# Patient Record
Sex: Male | Born: 1945 | Race: White | Hispanic: No | Marital: Single | Smoking: Former smoker
Health system: Southern US, Community
[De-identification: ages and names within clinical notes are randomized; demographics above are authoritative.]

## PROBLEM LIST (undated history)

## (undated) DIAGNOSIS — E113293 Type 2 diabetes mellitus with mild nonproliferative diabetic retinopathy without macular edema, bilateral: Secondary | ICD-10-CM

## (undated) DIAGNOSIS — Z7901 Long term (current) use of anticoagulants: Secondary | ICD-10-CM

## (undated) DIAGNOSIS — I351 Nonrheumatic aortic (valve) insufficiency: Secondary | ICD-10-CM

## (undated) DIAGNOSIS — N401 Enlarged prostate with lower urinary tract symptoms: Secondary | ICD-10-CM

## (undated) DIAGNOSIS — H9313 Tinnitus, bilateral: Secondary | ICD-10-CM

## (undated) DIAGNOSIS — I340 Nonrheumatic mitral (valve) insufficiency: Secondary | ICD-10-CM

## (undated) DIAGNOSIS — Z973 Presence of spectacles and contact lenses: Secondary | ICD-10-CM

## (undated) DIAGNOSIS — I444 Left anterior fascicular block: Secondary | ICD-10-CM

## (undated) DIAGNOSIS — I4891 Unspecified atrial fibrillation: Secondary | ICD-10-CM

## (undated) DIAGNOSIS — E782 Mixed hyperlipidemia: Secondary | ICD-10-CM

## (undated) DIAGNOSIS — I480 Paroxysmal atrial fibrillation: Secondary | ICD-10-CM

## (undated) DIAGNOSIS — I452 Bifascicular block: Secondary | ICD-10-CM

## (undated) DIAGNOSIS — E119 Type 2 diabetes mellitus without complications: Secondary | ICD-10-CM

## (undated) DIAGNOSIS — I441 Atrioventricular block, second degree: Secondary | ICD-10-CM

## (undated) DIAGNOSIS — I472 Ventricular tachycardia, unspecified: Secondary | ICD-10-CM

## (undated) DIAGNOSIS — I4729 Other ventricular tachycardia: Secondary | ICD-10-CM

## (undated) DIAGNOSIS — R011 Cardiac murmur, unspecified: Secondary | ICD-10-CM

## (undated) DIAGNOSIS — I10 Essential (primary) hypertension: Secondary | ICD-10-CM

## (undated) DIAGNOSIS — I2510 Atherosclerotic heart disease of native coronary artery without angina pectoris: Secondary | ICD-10-CM

## (undated) DIAGNOSIS — I4510 Unspecified right bundle-branch block: Secondary | ICD-10-CM

## (undated) DIAGNOSIS — E785 Hyperlipidemia, unspecified: Secondary | ICD-10-CM

## (undated) HISTORY — DX: Unspecified right bundle-branch block: I45.10

## (undated) HISTORY — PX: LUMBAR DISC SURGERY: SHX700

## (undated) HISTORY — DX: Nonrheumatic aortic (valve) insufficiency: I35.1

## (undated) HISTORY — DX: Essential (primary) hypertension: I10

## (undated) HISTORY — DX: Ventricular tachycardia, unspecified: I47.20

## (undated) HISTORY — DX: Atrioventricular block, second degree: I44.1

## (undated) HISTORY — DX: Hyperlipidemia, unspecified: E78.5

## (undated) HISTORY — DX: Left anterior fascicular block: I44.4

## (undated) HISTORY — DX: Type 2 diabetes mellitus without complications: E11.9

## (undated) HISTORY — DX: Nonrheumatic mitral (valve) insufficiency: I34.0

## (undated) HISTORY — DX: Unspecified atrial fibrillation: I48.91

## (undated) HISTORY — DX: Ventricular tachycardia: I47.2

## (undated) HISTORY — PX: BACK SURGERY: SHX140

## (undated) HISTORY — DX: Other ventricular tachycardia: I47.29

---

## 2004-03-17 ENCOUNTER — Ambulatory Visit (HOSPITAL_COMMUNITY): Admission: RE | Admit: 2004-03-17 | Discharge: 2004-03-17 | Payer: Self-pay | Admitting: Orthopedic Surgery

## 2004-11-29 ENCOUNTER — Ambulatory Visit (HOSPITAL_COMMUNITY): Admission: RE | Admit: 2004-11-29 | Discharge: 2004-11-29 | Payer: Self-pay | Admitting: Orthopedic Surgery

## 2007-02-28 HISTORY — PX: NM MYOCAR PERF WALL MOTION: HXRAD629

## 2010-06-11 DIAGNOSIS — Z8679 Personal history of other diseases of the circulatory system: Secondary | ICD-10-CM

## 2010-06-11 DIAGNOSIS — I441 Atrioventricular block, second degree: Secondary | ICD-10-CM

## 2010-06-11 DIAGNOSIS — I252 Old myocardial infarction: Secondary | ICD-10-CM

## 2010-06-11 HISTORY — DX: Old myocardial infarction: I25.2

## 2010-06-11 HISTORY — DX: Atrioventricular block, second degree: I44.1

## 2010-06-11 HISTORY — DX: Personal history of other diseases of the circulatory system: Z86.79

## 2010-06-28 ENCOUNTER — Inpatient Hospital Stay (HOSPITAL_COMMUNITY): Admission: EM | Admit: 2010-06-28 | Discharge: 2010-07-02 | Payer: Self-pay | Admitting: Pediatrics

## 2010-06-30 HISTORY — PX: CARDIAC CATHETERIZATION: SHX172

## 2010-07-21 ENCOUNTER — Ambulatory Visit (HOSPITAL_COMMUNITY): Admission: RE | Admit: 2010-07-21 | Discharge: 2010-07-22 | Payer: Self-pay | Admitting: Cardiovascular Disease

## 2010-07-21 DIAGNOSIS — Z950 Presence of cardiac pacemaker: Secondary | ICD-10-CM

## 2010-07-21 HISTORY — DX: Presence of cardiac pacemaker: Z95.0

## 2010-07-21 HISTORY — PX: PERMANENT PACEMAKER INSERTION: SHX6023

## 2010-09-14 ENCOUNTER — Ambulatory Visit
Admission: RE | Admit: 2010-09-14 | Discharge: 2010-09-14 | Payer: Self-pay | Source: Home / Self Care | Attending: Cardiothoracic Surgery | Admitting: Cardiothoracic Surgery

## 2010-09-28 ENCOUNTER — Ambulatory Visit
Admission: RE | Admit: 2010-09-28 | Discharge: 2010-09-28 | Payer: Self-pay | Source: Home / Self Care | Attending: Cardiothoracic Surgery | Admitting: Cardiothoracic Surgery

## 2010-10-05 LAB — CBC
HCT: 47.9 % (ref 39.0–52.0)
Hemoglobin: 16 g/dL (ref 13.0–17.0)
MCH: 30 pg (ref 26.0–34.0)
MCHC: 33.4 g/dL (ref 30.0–36.0)
MCV: 89.9 fL (ref 78.0–100.0)
Platelets: 210 10*3/uL (ref 150–400)
RBC: 5.33 MIL/uL (ref 4.22–5.81)
RDW: 12.9 % (ref 11.5–15.5)
WBC: 7.4 10*3/uL (ref 4.0–10.5)

## 2010-10-05 LAB — URINALYSIS, ROUTINE W REFLEX MICROSCOPIC
Hgb urine dipstick: NEGATIVE
Ketones, ur: 15 mg/dL — AB
Nitrite: NEGATIVE
Protein, ur: NEGATIVE mg/dL
Specific Gravity, Urine: 1.027 (ref 1.005–1.030)
Urine Glucose, Fasting: NEGATIVE mg/dL
Urobilinogen, UA: 1 mg/dL (ref 0.0–1.0)
pH: 6 (ref 5.0–8.0)

## 2010-10-05 LAB — PROTIME-INR
INR: 1.12 (ref 0.00–1.49)
Prothrombin Time: 14.6 seconds (ref 11.6–15.2)

## 2010-10-05 LAB — BLOOD GAS, ARTERIAL
Acid-base deficit: 0.3 mmol/L (ref 0.0–2.0)
Bicarbonate: 23.6 mEq/L (ref 20.0–24.0)
Drawn by: 206361
FIO2: 0.21 %
O2 Saturation: 96.7 %
Patient temperature: 98.6
TCO2: 24.7 mmol/L (ref 0–100)
pCO2 arterial: 37.2 mmHg (ref 35.0–45.0)
pH, Arterial: 7.419 (ref 7.350–7.450)
pO2, Arterial: 82.6 mmHg (ref 80.0–100.0)

## 2010-10-05 LAB — SURGICAL PCR SCREEN
MRSA, PCR: NEGATIVE
Staphylococcus aureus: NEGATIVE

## 2010-10-05 LAB — APTT: aPTT: 31 seconds (ref 24–37)

## 2010-10-05 LAB — COMPREHENSIVE METABOLIC PANEL
ALT: 19 U/L (ref 0–53)
AST: 27 U/L (ref 0–37)
Albumin: 4.1 g/dL (ref 3.5–5.2)
Alkaline Phosphatase: 51 U/L (ref 39–117)
BUN: 13 mg/dL (ref 6–23)
CO2: 21 mEq/L (ref 19–32)
Calcium: 9.2 mg/dL (ref 8.4–10.5)
Chloride: 109 mEq/L (ref 96–112)
Creatinine, Ser: 0.82 mg/dL (ref 0.4–1.5)
GFR calc Af Amer: >60 mL/min (ref >60)
GFR calc non Af Amer: >60 mL/min (ref >60)
Glucose, Bld: 83 mg/dL (ref 70–99)
Potassium: 4.2 mEq/L (ref 3.5–5.1)
Sodium: 140 mEq/L (ref 135–145)
Total Bilirubin: 0.8 mg/dL (ref 0.3–1.2)
Total Protein: 6.5 g/dL (ref 6.0–8.3)

## 2010-10-05 LAB — ABO/RH: ABO/RH(D): A POS

## 2010-10-05 LAB — HEMOGLOBIN A1C
Hgb A1c MFr Bld: 5.5 % (ref <5.7)
Mean Plasma Glucose: 111 mg/dL (ref <117)

## 2010-10-06 ENCOUNTER — Inpatient Hospital Stay (HOSPITAL_COMMUNITY)
Admission: RE | Admit: 2010-10-06 | Discharge: 2010-10-11 | Payer: Self-pay | Source: Home / Self Care | Attending: Cardiothoracic Surgery | Admitting: Cardiothoracic Surgery

## 2010-10-06 ENCOUNTER — Ambulatory Visit
Admission: RE | Admit: 2010-10-06 | Discharge: 2010-10-06 | Payer: Self-pay | Source: Home / Self Care | Attending: Cardiothoracic Surgery | Admitting: Cardiothoracic Surgery

## 2010-10-06 HISTORY — PX: AORTIC VALVE REPLACEMENT: SHX41

## 2010-10-06 LAB — POCT I-STAT 3, ART BLOOD GAS (G3+)
Acid-base deficit: 2 mmol/L (ref 0.0–2.0)
Acid-base deficit: 1 mmol/L (ref 0.0–2.0)
Acid-base deficit: 4 mmol/L — ABNORMAL HIGH (ref 0.0–2.0)
Acid-base deficit: 3 mmol/L — ABNORMAL HIGH (ref 0.0–2.0)
Acid-base deficit: 1 mmol/L (ref 0.0–2.0)
Bicarbonate: 23.9 mEq/L (ref 20.0–24.0)
Bicarbonate: 24.9 mEq/L — ABNORMAL HIGH (ref 20.0–24.0)
Bicarbonate: 22.1 mEq/L (ref 20.0–24.0)
Bicarbonate: 22.4 mEq/L (ref 20.0–24.0)
Bicarbonate: 24.6 mEq/L — ABNORMAL HIGH (ref 20.0–24.0)
O2 Saturation: 100 %
O2 Saturation: 100 %
O2 Saturation: 92 %
O2 Saturation: 98 %
O2 Saturation: 98 %
Patient temperature: 35.1
Patient temperature: 38.5
Patient temperature: 38.4
TCO2: 25 mmol/L (ref 0–100)
TCO2: 26 mmol/L (ref 0–100)
TCO2: 23 mmol/L (ref 0–100)
TCO2: 24 mmol/L (ref 0–100)
TCO2: 26 mmol/L (ref 0–100)
pCO2 arterial: 42.9 mmHg (ref 35.0–45.0)
pCO2 arterial: 43.8 mmHg (ref 35.0–45.0)
pCO2 arterial: 38.1 mmHg (ref 35.0–45.0)
pCO2 arterial: 43.4 mmHg (ref 35.0–45.0)
pCO2 arterial: 45.1 mmHg — ABNORMAL HIGH (ref 35.0–45.0)
pH, Arterial: 7.354 (ref 7.350–7.450)
pH, Arterial: 7.363 (ref 7.350–7.450)
pH, Arterial: 7.363 (ref 7.350–7.450)
pH, Arterial: 7.329 — ABNORMAL LOW (ref 7.350–7.450)
pH, Arterial: 7.351 (ref 7.350–7.450)
pO2, Arterial: 331 mmHg — ABNORMAL HIGH (ref 80.0–100.0)
pO2, Arterial: 238 mmHg — ABNORMAL HIGH (ref 80.0–100.0)
pO2, Arterial: 61 mmHg — ABNORMAL LOW (ref 80.0–100.0)
pO2, Arterial: 115 mmHg — ABNORMAL HIGH (ref 80.0–100.0)
pO2, Arterial: 111 mmHg — ABNORMAL HIGH (ref 80.0–100.0)

## 2010-10-06 LAB — CBC
HCT: 38.2 % — ABNORMAL LOW (ref 39.0–52.0)
HCT: 30.9 % — ABNORMAL LOW (ref 39.0–52.0)
Hemoglobin: 12.8 g/dL — ABNORMAL LOW (ref 13.0–17.0)
Hemoglobin: 10.5 g/dL — ABNORMAL LOW (ref 13.0–17.0)
MCH: 30 pg (ref 26.0–34.0)
MCH: 30.9 pg (ref 26.0–34.0)
MCHC: 33.5 g/dL (ref 30.0–36.0)
MCHC: 34 g/dL (ref 30.0–36.0)
MCV: 89.7 fL (ref 78.0–100.0)
MCV: 90.9 fL (ref 78.0–100.0)
Platelets: 126 10*3/uL — ABNORMAL LOW (ref 150–400)
Platelets: 134 10*3/uL — ABNORMAL LOW (ref 150–400)
RBC: 4.26 MIL/uL (ref 4.22–5.81)
RBC: 3.4 MIL/uL — ABNORMAL LOW (ref 4.22–5.81)
RDW: 12.8 % (ref 11.5–15.5)
RDW: 12.9 % (ref 11.5–15.5)
WBC: 11.8 10*3/uL — ABNORMAL HIGH (ref 4.0–10.5)
WBC: 12 10*3/uL — ABNORMAL HIGH (ref 4.0–10.5)

## 2010-10-06 LAB — POCT I-STAT 4, (NA,K, GLUC, HGB,HCT)
Glucose, Bld: 133 mg/dL — ABNORMAL HIGH (ref 70–99)
Glucose, Bld: 110 mg/dL — ABNORMAL HIGH (ref 70–99)
Glucose, Bld: 95 mg/dL (ref 70–99)
Glucose, Bld: 110 mg/dL — ABNORMAL HIGH (ref 70–99)
Glucose, Bld: 126 mg/dL — ABNORMAL HIGH (ref 70–99)
Glucose, Bld: 117 mg/dL — ABNORMAL HIGH (ref 70–99)
HCT: 43 % (ref 39.0–52.0)
HCT: 33 % — ABNORMAL LOW (ref 39.0–52.0)
HCT: 39 % (ref 39.0–52.0)
HCT: 33 % — ABNORMAL LOW (ref 39.0–52.0)
HCT: 32 % — ABNORMAL LOW (ref 39.0–52.0)
HCT: 37 % — ABNORMAL LOW (ref 39.0–52.0)
Hemoglobin: 14.6 g/dL (ref 13.0–17.0)
Hemoglobin: 11.2 g/dL — ABNORMAL LOW (ref 13.0–17.0)
Hemoglobin: 13.3 g/dL (ref 13.0–17.0)
Hemoglobin: 11.2 g/dL — ABNORMAL LOW (ref 13.0–17.0)
Hemoglobin: 10.9 g/dL — ABNORMAL LOW (ref 13.0–17.0)
Hemoglobin: 12.6 g/dL — ABNORMAL LOW (ref 13.0–17.0)
Potassium: 3.7 mEq/L (ref 3.5–5.1)
Potassium: 3.3 mEq/L — ABNORMAL LOW (ref 3.5–5.1)
Potassium: 3.6 mEq/L (ref 3.5–5.1)
Potassium: 4.3 mEq/L (ref 3.5–5.1)
Potassium: 4.4 mEq/L (ref 3.5–5.1)
Potassium: 4 mEq/L (ref 3.5–5.1)
Sodium: 140 mEq/L (ref 135–145)
Sodium: 136 mEq/L (ref 135–145)
Sodium: 140 mEq/L (ref 135–145)
Sodium: 138 mEq/L (ref 135–145)
Sodium: 139 mEq/L (ref 135–145)
Sodium: 141 mEq/L (ref 135–145)

## 2010-10-06 LAB — CREATININE, SERUM
Creatinine, Ser: 0.79 mg/dL (ref 0.4–1.5)
GFR calc Af Amer: >60 mL/min (ref >60)
GFR calc non Af Amer: >60 mL/min (ref >60)

## 2010-10-06 LAB — POCT I-STAT, CHEM 8
BUN: 8 mg/dL (ref 6–23)
Calcium, Ion: 1.1 mmol/L — ABNORMAL LOW (ref 1.12–1.32)
Chloride: 108 mEq/L (ref 96–112)
Creatinine, Ser: 0.7 mg/dL (ref 0.4–1.5)
Glucose, Bld: 149 mg/dL — ABNORMAL HIGH (ref 70–99)
HCT: 30 % — ABNORMAL LOW (ref 39.0–52.0)
Hemoglobin: 10.2 g/dL — ABNORMAL LOW (ref 13.0–17.0)
Potassium: 4.4 mEq/L (ref 3.5–5.1)
Sodium: 141 mEq/L (ref 135–145)
TCO2: 22 mmol/L (ref 0–100)

## 2010-10-06 LAB — PROTIME-INR
INR: 1.63 — ABNORMAL HIGH (ref 0.00–1.49)
Prothrombin Time: 19.5 seconds — ABNORMAL HIGH (ref 11.6–15.2)

## 2010-10-06 LAB — MAGNESIUM: Magnesium: 3 mg/dL — ABNORMAL HIGH (ref 1.5–2.5)

## 2010-10-06 LAB — PLATELET COUNT: Platelets: 148 10*3/uL — ABNORMAL LOW (ref 150–400)

## 2010-10-06 LAB — HEMOGLOBIN AND HEMATOCRIT, BLOOD
HCT: 33.8 % — ABNORMAL LOW (ref 39.0–52.0)
Hemoglobin: 11.5 g/dL — ABNORMAL LOW (ref 13.0–17.0)

## 2010-10-06 LAB — APTT: aPTT: 39 seconds — ABNORMAL HIGH (ref 24–37)

## 2010-10-06 LAB — GLUCOSE, CAPILLARY: Glucose-Capillary: 116 mg/dL — ABNORMAL HIGH (ref 70–99)

## 2010-10-07 LAB — MAGNESIUM
Magnesium: 2.4 mg/dL (ref 1.5–2.5)
Magnesium: 2.3 mg/dL (ref 1.5–2.5)

## 2010-10-07 LAB — BASIC METABOLIC PANEL
BUN: 10 mg/dL (ref 6–23)
CO2: 22 mEq/L (ref 19–32)
Calcium: 8.2 mg/dL — ABNORMAL LOW (ref 8.4–10.5)
Chloride: 109 mEq/L (ref 96–112)
Creatinine, Ser: 0.91 mg/dL (ref 0.4–1.5)
GFR calc Af Amer: >60 mL/min (ref >60)
GFR calc non Af Amer: >60 mL/min (ref >60)
Glucose, Bld: 129 mg/dL — ABNORMAL HIGH (ref 70–99)
Potassium: 3.7 mEq/L (ref 3.5–5.1)
Sodium: 142 mEq/L (ref 135–145)

## 2010-10-07 LAB — PREPARE FRESH FROZEN PLASMA
Unit division: 0
Unit division: 0

## 2010-10-07 LAB — GLUCOSE, CAPILLARY
Glucose-Capillary: 115 mg/dL — ABNORMAL HIGH (ref 70–99)
Glucose-Capillary: 106 mg/dL — ABNORMAL HIGH (ref 70–99)
Glucose-Capillary: 119 mg/dL — ABNORMAL HIGH (ref 70–99)
Glucose-Capillary: 131 mg/dL — ABNORMAL HIGH (ref 70–99)
Glucose-Capillary: 135 mg/dL — ABNORMAL HIGH (ref 70–99)
Glucose-Capillary: 138 mg/dL — ABNORMAL HIGH (ref 70–99)
Glucose-Capillary: 118 mg/dL — ABNORMAL HIGH (ref 70–99)
Glucose-Capillary: 178 mg/dL — ABNORMAL HIGH (ref 70–99)
Glucose-Capillary: 131 mg/dL — ABNORMAL HIGH (ref 70–99)
Glucose-Capillary: 140 mg/dL — ABNORMAL HIGH (ref 70–99)
Glucose-Capillary: 106 mg/dL — ABNORMAL HIGH (ref 70–99)
Glucose-Capillary: 99 mg/dL (ref 70–99)

## 2010-10-07 LAB — POCT I-STAT, CHEM 8
BUN: 10 mg/dL (ref 6–23)
Calcium, Ion: 1.13 mmol/L (ref 1.12–1.32)
Chloride: 102 mEq/L (ref 96–112)
Creatinine, Ser: 0.8 mg/dL (ref 0.4–1.5)
Glucose, Bld: 140 mg/dL — ABNORMAL HIGH (ref 70–99)
HCT: 31 % — ABNORMAL LOW (ref 39.0–52.0)
Hemoglobin: 10.5 g/dL — ABNORMAL LOW (ref 13.0–17.0)
Potassium: 3.6 mEq/L (ref 3.5–5.1)
Sodium: 141 mEq/L (ref 135–145)
TCO2: 27 mmol/L (ref 0–100)

## 2010-10-07 LAB — CBC
HCT: 31.2 % — ABNORMAL LOW (ref 39.0–52.0)
HCT: 30.9 % — ABNORMAL LOW (ref 39.0–52.0)
Hemoglobin: 10.4 g/dL — ABNORMAL LOW (ref 13.0–17.0)
Hemoglobin: 10.4 g/dL — ABNORMAL LOW (ref 13.0–17.0)
MCH: 30.3 pg (ref 26.0–34.0)
MCH: 30.5 pg (ref 26.0–34.0)
MCHC: 33.3 g/dL (ref 30.0–36.0)
MCHC: 33.7 g/dL (ref 30.0–36.0)
MCV: 91 fL (ref 78.0–100.0)
MCV: 90.6 fL (ref 78.0–100.0)
Platelets: 145 10*3/uL — ABNORMAL LOW (ref 150–400)
Platelets: 134 10*3/uL — ABNORMAL LOW (ref 150–400)
RBC: 3.43 MIL/uL — ABNORMAL LOW (ref 4.22–5.81)
RBC: 3.41 MIL/uL — ABNORMAL LOW (ref 4.22–5.81)
RDW: 13.1 % (ref 11.5–15.5)
RDW: 13 % (ref 11.5–15.5)
WBC: 16.6 10*3/uL — ABNORMAL HIGH (ref 4.0–10.5)
WBC: 18.5 10*3/uL — ABNORMAL HIGH (ref 4.0–10.5)

## 2010-10-07 LAB — PREPARE PLATELETS: Unit division: 0

## 2010-10-07 LAB — TYPE AND SCREEN
ABO/RH(D): A POS
Antibody Screen: NEGATIVE

## 2010-10-07 LAB — CREATININE, SERUM
Creatinine, Ser: 0.77 mg/dL (ref 0.4–1.5)
GFR calc Af Amer: >60 mL/min (ref >60)
GFR calc non Af Amer: >60 mL/min (ref >60)

## 2010-10-08 LAB — BASIC METABOLIC PANEL
BUN: 10 mg/dL (ref 6–23)
CO2: 28 mEq/L (ref 19–32)
Calcium: 8.3 mg/dL — ABNORMAL LOW (ref 8.4–10.5)
Chloride: 105 mEq/L (ref 96–112)
Creatinine, Ser: 0.75 mg/dL (ref 0.4–1.5)
GFR calc Af Amer: >60 mL/min (ref >60)
GFR calc non Af Amer: >60 mL/min (ref >60)
Glucose, Bld: 105 mg/dL — ABNORMAL HIGH (ref 70–99)
Potassium: 3.7 mEq/L (ref 3.5–5.1)
Sodium: 139 mEq/L (ref 135–145)

## 2010-10-08 LAB — GLUCOSE, CAPILLARY
Glucose-Capillary: 96 mg/dL (ref 70–99)
Glucose-Capillary: 126 mg/dL — ABNORMAL HIGH (ref 70–99)
Glucose-Capillary: 52 mg/dL — ABNORMAL LOW (ref 70–99)
Glucose-Capillary: 62 mg/dL — ABNORMAL LOW (ref 70–99)
Glucose-Capillary: 103 mg/dL — ABNORMAL HIGH (ref 70–99)
Glucose-Capillary: 75 mg/dL (ref 70–99)

## 2010-10-08 LAB — CBC
HCT: 30.9 % — ABNORMAL LOW (ref 39.0–52.0)
Hemoglobin: 10.3 g/dL — ABNORMAL LOW (ref 13.0–17.0)
MCH: 30.2 pg (ref 26.0–34.0)
MCHC: 33.3 g/dL (ref 30.0–36.0)
MCV: 90.6 fL (ref 78.0–100.0)
Platelets: 130 10*3/uL — ABNORMAL LOW (ref 150–400)
RBC: 3.41 MIL/uL — ABNORMAL LOW (ref 4.22–5.81)
RDW: 13 % (ref 11.5–15.5)
WBC: 16.8 10*3/uL — ABNORMAL HIGH (ref 4.0–10.5)

## 2010-10-09 LAB — CBC
HCT: 33.3 % — ABNORMAL LOW (ref 39.0–52.0)
Hemoglobin: 11.2 g/dL — ABNORMAL LOW (ref 13.0–17.0)
MCH: 30.3 pg (ref 26.0–34.0)
MCHC: 33.6 g/dL (ref 30.0–36.0)
MCV: 90 fL (ref 78.0–100.0)
Platelets: 180 10*3/uL (ref 150–400)
RBC: 3.7 MIL/uL — ABNORMAL LOW (ref 4.22–5.81)
RDW: 12.9 % (ref 11.5–15.5)
WBC: 13.5 10*3/uL — ABNORMAL HIGH (ref 4.0–10.5)

## 2010-10-09 LAB — BASIC METABOLIC PANEL
BUN: 9 mg/dL (ref 6–23)
CO2: 25 mEq/L (ref 19–32)
Calcium: 8 mg/dL — ABNORMAL LOW (ref 8.4–10.5)
Chloride: 98 mEq/L (ref 96–112)
Creatinine, Ser: 0.79 mg/dL (ref 0.4–1.5)
GFR calc Af Amer: >60 mL/min (ref >60)
GFR calc non Af Amer: >60 mL/min (ref >60)
Glucose, Bld: 161 mg/dL — ABNORMAL HIGH (ref 70–99)
Potassium: 4.1 mEq/L (ref 3.5–5.1)
Sodium: 131 mEq/L — ABNORMAL LOW (ref 135–145)

## 2010-10-09 LAB — GLUCOSE, CAPILLARY
Glucose-Capillary: 58 mg/dL — ABNORMAL LOW (ref 70–99)
Glucose-Capillary: 170 mg/dL — ABNORMAL HIGH (ref 70–99)
Glucose-Capillary: 109 mg/dL — ABNORMAL HIGH (ref 70–99)
Glucose-Capillary: 77 mg/dL (ref 70–99)
Glucose-Capillary: 109 mg/dL — ABNORMAL HIGH (ref 70–99)

## 2010-10-09 LAB — PROTIME-INR
INR: 1.28 (ref 0.00–1.49)
Prothrombin Time: 16.2 seconds — ABNORMAL HIGH (ref 11.6–15.2)

## 2010-10-10 LAB — GLUCOSE, CAPILLARY
Glucose-Capillary: 124 mg/dL — ABNORMAL HIGH (ref 70–99)
Glucose-Capillary: 86 mg/dL (ref 70–99)

## 2010-10-10 LAB — PROTIME-INR
INR: 1.4 (ref 0.00–1.49)
Prothrombin Time: 17.4 seconds — ABNORMAL HIGH (ref 11.6–15.2)

## 2010-10-11 LAB — GLUCOSE, CAPILLARY
Glucose-Capillary: 90 mg/dL (ref 70–99)
Glucose-Capillary: 73 mg/dL (ref 70–99)
Glucose-Capillary: 113 mg/dL — ABNORMAL HIGH (ref 70–99)

## 2010-10-11 LAB — PROTIME-INR
INR: 1.44 (ref 0.00–1.49)
Prothrombin Time: 17.7 seconds — ABNORMAL HIGH (ref 11.6–15.2)

## 2010-10-11 NOTE — Op Note (Signed)
[name] [name], [name] [name] NO.:  [id number]  MEDICAL RECORD NO.:  [id number]          PATIENT TYPE:  INP  LOCATION:  2301                         FACILITY:  [facility]  PHYSICIAN:  Kerin Perna, M.D.  DATE OF BIRTH:  1946-03-04  DATE OF PROCEDURE:  10/06/2010 DATE OF DISCHARGE:                              OPERATIVE REPORT   OPERATION:  Aortic valve replacement with a 21-mm pericardial bioprosthesis [facility], serial number [id number], model 3300TFX).  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  POSTOPERATIVE DIAGNOSIS:  Severe aortic stenosis with a class III congestive heart failure, bicuspid aortic valve.  ANESTHESIA:  General.  INDICATIONS:  The patient is a 65 year old diabetic with a long murmur of aortic stenosis followed with serial 2-D echoes.  He recently hadincreased symptoms of dyspnea on exertion and an episode of syncope.  A echo performed in late 2011 showed his gradient to be 70 mmHg with a valve area of 0.7.  After he underwent a transvenous permanent pacemaker, he was felt to be a candidate for aortic valve replacement. I discussed the results of his cath that showed no significant coronary disease and his 2-D echo which showed severe aortic stenosis with the patient and his family.  I discussed the indications, benefits, and risks of aortic valve replacement for treatment of his severe aortic stenosis.  I reviewed the alternatives to surgical therapy as well.  He understood the plan to place a bioprosthetic valve to avoid long-term commitment to Coumadin anticoagulation at age 88.  He understood the associated risks of the operation including risks of stroke, MI, bleeding, blood transfusion requirement, infection, and death.  After reviewing these issues, he demonstrated his understanding and agreed  to proceed with the surgery under what I felt was an informed consent.  OPERATIVE FINDINGS: 1. Severe bicuspid aortic stenosis. 2. Severe LVH secondary to aortic stenosis. 3. No blood products were required for this operation. 4. Normal functioning bioprosthetic aortic valve by TEE at termination     of procedure with baseline mild central mitral regurgitation.  PROCEDURE IN DETAIL:  The patient was brought to operating room, placed supine on the operating table, and general anesthesia was induced.  The transesophageal 2-D echo probe was placed by the anesthesiologist.  The chest, abdomen, and legs were prepped with Betadine and draped as a sterile field.  A sternal incision was made and a sternal saw was used to divide the sternum.  The sternum was retracted and the pericardium was opened and suspended.  Heparin was administered and pursestrings were placed in the ascending aorta and right atrium.  After the ACT was documented as being therapeutic, the patient was cannulated and placed on cardiopulmonary bypass.  A left ventricular vent was placed via the right superior pulmonary vein.  Cardioplegia catheters were placed for both antegrade aortic and retrograde coronary sinus cardioplegia.  The patient was cooled to 32 degrees and the aortic crossclamp was applied. 800 mL of cold blood cardioplegia was delivered in  split doses between the antegrade aortic and retrograde coronary sinus catheters.  There was a good cardioplegic arrest and septal temperature dropped less than 15 degrees.  Cardioplegia was delivered every 20 minutes or less while the crossclamp was in place.  A transverse aortotomy was performed and the aortic valve was inspected. It was bicuspid, calcified, stenotic, and mobile.  It was resected using the rongeurs as well to remove the annular calcium.  There was a significant amount of calcium on the anterior leaflet of the mitral valve and some of this was removed in  order to better define the annulus.  After extended period of careful dissection, the annulus was felt to be adequately debrided and it was irrigated with copious amounts of cold saline.  The annulus was sized to 21-mm pericardial valve for the Magna-Ease prosthesis.  After cardioplegia was redosed, subannular 2- 0 pledgeted Ethibond sutures were placed around the annulus numbering 17 in total.  The valve was prepared according to the protocol.  The valve sutures were placed through the sewing ring and the valve was seated and the sutures were tied.  There was good confirmation of the valve annulus to the patient's annulus and there was no evidence of perivalvular spacing for potential leak.  The coronary ostia were widely patent.  The aortotomy was then closed with a running 2-layer technique of 4-0 Prolene with pledgeted sutures at the end of the incision.  Air was vented from the coronaries with a dose of retrograde warm blood cardioplegia as usual de-airing maneuvers were made and the crossclamp was removed.  The heart was cardioverted back to a regular rhythm.  The cardioplegia catheters were removed.  The patient was rewarmed and reperfused. Temporary pacing wires were applied.  The patient was AV sequentially paced at 90.  The lungs were re-expanded.  The ventilator was resumed. The patient was weaned from bypass without needing inotropes.  Blood pressure and cardiac output were stable.  Protamine was administered without adverse reaction.  The 2-D echo showed good function of the aortic valve without AI.  The patient remained hemodynamically stable. The cardiac output between 5 and 8 L per minute.  The heparin was neutralized with the protamine.  The mediastinum was irrigated warm saline.  The superior pericardial fat was closed over the aorta and right ventricle.  Two mediastinal chest tubes were placed and brought out through separate incisions.  Sternum was closed with  interrupted steel wire.  The pectoralis fascia was closed in a running #1 Vicryl. Subcutaneous and skin layers were closed using running Vicryl and sterile dressings were applied.  Total cardiopulmonary bypass time was 101 minutes.     Kerin Perna, M.D.     PV/MEDQ  D:  10/06/2010  T:  10/07/2010  Job:  [id number]  cc:   Thurmon Fair, MD [facility] office  Electronically Signed by Kerin Perna M.D. on 10/11/2010 01:30:45 PM

## 2010-10-17 NOTE — Discharge Summary (Signed)
  NAMETYREIK, DELAHOUSSAYE NO.:  [id number]  MEDICAL RECORD NO.:  [id number]          PATIENT TYPE:  INP  LOCATION:  2023                         FACILITY:  [facility]  PHYSICIAN:  Kerin Perna, M.D.  DATE OF BIRTH:  01/09/1946  DATE OF ADMISSION:  10/06/2010 DATE OF DISCHARGE:  10/11/2010                              DISCHARGE SUMMARY   ADDENDUM  This is an addendum to previously dictated discharge summary.  There have been no changes from the previously dictated discharge summary in terms of the patient's condition, however, his discharge medication list is incorrect.  The corrected list of discharge medications: 1. Lopressor 25 mg b.i.d. 2. Oxycodone IR 5-10 mg q.4-6 h. p.r.n. for pain. 3. Coumadin 2.5 mg daily or as directed. 4. Aspirin 81 mg daily. 5. Diovan 80 mg daily. 6. Fish oil 1000 mg daily. 7. Glipizide/metformin 5/500 b.i.d. 8. Simvastatin 80 mg daily.  Discharge instructions and followups are unchanged from previously dictated discharge summary.     Coral Ceo, P.A.   ______________________________ Kerin Perna, M.D.    GC/MEDQ  D:  10/11/2010  T:  10/11/2010  Job:  [id number]  cc:   [facility] Office Thurmon Fair, MD  Electronically Signed by Weldon Inches. on 10/13/2010 01:12:10 PM Electronically Signed by Kerin Perna M.D. on 10/17/2010 11:39:22 AM

## 2010-10-17 NOTE — Discharge Summary (Signed)
[name], [name] NO.:  [id number]  MEDICAL RECORD NO.:  [id number]          PATIENT TYPE:  INP  LOCATION:  2023                         FACILITY:  [facility]  PHYSICIAN:  Kerin Perna, M.D.  DATE OF BIRTH:  [birth year]  DATE OF ADMISSION:  10/06/2010 DATE OF DISCHARGE:  10/11/2010                              DISCHARGE SUMMARY   HISTORY:  The patient is a 65 year old white male diabetic nonsmoker who was recently diagnosed with severe aortic stenosis and referred to Kerin Perna, MD for surgical consideration.  The patient has had a murmur since adolescence, felt to be related to a bicuspid valve, but has been asymptomatic until recently.  This past fall, he had a syncopal episode and was found by monitor to have a second-degree heart block and a transvenous dual-chamber pacemaker was placed.  Serial 2-D echocardiograms over the past few years have shown any increase in aortic stenosis with a recent aortic valve area measured at 0.9 with a peak gradient of 70 mmHg and a mean gradient of 39 mmHg.  The patient also has mild aortic insufficiency.  His left ventricular ejection fraction by echocardiogram was normal.  The patient subsequently underwent a left and right heart catheterization by Dr. [name], which showed no significant coronary disease with PA pressures measured at 40/20 and a cardiac output of 4.90 L per minute.  Wedge pressure was 21 and his ejection fraction was normal.  He was recommended to undergo aortic valve replacement.  He was cleared preoperatively by Dr. Rollen Sox from a dental viewpoint.  He was admitted this hospitalization for the procedure.  PAST MEDICAL HISTORY: 1. Diabetes mellitus type 2. 2. Hypertension. 3. Previous tobacco abuse. 4. Dyslipidemia.  ALLERGIES:  No known drug allergies.  MEDICATIONS PRIOR TO ADMISSION: 1. Actos 30 mg q.a.m. 2. Diovan 80 mg daily. 3. Glipizide/metformin 5/500 mg 1 tablet b.i.d. 4.  Metoprolol 12.5 mg b.i.d. 5. Simvastatin 80 mg daily. 6. Aspirin 81 mg daily.  SOCIAL HISTORY:  The patient quit smoking over 10 years ago.  He is retired from the [employer].  He denies alcohol intake.  REVIEW OF SYMPTOMS AND PHYSICAL EXAMINATION:  Please see the history and physical done at the time of admission.  HOSPITAL COURSE:  The patient was admitted electively and on October 06, 2010, he underwent the following procedure:  Aortic valve replacement with a 21-mm pericardial bioprosthesis [facility] Ease serial number [id number], model number 3300, TFX.  This procedure was performed by Dr. Kathlee Nations Trigt, tolerated well.  Operative findings included the following: 1. Severe bicuspid aortic stenosis. 2. Severe left ventricular hypertrophy secondary to aortic stenosis. 3. No blood products were required for this operation. 4. Normal functioning bioprosthetic aortic valve by transesophageal     echocardiogram at the termination of the procedure with baseline     mild central mitral regurgitation.  POSTOPERATIVE HOSPITAL COURSE:  The patient has done quite well postoperatively.  He has maintained stable hemodynamics.  He was weaned from the ventilator without difficulty.  He did have some increasing early chest tube output and was treated with fresh  frozen plasma, platelets, and increased PEEP on the ventilator.  This improved over time and all routine lines, monitors, and drainage devices have been discontinued in the standard fashion.  He does have a moderate postoperative volume overload, but is responding well to diuretics, and is currently at his preoperative weight.  His incisions are healing well.  His diabetes has been done under good control using standard postoperative protocols and resumption of his home medications.  His oxygen has been weaned and he maintained good saturations on room air. He has been started on Coumadin postoperatively.  His most recent  PT/INR dated October 10, 2010, is 17.4 and 1.40 respectively.  His blood pressure has been under good control.  He is tolerating gradual increase in activity using standard postoperative protocols.  His postoperative anemia has stabilized.  His most recent hemoglobin dated October 09, 2010, is 11.3.  Electrolytes, BUN, and creatinine are within normal limits.  Overall, the patient's status is felt to be tentatively stable for discharge in the morning of October 11, 2010, pending morning round reevaluation.  MEDICATIONS ON DISCHARGE:  At the time of this dictation include the following: 1. Lopressor 25 mg p.o. twice daily. 2. Benicar 10 mg daily. 3. Oxycodone 5 mg IR tablet one to two every 4-6 hours as needed for     pain. 4. Crestor 20 mg daily. 5. Coumadin 2.5 mg daily and as directed through the Cou[facility] Clinic     at [facility]. 6. Aspirin 81 mg daily. 7. Fish oil 1000 mg daily. 8. Glipizide/metformin 5/500 mg tablets twice daily.  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  OTHER DIAGNOSES: 1. Postoperative acute blood loss anemia, mild. 2. Postoperative volume overload, mild. 3. No postoperative cardiac dysrhythmias. 4. Diabetes mellitus type 2. 5. Hypertension. 6. Reformed smoker. 7. Dyslipidemia.  INSTRUCTIONS:  The patient will receive written instructions regarding medications, activity, diet, wound care, and followup.  Followup include Dr. Donata Clay on October 31, 2010, at 12:30 with a chest x-ray. Additionally, he is instructed to follow up with Dr. [name] in 2 weeks as well as follow up monitoring of his Coumadin with first INR to be checked on October 14, 2010.     Rowe Clack, P.A.-C.   ______________________________ Kerin Perna, M.D.    Sherryll Burger  D:  10/10/2010  T:  10/11/2010  Job:  [id number]  cc:    Thurmon Fair, MD  Electronically Signed by Gershon Crane P.A.-C. on 10/13/2010 01:09:10 PM Electronically Signed by Kerin Perna M.D. on 10/17/2010 11:39:18 AM

## 2010-10-27 ENCOUNTER — Other Ambulatory Visit: Payer: Self-pay | Admitting: Cardiothoracic Surgery

## 2010-10-27 DIAGNOSIS — I359 Nonrheumatic aortic valve disorder, unspecified: Secondary | ICD-10-CM

## 2010-10-31 ENCOUNTER — Encounter (INDEPENDENT_AMBULATORY_CARE_PROVIDER_SITE_OTHER): Payer: Self-pay

## 2010-10-31 ENCOUNTER — Ambulatory Visit
Admission: RE | Admit: 2010-10-31 | Discharge: 2010-10-31 | Disposition: A | Discharge status: Home / Self Care | Payer: Medicare Other | Source: Ambulatory Visit | Attending: Cardiothoracic Surgery | Admitting: Cardiothoracic Surgery

## 2010-10-31 DIAGNOSIS — I359 Nonrheumatic aortic valve disorder, unspecified: Secondary | ICD-10-CM

## 2010-10-31 NOTE — Assessment & Plan Note (Unsigned)
OFFICE VISIT  [name] [name], [name] [name] DOB:  Oct 21, 1945                                        October 31, 2010 CHART #:  [id number]  REASON FOR OFFICE VISIT:  Routine followup status post AVR.  HISTORY OF PRESENT ILLNESS:  This is a very pleasant 65 year old Caucasian male who had a history of aortic stenosis, who had developed increased symptoms of dyspnea on exertion with an episode of syncope. This past fall, he was found to have second-degree heart block and required a transvenous dual-chamber pacemaker.  Serial 2-D echocardiogram showed worsening aortic stenosis.  As a result, the patient underwent aortic valve replacement [facility] Ease pericardial tissue valve, 21 mm) by Dr. Donata Clay on October 06, 2010. He was placed on Coumadin which will be continued for a couple of months postoperatively.  He was surgically stable for discharge on October 11, 2010.  Currently, the patient states that his only complaint is occasional fatigue.  He denies any shortness of breath, chest pain, fever, or chills.  PHYSICAL EXAMINATION:  General:  This is a pleasant 65 year old Caucasian male, who is in no acute distress, who is alert, oriented, and cooperative.  Vital Signs:  Latest vital signs are as follows; heart rate 84, BP 152/88, respiratory rate 18, and O2 sat 97% on room air. Cardiovascular:  Regular rate and rhythm.  S1 and S2 with a slight flow murmur.  Lungs:  Clear to auscultation bilaterally.  No rales, wheezes, or rhonchi.  Abdomen:  Soft and nontender.  Bowel sounds present. Extremities:  No lower extremity edema.  Chest:  Sternum is solid. Wound is clean, dry, and well healed.  There are 2 eschars at the previous chest tube sites.  These were removed without difficulty.  The former chest tube site on the patient's left did have a small area of skin edge separation.  No erythema or discharge.  DIAGNOSTIC TESTS:  Chest x-ray done today and the actual  report I do not have available as our computer systems are down.  However, there is no pneumothorax or pleural effusions.  Lungs are clear.  IMPRESSION AND PLAN: 1. Overall, the patient is surgically stable status post aortic valve     replacement.  He has already seen Dr. [name] in routine followup.     He is going to have a 2-D echocardiogram obtained on December 05, 2010, and we will continue to be followed long-term by him.  It     should be noted that the only changes to the patient's discharge     medications are related to the adjustments made in Coumadin based     on the patient's PT and INR.  His next PT and INR is to be drawn on     Wednesday, again Dr. Erin Hearing office is following this. 2. The patient was instructed he may begin driving short distances,     i.e., less than 30 minutes during the day for the next week     provided he is not taking any pain medicines and is able to do so     without pain.  He may gradually increase his frequency and duration     as tolerates. 3. The patient was encouraged to participate in cardiac rehab.  The     patient is going to decide whether or  not he will participate as he     has already been walking a fair amount without any difficulty on     his own. 4. The patient was encouraged to continue with sternal precautions,     i.e., no lifting more than 10 pounds for the next 3-4 weeks. 5. The patient was instructed to just use soap and water on that chest     tube site and use a Band-Aid and change this daily.  He is to     contact the office if any erythema, drainage, fever, or chills     should develop.  The patient is going to release from our care at     this time.  He is to contact our office if any questions, problems,     or concerns arise.  Doree Fudge, PA  DZ/MEDQ  D:  10/31/2010  T:  10/31/2010  Job:  [id number]  cc:   Thurmon Fair, MD

## 2010-11-07 ENCOUNTER — Other Ambulatory Visit: Payer: Self-pay

## 2010-11-07 ENCOUNTER — Encounter (HOSPITAL_COMMUNITY): Payer: Medicare Other | Attending: Cardiovascular Disease

## 2010-11-07 DIAGNOSIS — I10 Essential (primary) hypertension: Secondary | ICD-10-CM | POA: Insufficient documentation

## 2010-11-07 DIAGNOSIS — Z7982 Long term (current) use of aspirin: Secondary | ICD-10-CM | POA: Insufficient documentation

## 2010-11-07 DIAGNOSIS — E785 Hyperlipidemia, unspecified: Secondary | ICD-10-CM | POA: Insufficient documentation

## 2010-11-07 DIAGNOSIS — Z5189 Encounter for other specified aftercare: Secondary | ICD-10-CM | POA: Insufficient documentation

## 2010-11-07 DIAGNOSIS — Z954 Presence of other heart-valve replacement: Secondary | ICD-10-CM | POA: Insufficient documentation

## 2010-11-07 DIAGNOSIS — E119 Type 2 diabetes mellitus without complications: Secondary | ICD-10-CM | POA: Insufficient documentation

## 2010-11-07 DIAGNOSIS — Z950 Presence of cardiac pacemaker: Secondary | ICD-10-CM | POA: Insufficient documentation

## 2010-11-07 DIAGNOSIS — I359 Nonrheumatic aortic valve disorder, unspecified: Secondary | ICD-10-CM | POA: Insufficient documentation

## 2010-11-07 DIAGNOSIS — F17200 Nicotine dependence, unspecified, uncomplicated: Secondary | ICD-10-CM | POA: Insufficient documentation

## 2010-11-08 LAB — GLUCOSE, CAPILLARY: Glucose-Capillary: 109 mg/dL — ABNORMAL HIGH (ref 70–99)

## 2010-11-09 ENCOUNTER — Other Ambulatory Visit: Payer: Self-pay

## 2010-11-09 ENCOUNTER — Encounter (HOSPITAL_COMMUNITY): Payer: Medicare Other

## 2010-11-11 ENCOUNTER — Encounter (HOSPITAL_COMMUNITY): Payer: Medicare Other | Attending: Cardiovascular Disease

## 2010-11-11 ENCOUNTER — Other Ambulatory Visit: Payer: Self-pay

## 2010-11-11 DIAGNOSIS — Z954 Presence of other heart-valve replacement: Secondary | ICD-10-CM | POA: Insufficient documentation

## 2010-11-11 DIAGNOSIS — E785 Hyperlipidemia, unspecified: Secondary | ICD-10-CM | POA: Insufficient documentation

## 2010-11-11 DIAGNOSIS — Z5189 Encounter for other specified aftercare: Secondary | ICD-10-CM | POA: Insufficient documentation

## 2010-11-11 DIAGNOSIS — Z950 Presence of cardiac pacemaker: Secondary | ICD-10-CM | POA: Insufficient documentation

## 2010-11-11 DIAGNOSIS — E119 Type 2 diabetes mellitus without complications: Secondary | ICD-10-CM | POA: Insufficient documentation

## 2010-11-11 DIAGNOSIS — I359 Nonrheumatic aortic valve disorder, unspecified: Secondary | ICD-10-CM | POA: Insufficient documentation

## 2010-11-11 DIAGNOSIS — I10 Essential (primary) hypertension: Secondary | ICD-10-CM | POA: Insufficient documentation

## 2010-11-11 DIAGNOSIS — F17200 Nicotine dependence, unspecified, uncomplicated: Secondary | ICD-10-CM | POA: Insufficient documentation

## 2010-11-11 DIAGNOSIS — Z7982 Long term (current) use of aspirin: Secondary | ICD-10-CM | POA: Insufficient documentation

## 2010-11-11 LAB — GLUCOSE, CAPILLARY: Glucose-Capillary: 84 mg/dL (ref 70–99)

## 2010-11-14 ENCOUNTER — Encounter (HOSPITAL_COMMUNITY): Payer: Medicare Other

## 2010-11-16 ENCOUNTER — Encounter (HOSPITAL_COMMUNITY): Payer: Medicare Other

## 2010-11-16 ENCOUNTER — Other Ambulatory Visit: Payer: Self-pay

## 2010-11-16 LAB — GLUCOSE, CAPILLARY: Glucose-Capillary: 137 mg/dL — ABNORMAL HIGH (ref 70–99)

## 2010-11-18 ENCOUNTER — Encounter (HOSPITAL_COMMUNITY): Payer: Medicare Other

## 2010-11-21 ENCOUNTER — Encounter (HOSPITAL_COMMUNITY): Payer: Medicare Other

## 2010-11-22 LAB — GLUCOSE, CAPILLARY
Glucose-Capillary: 100 mg/dL — ABNORMAL HIGH (ref 70–99)
Glucose-Capillary: 119 mg/dL — ABNORMAL HIGH (ref 70–99)
Glucose-Capillary: 61 mg/dL — ABNORMAL LOW (ref 70–99)
Glucose-Capillary: 99 mg/dL (ref 70–99)

## 2010-11-22 LAB — SURGICAL PCR SCREEN
MRSA, PCR: NEGATIVE
Staphylococcus aureus: NEGATIVE

## 2010-11-23 ENCOUNTER — Encounter (HOSPITAL_COMMUNITY): Payer: Medicare Other

## 2010-11-23 LAB — GLUCOSE, CAPILLARY
Glucose-Capillary: 105 mg/dL — ABNORMAL HIGH (ref 70–99)
Glucose-Capillary: 105 mg/dL — ABNORMAL HIGH (ref 70–99)
Glucose-Capillary: 108 mg/dL — ABNORMAL HIGH (ref 70–99)
Glucose-Capillary: 110 mg/dL — ABNORMAL HIGH (ref 70–99)
Glucose-Capillary: 116 mg/dL — ABNORMAL HIGH (ref 70–99)
Glucose-Capillary: 128 mg/dL — ABNORMAL HIGH (ref 70–99)
Glucose-Capillary: 129 mg/dL — ABNORMAL HIGH (ref 70–99)
Glucose-Capillary: 194 mg/dL — ABNORMAL HIGH (ref 70–99)
Glucose-Capillary: 94 mg/dL (ref 70–99)
Glucose-Capillary: 95 mg/dL (ref 70–99)
Glucose-Capillary: 97 mg/dL (ref 70–99)
Glucose-Capillary: 98 mg/dL (ref 70–99)

## 2010-11-23 LAB — DIFFERENTIAL
Eosinophils Absolute: 0.2 10*3/uL (ref 0.0–0.7)
Eosinophils Relative: 2 % (ref 0–5)
Lymphocytes Relative: 17 % (ref 12–46)
Lymphs Abs: 1.6 10*3/uL (ref 0.7–4.0)
Monocytes Absolute: 1 10*3/uL (ref 0.1–1.0)
Monocytes Relative: 11 % (ref 3–12)

## 2010-11-23 LAB — CARDIAC PANEL(CRET KIN+CKTOT+MB+TROPI)
CK, MB: 8.2 ng/mL (ref 0.3–4.0)
Total CK: 441 U/L — ABNORMAL HIGH (ref 7–232)
Troponin I: 0.39 ng/mL — ABNORMAL HIGH (ref 0.00–0.06)

## 2010-11-23 LAB — POCT I-STAT 3, VENOUS BLOOD GAS (G3P V)
Acid-base deficit: 2 mmol/L (ref 0.0–2.0)
O2 Saturation: 64 %
TCO2: 24 mmol/L (ref 0–100)
pO2, Ven: 33 mmHg (ref 30.0–45.0)

## 2010-11-23 LAB — CK TOTAL AND CKMB (NOT AT ARMC)
CK, MB: 9.7 ng/mL (ref 0.3–4.0)
Total CK: 425 U/L — ABNORMAL HIGH (ref 7–232)

## 2010-11-23 LAB — CULTURE, BLOOD (ROUTINE X 2)
Culture  Setup Time: 201110180509
Culture: NO GROWTH

## 2010-11-23 LAB — CBC
HCT: 44.9 % (ref 39.0–52.0)
Hemoglobin: 14.3 g/dL (ref 13.0–17.0)
MCH: 31 pg (ref 26.0–34.0)
MCH: 31.6 pg (ref 26.0–34.0)
MCHC: 34.5 g/dL (ref 30.0–36.0)
MCV: 92.8 fL (ref 78.0–100.0)
Platelets: 180 10*3/uL (ref 150–400)
Platelets: 188 10*3/uL (ref 150–400)
Platelets: 226 10*3/uL (ref 150–400)
RBC: 4.84 MIL/uL (ref 4.22–5.81)
RBC: 5.33 MIL/uL (ref 4.22–5.81)
RDW: 13.1 % (ref 11.5–15.5)
RDW: 13.8 % (ref 11.5–15.5)
WBC: 10.7 10*3/uL — ABNORMAL HIGH (ref 4.0–10.5)
WBC: 17.8 10*3/uL — ABNORMAL HIGH (ref 4.0–10.5)
WBC: 9 10*3/uL (ref 4.0–10.5)

## 2010-11-23 LAB — POCT CARDIAC MARKERS
Myoglobin, poc: 434 ng/mL (ref 12–200)
Troponin i, poc: <0.05 ng/mL (ref 0.00–0.09)

## 2010-11-23 LAB — BASIC METABOLIC PANEL
BUN: 15 mg/dL (ref 6–23)
CO2: 23 mEq/L (ref 19–32)
Calcium: 8 mg/dL — ABNORMAL LOW (ref 8.4–10.5)
Chloride: 108 mEq/L (ref 96–112)
Creatinine, Ser: 0.89 mg/dL (ref 0.4–1.5)
Creatinine, Ser: 0.95 mg/dL (ref 0.4–1.5)
GFR calc Af Amer: >60 mL/min (ref >60)
GFR calc non Af Amer: >60 mL/min (ref >60)
Potassium: 3.9 mEq/L (ref 3.5–5.1)
Potassium: 4.1 mEq/L (ref 3.5–5.1)

## 2010-11-23 LAB — PROTIME-INR
INR: 1.2 (ref 0.00–1.49)
Prothrombin Time: 15.4 seconds — ABNORMAL HIGH (ref 11.6–15.2)

## 2010-11-23 LAB — POCT I-STAT 3, ART BLOOD GAS (G3+)
Acid-base deficit: 3 mmol/L — ABNORMAL HIGH (ref 0.0–2.0)
Bicarbonate: 21.2 mEq/L (ref 20.0–24.0)
O2 Saturation: 93 %
pCO2 arterial: 33.1 mmHg — ABNORMAL LOW (ref 35.0–45.0)
pO2, Arterial: 64 mmHg — ABNORMAL LOW (ref 80.0–100.0)

## 2010-11-23 LAB — POCT I-STAT, CHEM 8
BUN: 27 mg/dL — ABNORMAL HIGH (ref 6–23)
Chloride: 109 mEq/L (ref 96–112)
Sodium: 137 mEq/L (ref 135–145)

## 2010-11-25 ENCOUNTER — Encounter (HOSPITAL_COMMUNITY): Payer: Medicare Other

## 2010-11-28 ENCOUNTER — Encounter (HOSPITAL_COMMUNITY): Payer: Medicare Other

## 2010-11-30 ENCOUNTER — Encounter (HOSPITAL_COMMUNITY): Payer: Medicare Other

## 2010-12-02 ENCOUNTER — Encounter (HOSPITAL_COMMUNITY): Payer: Medicare Other

## 2010-12-05 ENCOUNTER — Encounter (HOSPITAL_COMMUNITY): Payer: Medicare Other

## 2010-12-07 ENCOUNTER — Encounter (HOSPITAL_COMMUNITY): Payer: Medicare Other

## 2010-12-09 ENCOUNTER — Encounter (HOSPITAL_COMMUNITY): Payer: Medicare Other

## 2010-12-12 ENCOUNTER — Encounter (HOSPITAL_COMMUNITY): Payer: Medicare Other | Attending: Cardiovascular Disease

## 2010-12-12 DIAGNOSIS — Z954 Presence of other heart-valve replacement: Secondary | ICD-10-CM | POA: Insufficient documentation

## 2010-12-12 DIAGNOSIS — Z950 Presence of cardiac pacemaker: Secondary | ICD-10-CM | POA: Insufficient documentation

## 2010-12-12 DIAGNOSIS — I10 Essential (primary) hypertension: Secondary | ICD-10-CM | POA: Insufficient documentation

## 2010-12-12 DIAGNOSIS — I359 Nonrheumatic aortic valve disorder, unspecified: Secondary | ICD-10-CM | POA: Insufficient documentation

## 2010-12-12 DIAGNOSIS — E119 Type 2 diabetes mellitus without complications: Secondary | ICD-10-CM | POA: Insufficient documentation

## 2010-12-12 DIAGNOSIS — Z5189 Encounter for other specified aftercare: Secondary | ICD-10-CM | POA: Insufficient documentation

## 2010-12-12 DIAGNOSIS — F17200 Nicotine dependence, unspecified, uncomplicated: Secondary | ICD-10-CM | POA: Insufficient documentation

## 2010-12-12 DIAGNOSIS — E785 Hyperlipidemia, unspecified: Secondary | ICD-10-CM | POA: Insufficient documentation

## 2010-12-12 DIAGNOSIS — Z7982 Long term (current) use of aspirin: Secondary | ICD-10-CM | POA: Insufficient documentation

## 2010-12-14 ENCOUNTER — Encounter (HOSPITAL_COMMUNITY): Payer: Medicare Other

## 2010-12-16 ENCOUNTER — Encounter (HOSPITAL_COMMUNITY): Payer: Medicare Other

## 2010-12-19 ENCOUNTER — Encounter (HOSPITAL_COMMUNITY): Payer: Medicare Other

## 2010-12-21 ENCOUNTER — Encounter (HOSPITAL_COMMUNITY): Payer: Medicare Other

## 2010-12-23 ENCOUNTER — Encounter (HOSPITAL_COMMUNITY): Payer: Medicare Other

## 2010-12-26 ENCOUNTER — Encounter (HOSPITAL_COMMUNITY): Payer: Medicare Other

## 2010-12-28 ENCOUNTER — Encounter (HOSPITAL_COMMUNITY): Payer: Medicare Other

## 2010-12-30 ENCOUNTER — Other Ambulatory Visit: Payer: Self-pay | Admitting: Cardiovascular Disease

## 2010-12-30 ENCOUNTER — Encounter (HOSPITAL_COMMUNITY): Payer: Medicare Other

## 2011-01-02 ENCOUNTER — Encounter (HOSPITAL_COMMUNITY): Payer: Medicare Other

## 2011-01-04 ENCOUNTER — Encounter (HOSPITAL_COMMUNITY): Payer: Medicare Other

## 2011-01-06 ENCOUNTER — Encounter (HOSPITAL_COMMUNITY): Payer: Medicare Other

## 2011-01-09 ENCOUNTER — Encounter (HOSPITAL_COMMUNITY): Payer: Medicare Other

## 2011-01-11 ENCOUNTER — Encounter (HOSPITAL_COMMUNITY): Payer: Medicare Other | Attending: Cardiovascular Disease

## 2011-01-11 DIAGNOSIS — Z950 Presence of cardiac pacemaker: Secondary | ICD-10-CM | POA: Insufficient documentation

## 2011-01-11 DIAGNOSIS — E119 Type 2 diabetes mellitus without complications: Secondary | ICD-10-CM | POA: Insufficient documentation

## 2011-01-11 DIAGNOSIS — I359 Nonrheumatic aortic valve disorder, unspecified: Secondary | ICD-10-CM | POA: Insufficient documentation

## 2011-01-11 DIAGNOSIS — Z7982 Long term (current) use of aspirin: Secondary | ICD-10-CM | POA: Insufficient documentation

## 2011-01-11 DIAGNOSIS — F17200 Nicotine dependence, unspecified, uncomplicated: Secondary | ICD-10-CM | POA: Insufficient documentation

## 2011-01-11 DIAGNOSIS — Z954 Presence of other heart-valve replacement: Secondary | ICD-10-CM | POA: Insufficient documentation

## 2011-01-11 DIAGNOSIS — Z5189 Encounter for other specified aftercare: Secondary | ICD-10-CM | POA: Insufficient documentation

## 2011-01-11 DIAGNOSIS — E785 Hyperlipidemia, unspecified: Secondary | ICD-10-CM | POA: Insufficient documentation

## 2011-01-11 DIAGNOSIS — I10 Essential (primary) hypertension: Secondary | ICD-10-CM | POA: Insufficient documentation

## 2011-01-13 ENCOUNTER — Encounter (HOSPITAL_COMMUNITY): Payer: Medicare Other

## 2011-01-16 ENCOUNTER — Encounter (HOSPITAL_COMMUNITY): Payer: Medicare Other

## 2011-01-18 ENCOUNTER — Encounter (HOSPITAL_COMMUNITY): Payer: Medicare Other

## 2011-01-20 ENCOUNTER — Encounter (HOSPITAL_COMMUNITY): Payer: Medicare Other

## 2011-01-20 ENCOUNTER — Other Ambulatory Visit: Payer: Self-pay | Admitting: Cardiovascular Disease

## 2011-01-23 ENCOUNTER — Encounter (HOSPITAL_COMMUNITY): Payer: Medicare Other

## 2011-01-24 NOTE — Consult Note (Signed)
NEW PATIENT CONSULTATION   [name] [name]  DOB:  [birth year]                                        September 14, 2010  CHART #:  [id number]   REASON FOR CONSULTATION:  Moderate-to-severe aortic stenosis, probable  bicuspid aortic valve with normal coronaries.   HISTORY OF PRESENT ILLNESS:  I was asked to evaluate this 65 year old  Caucasian male diabetic nonsmoker for possible aortic valve replacement  for recently diagnosed severe aortic stenosis.  The patient has had a  murmur since adolescence probably to a bicuspid valve.  However, he has  been asymptomatic until recently.  In October 2011, he had a syncopal  episode felt to be related to a second-degree heart block and had a  transvenous dual-chamber pacemaker placed.  He has had serial 2-D echoes  over the years and his most recent echo showed an increase in  transvalvular gradient and a decrease in the aortic valve area to 0.9  (peak gradient of 70 mmHg mean gradient 39 mmHg).  There is mild AI and  mild MR, normal LVEF.  There is no evidence of root enlargement.  He  underwent diagnostic cath in October as well by Dr. [name], which  showed no significant coronary artery disease.  His PA pressures were  40/20 with a cardiac output of 4.9, mixed venous saturation of 64% and  pulmonary care for wedge pressure 21 mmHg.  The patient has recovered  from his pacemaker and his syncopal episode now presents for  consideration of aortic valve replacement.  No one in his His family has  had an aortic valve replacement.  He last saw his dentist a year ago.   PAST MEDICAL HISTORY:  1. Hypertension.  2. Dyslipidemia.  3. 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  4. Type 2 diabetes mellitus.   ALLERGIES:  No known drug allergies.   HOME MEDICATIONS:  Actos 30 mg p.o. q.a.m., Diovan 80 mg one p.o. daily,  glipizide/metformin 5/500 one tablet b.i.d., metoprolol tartrate 25 mg  one-half tablet b.i.d., simvastatin 80 mg daily, aspirin 81 mg daily.   SOCIAL HISTORY:  The patient quit smoking over 10 years ago.  He was in  the [employer], and is now retired.  He denies alcohol intake.   REVIEW OF SYSTEMS:  CONSTITUTIONAL:  Negative fever or weight loss or  night sweats.  ENT:  Review is negative for active dental complaints, although he has  not seen his dentist in a year.  He denies difficulty swallowing.  THORACIC:  Negative for history of chest trauma, pneumothorax or rib  fracture.  CARDIAC:  Positive as long history of cardiac murmur, fairly  asymptomatic until recently.  He has no history of her rheumatic fever  as a child.  His carotid duplex scan last year showed no significant  carotid artery disease.  GI:  Negative for hepatitis, jaundice, and  blood per rectum.  NEUROLOGIC:  Negative for kidney stones or BPH.  VASCULAR:  Negative DVT, claudication or TIA.  NEUROLOGIC:  Positive for his syncopal episode and that resulted in a  pacemaker placement, last fall.  HEMATOLOGIC:  Negative for bleeding  disorder.  Blood transfusion.   PHYSICAL EXAMINATION:  Vital Signs:  The patient is 5 feet 11, weighs  209 pounds, blood pressure 112/110,  pulse 88 and regular, respirations  18, saturation 97% on room air.  He is afebrile.  General Appearance:  A  pleasant middle-aged Caucasian male in no acute distress accompanied by  his brother.  HEENT:  Normocephalic.  Dentition appears to be adequate.  Neck:  Without JVD, mass or bruit.  Lymphatics: No palpable  supraclavicular or cervical adenopathy.  Breath sounds are clear and  there is no thoracic deformity.  Cardiac:  A loud systolic murmur in the  right upper sternal border.  There is no diastolic murmur.  Abdomen:  Soft, nontender without pulsatile mass.  Extremities:  No clubbing,  cyanosis, edema.  Peripheral pulses are 2+ in all extremities.  He has  no diabetic ulceration of his lower extremities or  evidence of varicose  veins.  Neurologic:  Alert and oriented without focal motor deficit.   LABORATORY DATA:  Reviewed his previous cath and echo report.  He has  severe aortic stenosis with a valve area of 0.9 with a peak gradient of  70 mmHg by echo and mild AI.   IMPRESSION AND PLAN:  The patient would benefit from aortic valve  replacement.  At age 18, I would recommend a bioprosthetic valve.  However, before scheduling the surgery, we will need to be evaluated and  received dental cleaning by his dentist.  We will see him back in the  next 7-10 days after his dental evaluation to schedule his date of  surgery.   Thank you very much for the consultation.   Kerin Perna, M.D.  Electronically Signed   PV/MEDQ  D:  09/14/2010  T:  09/15/2010  Job:  [id number]   cc:   Thurmon Fair, MD  [facility].

## 2011-01-24 NOTE — H&P (Signed)
HISTORY AND PHYSICAL EXAMINATION   September 28, 2010   Re:  [name] [name], [name] [name]            DOB:  [birth year]   ADMISSION DIAGNOSES:  1. Severe aortic stenosis, bicuspid aortic valve.  2. History of second-degree block, treated with dual-chamber      pacemaker, October 2011.  3. Hypertension.  4. Diabetes mellitus.   HISTORY OF PRESENT ILLNESS:  The patient is a 65 year old Caucasian  male, diabetic, nonsmoker who was recently diagnosed with severe aortic  stenosis.  The patient has had a murmur since adolescence probably  related to a bicuspid valve, but he has been asymptomatic until  recently.  This past fall, he had a syncopal episode and was found by  monitored to have second-degree heart block and a transvenous dual-  chamber pacemaker was placed.  Serial 2-D echoes over the past few years  have shown an increase in aortic stenosis with a recent aortic valve  area measured at 0.9 with a peak gradient of 70 mmHg and mean gradient  of 39 mmHg.  The patient also had associated mild AI.  His LVEF by echo  was normal.  The patient subsequently underwent left and right heart  cath by Dr. [name] which showed no significant coronary disease with  PA pressures of 40/20 and a cardiac output of 4.9 L per minute.  Wedge  pressure was 21 and his EF was normal.  He was recommended for aortic  valve replacement.  The patient recently saw his dentist and was cleared  for elective cardiac valve replacement (Dr. Rollen Sox).   PAST MEDICAL HISTORY:  1. No known drug allergies.  2. Diabetes mellitus type 2.  3. Hypertension.  4. Reformed smoker.  5. Dyslipidemia.   HOME MEDICATIONS:  1. Actos 30 mg q.a.m.  2. Diovan 80 mg daily.  3. Glipizide/metformin 5/500 mg 1 b.i.d.  4. Metoprolol tartrate 12.5 mg b.i.d.  5. Simvastatin 80 mg daily.  6. Aspirin 81 mg daily.   SOCIAL HISTORY:  The patient quit smoking over 10 years ago.  He is  retired from the [employer] and  he denies alcohol intake.   REVIEW OF SYSTEMS:  CONSTITUTIONAL:  Negative for fever or weight loss.  ENT:  Negative for active dental complaints or difficulty swallowing.  He is recently cleared by his dentist for elective valve replacement  surgery with dental cleaning.  THORACIC:  Negative for abnormal chest x-ray or history of chest trauma.  CARDIAC:  Positive for a long history of cardiac murmur, asymptomatic  until recently when he has developed some dyspnea on exertion and  syncope.  His cardiac duplex scan recently showed no significant carotid  disease.  GI:  Negative for hepatitis, jaundice, or blood per rectum.  UROLOGIC:  Negative for kidney stones or BPH.  VASCULAR:  Negative for DVT, claudication, or TIA.  NEUROLOGIC:  Positive for syncopal episode that resulted in a pacemaker  placement.  HEMATOLOGIC:  Negative for bleeding or blood transfusion.   PHYSICAL EXAMINATION:  Vital Signs:  The patient is 5 feet 10 inches,  weighs 209 pounds.  Blood pressure 160/100, pulse 80, respirations 18,  and saturation 98%.  General:  He is a middle-aged male, age 45, in no  acute distress.  HEENT:  Normocephalic.  Dentition is good.  Neck:  Without JVD, mass, or bruit.  Lymphatics:  No palpable supraclavicular  or cervical adenopathy.  Lungs:  Breath sounds are clear and there is no  thoracic deformity.  Cardiac:  A loud systolic murmur is heard in the  right upper sternal border.  There is no diastolic murmur.  Abdomen:  Soft and tender without pulsatile mass.  Extremities:  No clubbing,  cyanosis, or edema.  Vascular:  Peripheral pulses are 2+ and strong in  all extremities.  No diabetic ulceration of the lower extremities or  evidence of varicose veins.  Neurologic:  Alert and oriented without  focal motor deficit.  Normal gait.   LABORATORY DATA:  Reviewed his echo and cardiac cath and he has severe  aortic stenosis with valve area of 0.9 and peak gradient of 70 mmHg.    IMPRESSION AND PLAN:  The patient would benefit from aortic valve  replacement and I would recommend a bioprosthetic valve due to his age  57.  We discussed the details of surgery,  risks, benefits, and alternatives and we will schedule his elective  aortic valve replacement on October 06, 2010.  He will stop his  metformin approximately 36 hours preoperatively.   Kerin Perna, M.D.  Electronically Signed   PV/MEDQ  D:  09/28/2010  T:  09/29/2010  Job:  [id number]   cc:   Thurmon Fair, MD  Michel Bickers, NP

## 2011-01-25 ENCOUNTER — Encounter (HOSPITAL_COMMUNITY): Payer: Medicare Other

## 2011-01-27 ENCOUNTER — Encounter (HOSPITAL_COMMUNITY): Payer: Medicare Other

## 2011-01-30 ENCOUNTER — Encounter (HOSPITAL_COMMUNITY): Payer: Medicare Other

## 2011-02-01 ENCOUNTER — Encounter (HOSPITAL_COMMUNITY): Payer: Medicare Other

## 2011-02-03 ENCOUNTER — Encounter (HOSPITAL_COMMUNITY): Payer: Medicare Other

## 2011-02-06 ENCOUNTER — Encounter (HOSPITAL_COMMUNITY): Payer: Medicare Other

## 2011-02-08 ENCOUNTER — Encounter (HOSPITAL_COMMUNITY): Payer: Medicare Other

## 2011-02-10 ENCOUNTER — Encounter (HOSPITAL_COMMUNITY): Payer: Medicare Other

## 2012-09-12 IMAGING — CR DG CHEST 2V
2 series · 2 of 2 positions shown · non-contrast
Comparison: 07/22/2010.

CLINICAL DATA: Aortic valve stenosis.  Preoperative evaluation.

CHEST - 2 VIEW

[view not recorded (1 of 2)]
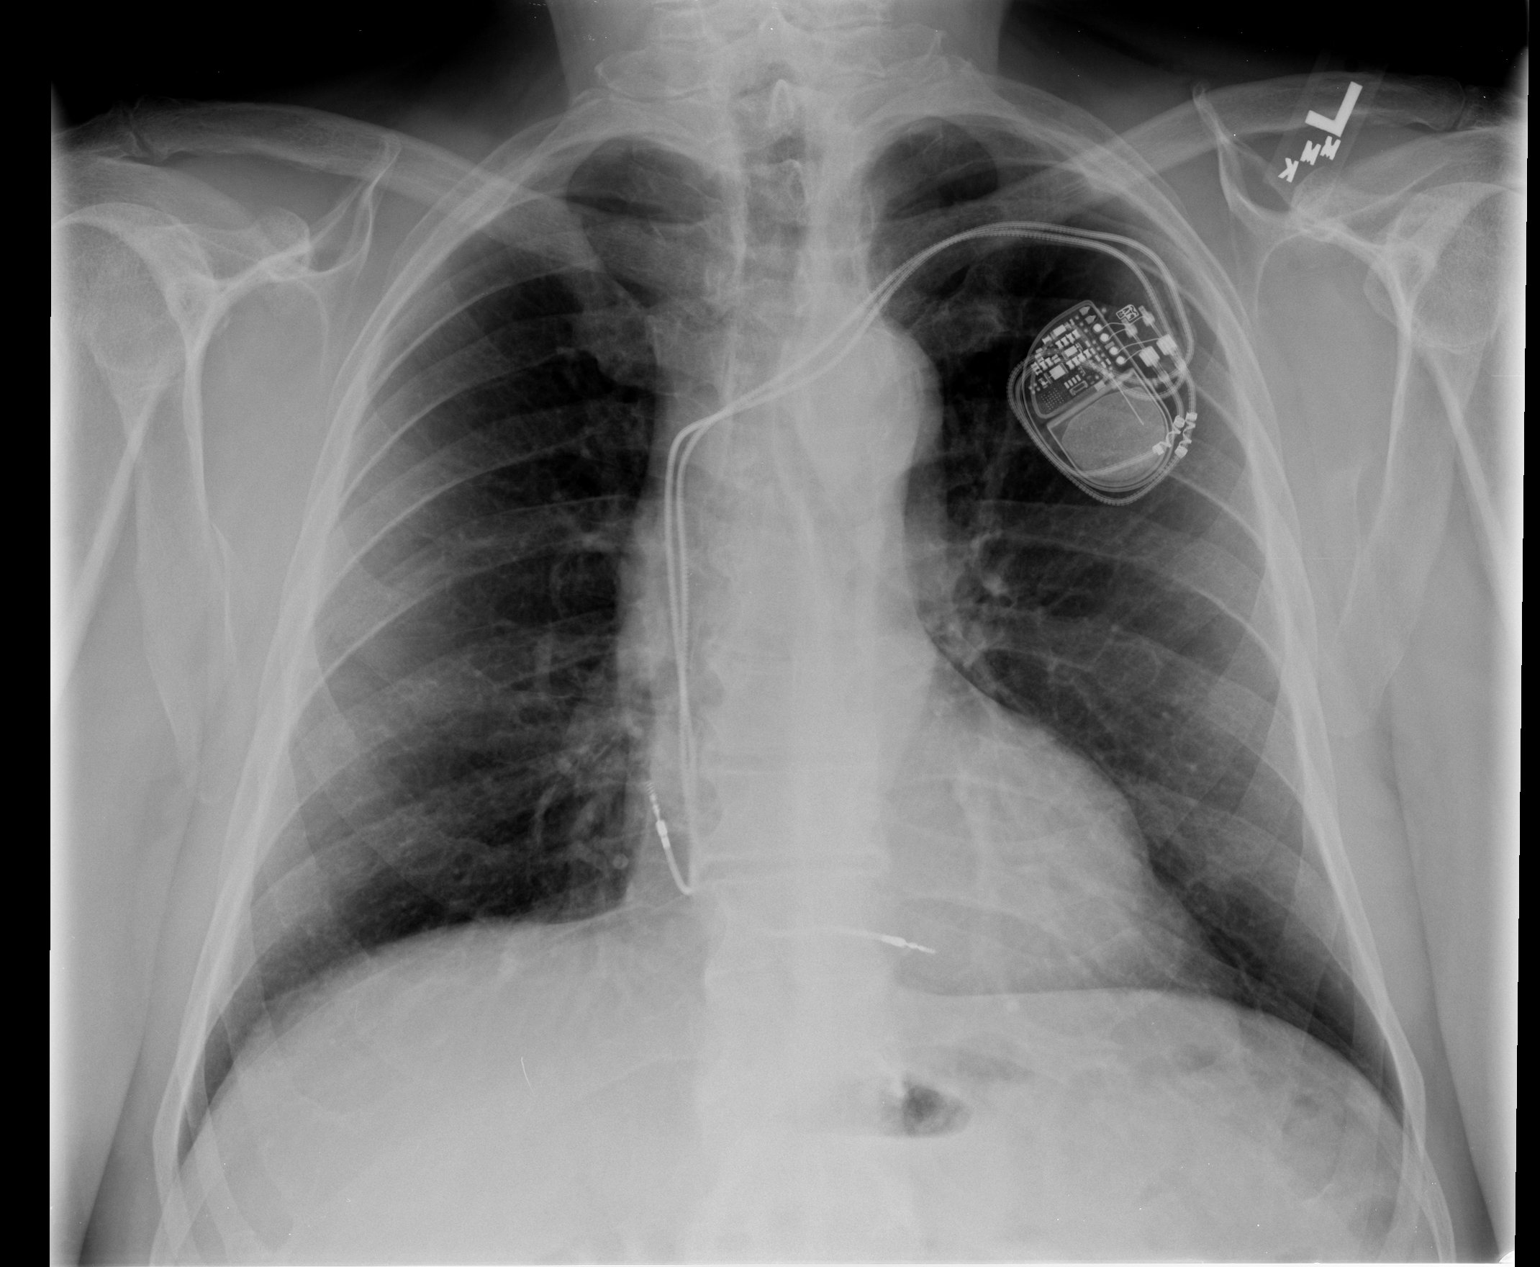

[view not recorded (2 of 2)]
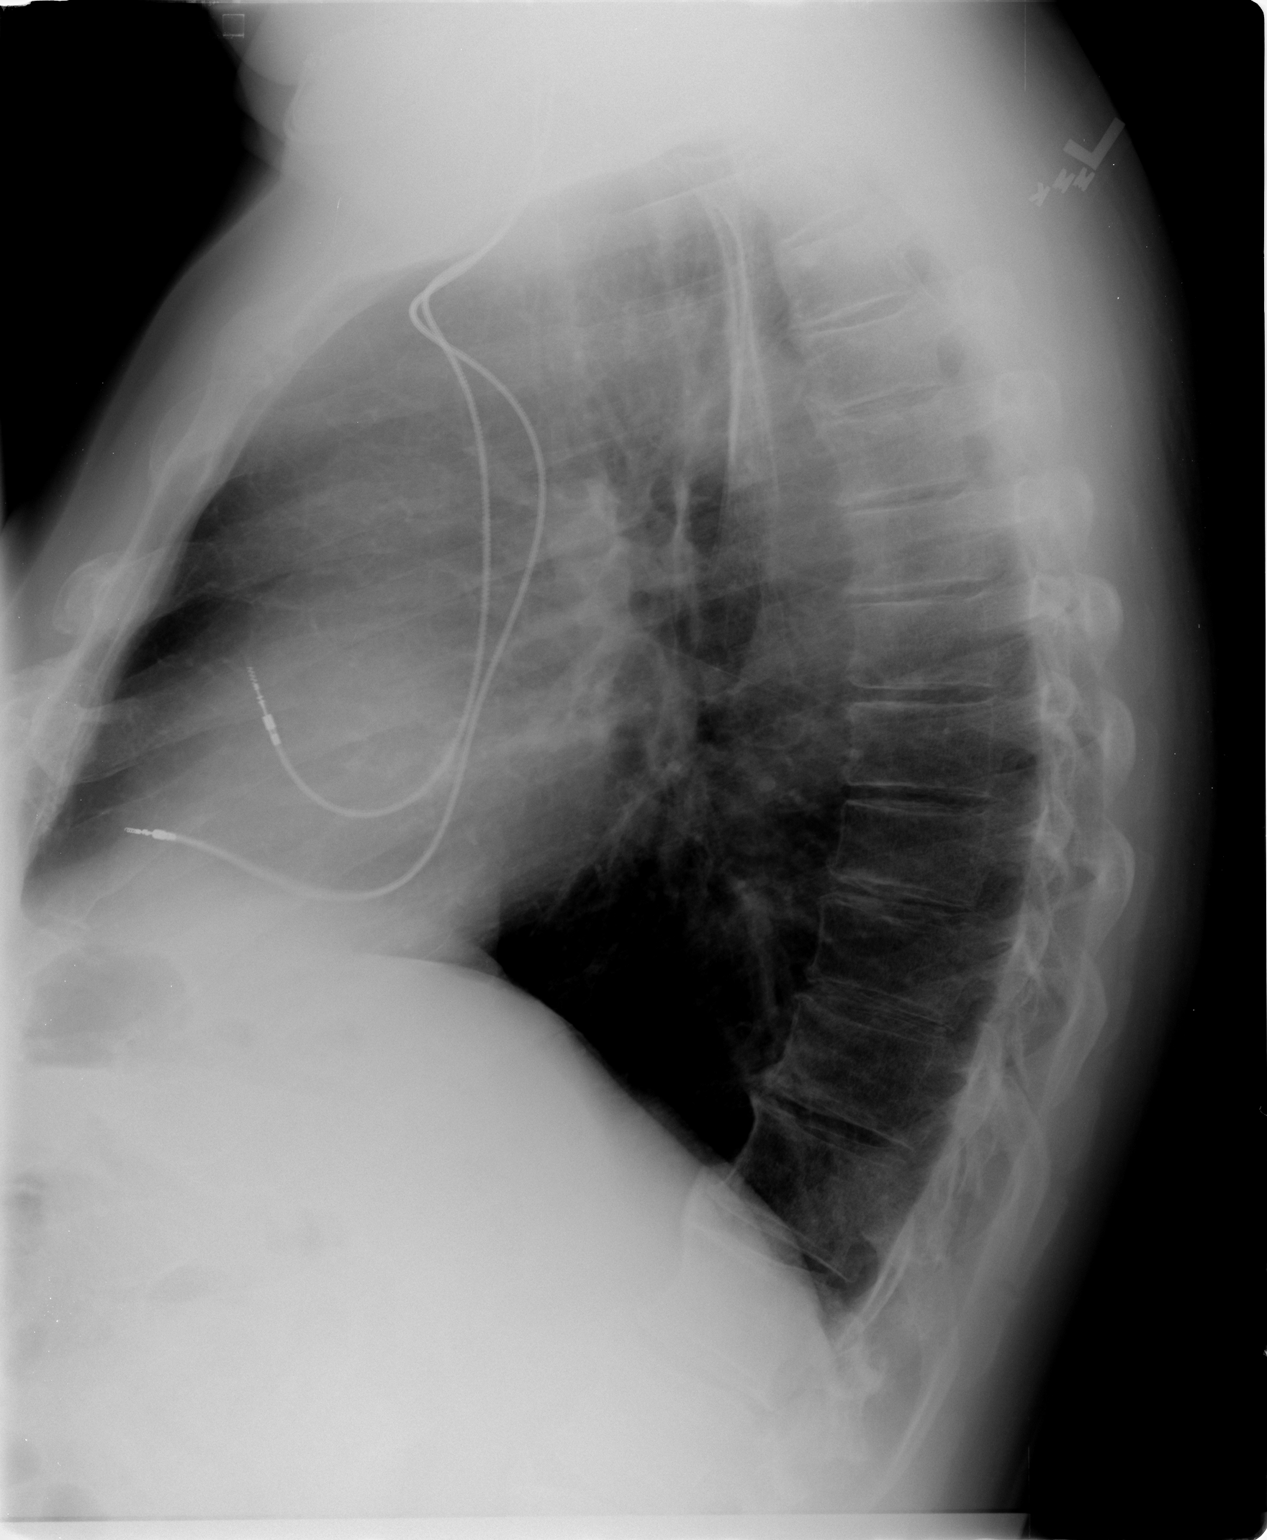

[2 of 2 positions shown; findings below may reference images not displayed]

FINDINGS: Stable normal sized heart and left subclavian pacemaker
leads.  Clear lungs.  Mild thoracic spine degenerative changes.
IMPRESSION: No acute abnormality.

## 2012-09-17 IMAGING — CR DG CHEST 2V
2 series · 2 of 2 positions shown · non-contrast
Comparison: 10/08/2010

CLINICAL DATA: Aortic stenosis.

CHEST - 2 VIEW

[w chest pa]
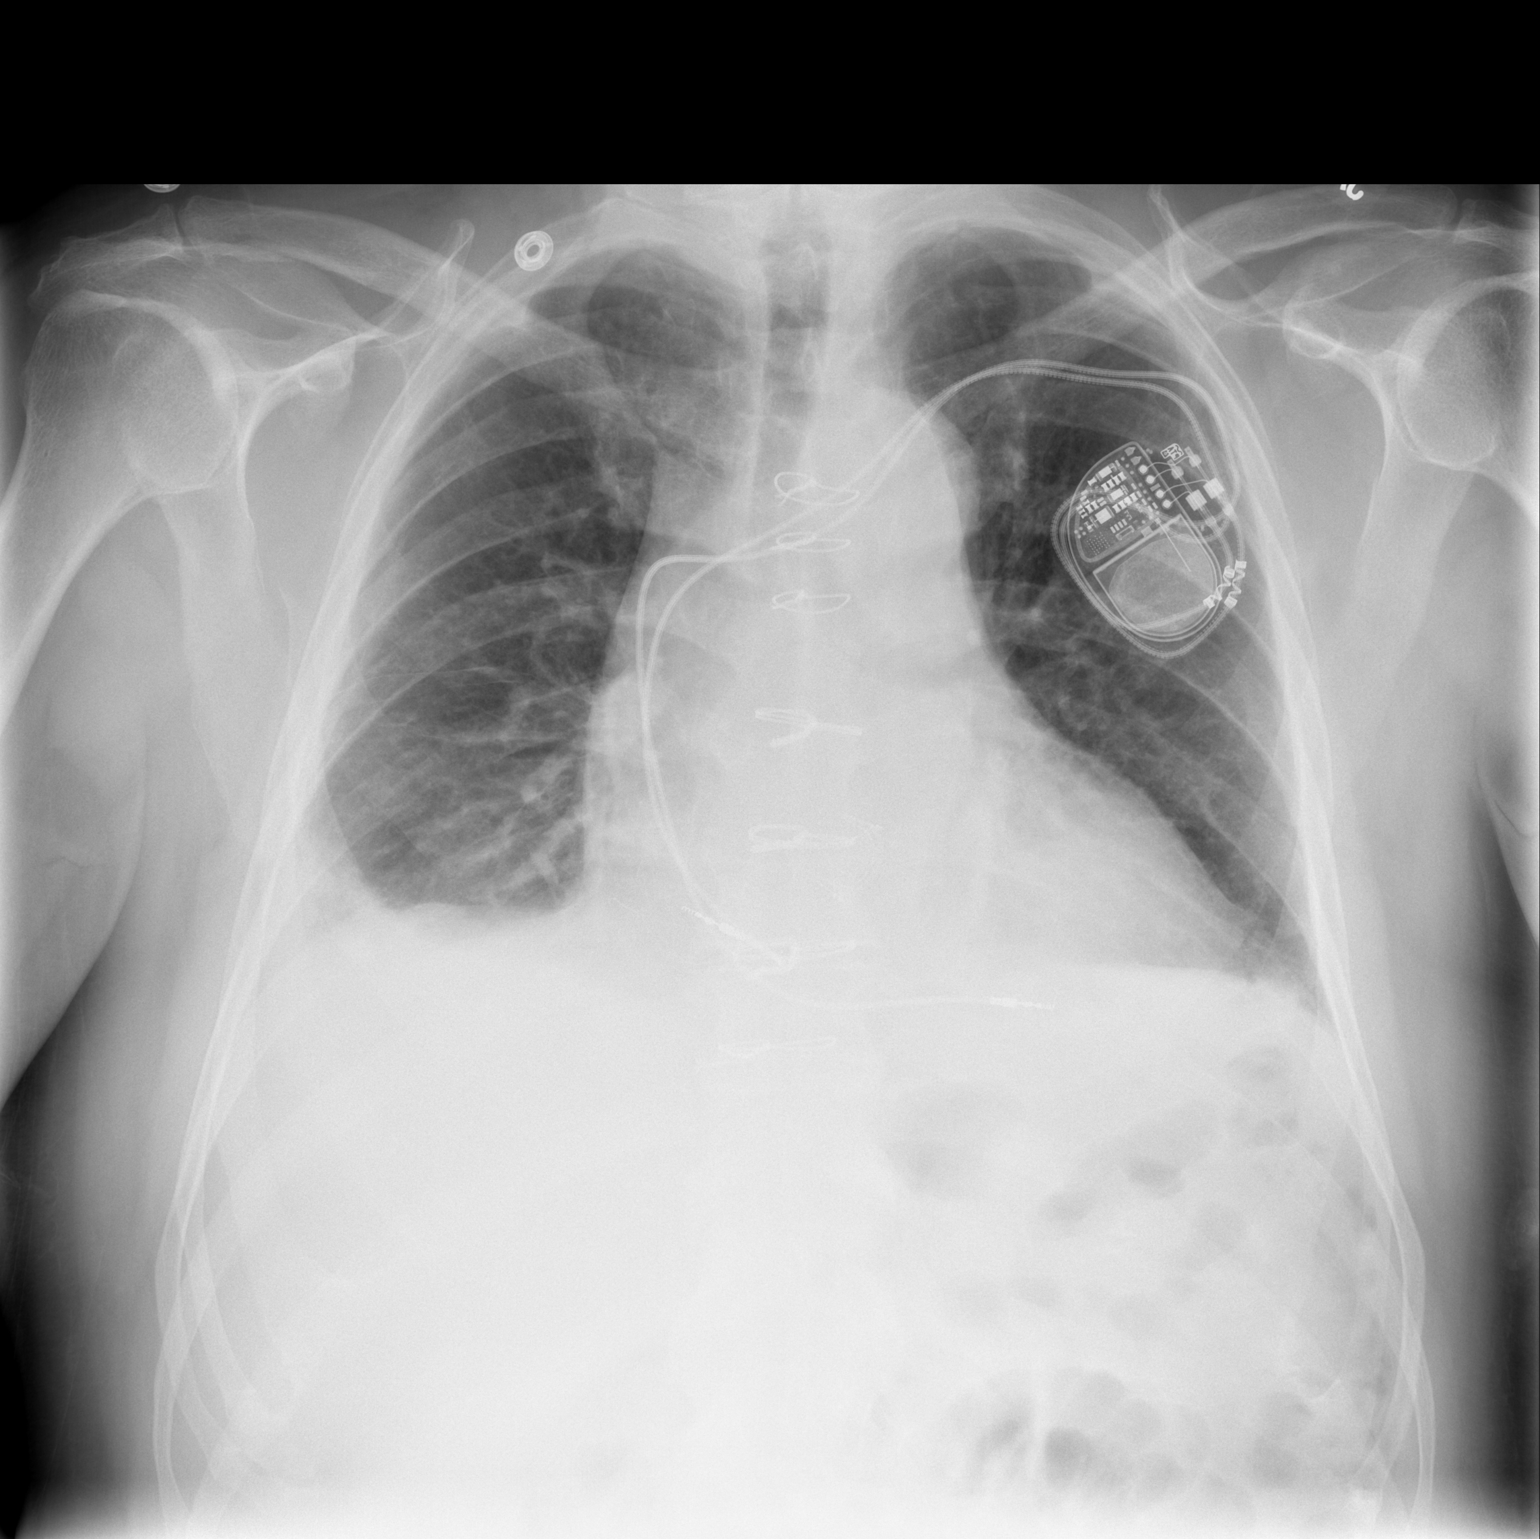

[w chest lat]
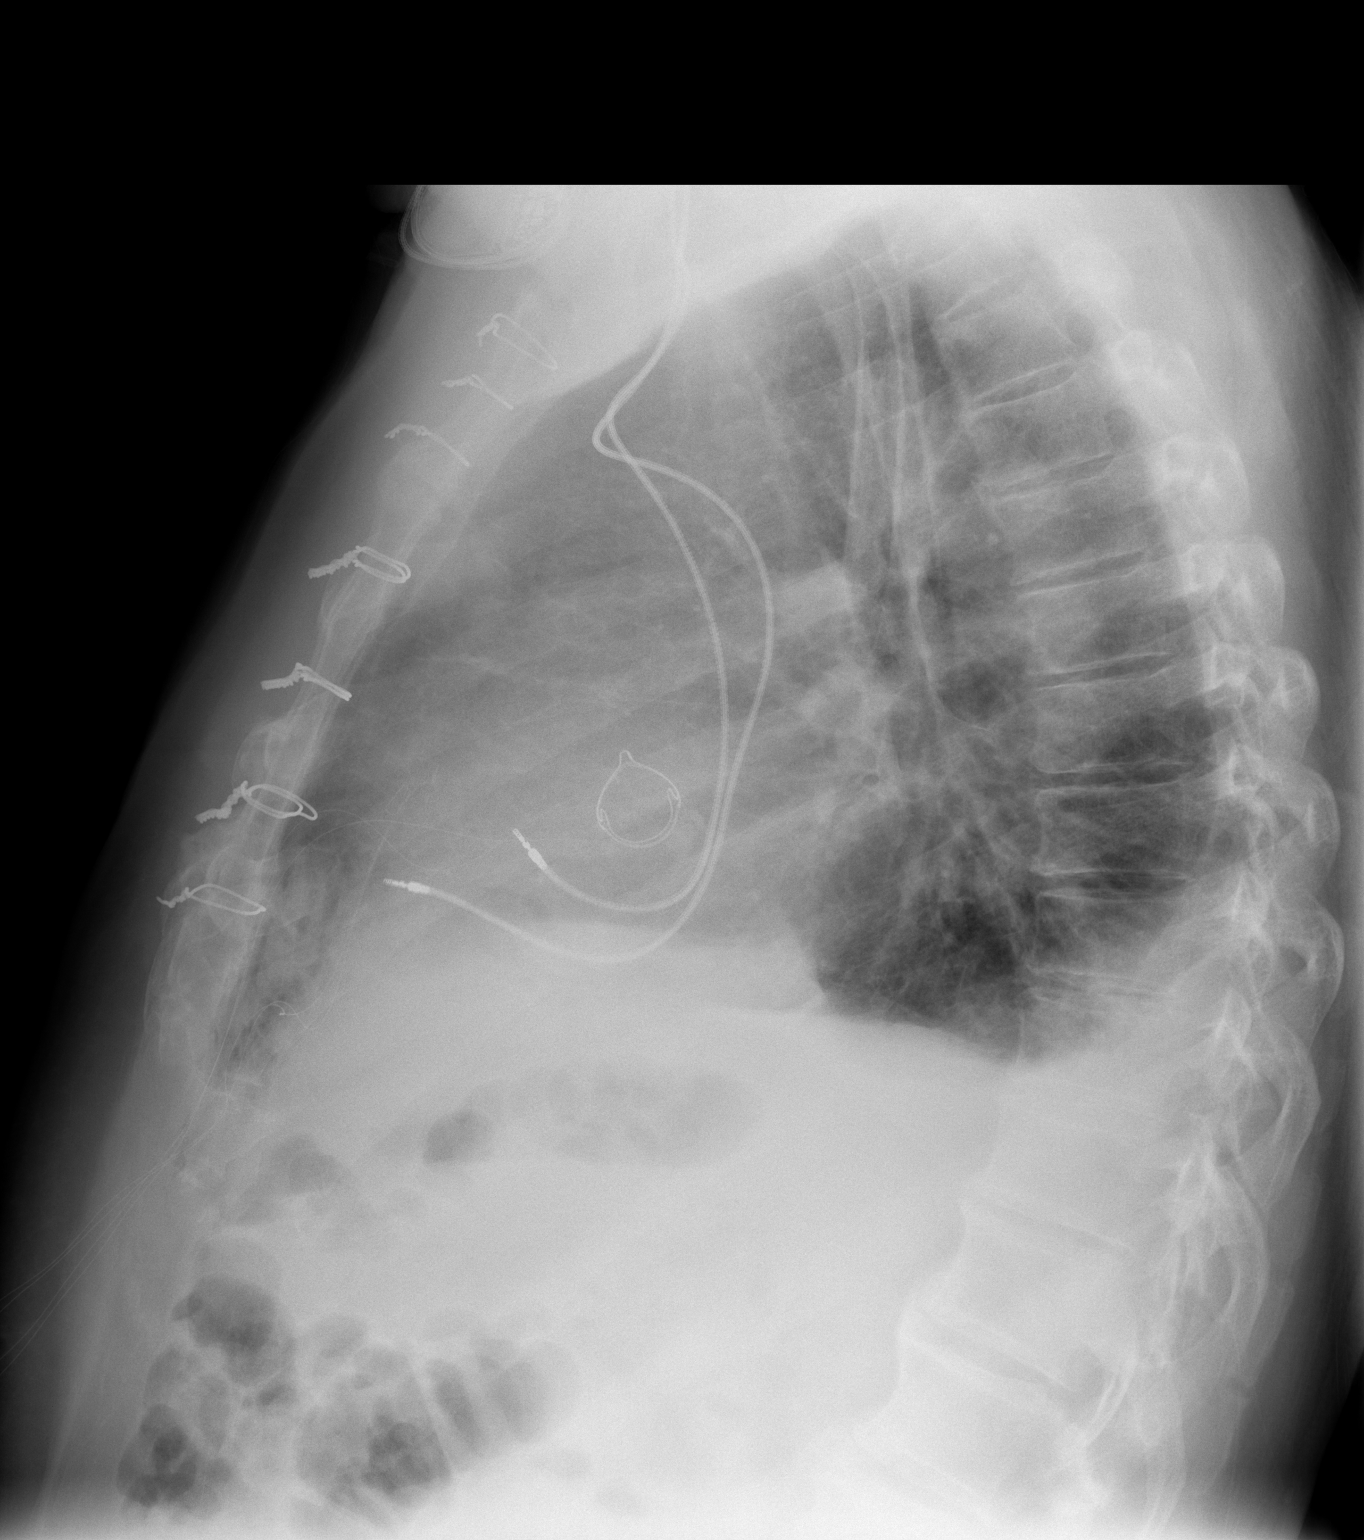

[2 of 2 positions shown; findings below may reference images not displayed]

FINDINGS: The right IJ vascular sheath has been removed.  Bilateral
right greater left pleural effusions are seen.  The heart is at the
upper limits of normal in size.  Aortic valve replacement is noted.
A left chest wall pacemaker is in place.  The heart is at the upper
limits of normal in size but is unchanged.  The upper abdomen and
osseous structures are also unchanged.
IMPRESSION: Bilateral pleural effusions without other acute findings.

## 2012-12-31 ENCOUNTER — Other Ambulatory Visit (HOSPITAL_COMMUNITY): Payer: Self-pay | Admitting: Cardiovascular Disease

## 2012-12-31 DIAGNOSIS — I351 Nonrheumatic aortic (valve) insufficiency: Secondary | ICD-10-CM

## 2013-01-08 ENCOUNTER — Ambulatory Visit (HOSPITAL_COMMUNITY)
Admission: RE | Admit: 2013-01-08 | Discharge: 2013-01-08 | Disposition: A | Discharge status: Home / Self Care | Payer: Medicare Other | Source: Ambulatory Visit | Attending: Cardiovascular Disease | Admitting: Cardiovascular Disease

## 2013-01-08 DIAGNOSIS — I351 Nonrheumatic aortic (valve) insufficiency: Secondary | ICD-10-CM

## 2013-01-08 DIAGNOSIS — I359 Nonrheumatic aortic valve disorder, unspecified: Secondary | ICD-10-CM | POA: Insufficient documentation

## 2013-01-08 NOTE — Progress Notes (Signed)
2D Echo Performed 01/08/2013    Roselin Wiemann, RCS  

## 2013-02-17 ENCOUNTER — Telehealth: Payer: Self-pay | Admitting: Cardiovascular Disease

## 2013-02-17 NOTE — Telephone Encounter (Signed)
Needs a call back about his medication and when he needs to do his remote pacer check. Pt stated that his bp medication price went up a significant amount and wants to know what he can do. He took his last pill this morning.

## 2013-02-19 ENCOUNTER — Other Ambulatory Visit: Payer: Self-pay | Admitting: Pharmacist Clinician (PhC)/ Clinical Pharmacy Specialist

## 2013-02-19 MED ORDER — IRBESARTAN 300 MG PO TABS: 300.0000 mg | ORAL_TABLET | Freq: Every day | ORAL | Status: DC

## 2013-02-19 NOTE — Telephone Encounter (Signed)
Switched Diovan 320 to Irbesartan 300.  Pt to schedule follow up BP check in 1 month if not scheduled for MD/pacer check soon.  Pt sent to scheduling.

## 2013-02-19 NOTE — Telephone Encounter (Signed)
Switched pt from [name] [name], copay went to $170/month, to irbesartan 300mg 

## 2013-03-21 ENCOUNTER — Other Ambulatory Visit: Payer: Self-pay | Admitting: Cardiovascular Disease

## 2013-03-21 DIAGNOSIS — I441 Atrioventricular block, second degree: Secondary | ICD-10-CM

## 2013-03-21 LAB — PACEMAKER DEVICE OBSERVATION

## 2013-04-01 ENCOUNTER — Other Ambulatory Visit: Payer: Self-pay | Admitting: *Deleted

## 2013-04-01 MED ORDER — SIMVASTATIN 80 MG PO TABS: 80.0000 mg | ORAL_TABLET | Freq: Every day | ORAL | Status: DC

## 2013-04-01 NOTE — Telephone Encounter (Signed)
Refill Error received.  No medication listed.  Paper chart requested.  Call to pharmacy and informed pt needs refill on Simvastatin 80 mg daily.  Refill(s) sent to pharmacy.  #90 no refills.  Appt due 06/2013.

## 2013-04-02 ENCOUNTER — Ambulatory Visit (INDEPENDENT_AMBULATORY_CARE_PROVIDER_SITE_OTHER): Payer: Medicare Other | Admitting: Pharmacist Clinician (PhC)/ Clinical Pharmacy Specialist

## 2013-04-02 VITALS — BP 138/78 | HR 60

## 2013-04-02 DIAGNOSIS — I10 Essential (primary) hypertension: Secondary | ICD-10-CM

## 2013-04-02 MED ORDER — METOPROLOL TARTRATE 50 MG PO TABS: 50.0000 mg | ORAL_TABLET | Freq: Two times a day (BID) | ORAL | Status: DC

## 2013-04-02 NOTE — Patient Instructions (Addendum)
Your blood pressure today is okay at 138/78 .  Take your BP meds as follows:  AM: metoprolol 50mg          irbesartan 300mg    PM: metoprolol 50mg          Amlodipine 5mg   Bring all of your meds, your BP cuff and your record of home blood pressures to your next appointment.  Exercise as you're able, try to walk approximately 30 minutes per day.  Keep salt intake to a minimum, especially watch canned and prepared boxed foods.  Eat more fresh fruits and vegetables and fewer canned items.  Avoid eating in fast food restaurants.

## 2013-04-03 ENCOUNTER — Encounter: Payer: Self-pay | Admitting: Pharmacist Clinician (PhC)/ Clinical Pharmacy Specialist

## 2013-04-03 NOTE — Progress Notes (Signed)
  HPI:  [name] [name] is a 67 y.o. male with a PMH below who presents today for a follow up blood pressure check.  He has no complaints today.  Patient admits to having missed several doses of metoprolol over the past week as he had trouble getting med refilled.  Patient has tried to increase exercise during the summer months with gardening and walking more.  Otherwise, no exercise, he quit smoking many years ago and does not drink alcohol.     Current Outpatient Prescriptions  Medication Sig Dispense Refill  . amLODipine (NORVASC) 5 MG tablet Take 5 mg by mouth daily.      [name] aspirin EC 81 MG tablet Take 81 mg by mouth daily.      [name] glipiZIDE-metformin (METAGLIP) 5-500 MG per tablet Take 1 tablet by mouth 2 (two) times daily before a meal.      . irbesartan (AVAPRO) 300 MG tablet Take 1 tablet (300 mg total) by mouth daily.  30 tablet  6  . metoprolol (LOPRESSOR) 50 MG tablet Take 1 tablet (50 mg total) by mouth 2 (two) times daily.  180 tablet  1  . simvastatin (ZOCOR) 80 MG tablet Take 1 tablet (80 mg total) by mouth daily.  90 tablet  0   No current facility-administered medications for this visit.    Not on File  Past Medical History  Diagnosis Date  . Aortic insufficiency   . Hypertension   . Ventricular tachycardia   . Diabetes   . Dyslipidemia     BP today sitting: 138/78 BP today standing:   146/84  ASSESSMENT AND PLAN: Patients blood pressure is normal today.  I suspect that it may go even a little lowe once he gets the metoprolol back into his system.  Being diabetic, our goal is to keep his BP <140/89.  He will continue with current medications and follow up with Dr. [name] in 3 months.  He has been advised to call the office if he notices an increase in his pressure or has other problems.

## 2013-04-04 ENCOUNTER — Encounter: Payer: Self-pay | Admitting: Pharmacist Clinician (PhC)/ Clinical Pharmacy Specialist

## 2013-04-23 ENCOUNTER — Encounter: Payer: Self-pay | Admitting: *Deleted

## 2013-04-23 LAB — REMOTE PACEMAKER DEVICE
AL IMPEDENCE PM: 568 Ohm
RV LEAD IMPEDENCE PM: 456 Ohm

## 2013-04-25 ENCOUNTER — Encounter: Payer: Self-pay | Admitting: Cardiovascular Disease

## 2013-05-23 ENCOUNTER — Other Ambulatory Visit: Payer: Self-pay | Admitting: *Deleted

## 2013-05-23 MED ORDER — AMLODIPINE BESYLATE 5 MG PO TABS: 5.0000 mg | ORAL_TABLET | Freq: Every day | ORAL | Status: DC

## 2013-05-23 MED ORDER — ATORVASTATIN CALCIUM 40 MG PO TABS: 40.0000 mg | ORAL_TABLET | Freq: Every day | ORAL | Status: DC

## 2013-05-23 NOTE — Telephone Encounter (Signed)
Interaction between simvastatin 80mg  and amlodipine 5mg .  Has been on the simvastatin since 2008 and the amlodipine for 8-10 months.  States no problems with muscle aches/pains.  Advised pt that we will go ahead and switch simvastatin to atorvastatin 40mg  qd.  Cholesterol labs from 2013 show good LDL and TG, low HDL.   Pt voiced agreement, rx sent to [facility] [address]

## 2013-05-23 NOTE — Telephone Encounter (Signed)
**  Walk-In**  Message received to refill simvastatin 80mg  and amlodipine 5mg .  Call to pt and informed simvastatin refilled 7.22.14 w/ enough to last until Oct appt.  Pt stated he is out of both.  Will send 30-day for both.  Pt verbalized understanding and agreed w/ plan.  ?Interaction w/ simvastatin and amlodipine.  Will defer to Belenda Cruise, PharmD.  Message forwarded to Belenda Cruise and paper chart# [id number] on her desk.

## 2013-06-17 ENCOUNTER — Encounter: Payer: Self-pay | Admitting: Cardiovascular Disease

## 2013-06-17 ENCOUNTER — Ambulatory Visit (INDEPENDENT_AMBULATORY_CARE_PROVIDER_SITE_OTHER): Payer: Medicare Other | Admitting: Cardiovascular Disease

## 2013-06-17 VITALS — BP 134/74 | HR 72 | Resp 16 | Ht 71.0 in | Wt 212.7 lb

## 2013-06-17 DIAGNOSIS — I441 Atrioventricular block, second degree: Secondary | ICD-10-CM

## 2013-06-17 DIAGNOSIS — I519 Heart disease, unspecified: Secondary | ICD-10-CM

## 2013-06-17 DIAGNOSIS — I4891 Unspecified atrial fibrillation: Secondary | ICD-10-CM

## 2013-06-17 DIAGNOSIS — E119 Type 2 diabetes mellitus without complications: Secondary | ICD-10-CM

## 2013-06-17 DIAGNOSIS — R5381 Other malaise: Secondary | ICD-10-CM

## 2013-06-17 DIAGNOSIS — I10 Essential (primary) hypertension: Secondary | ICD-10-CM

## 2013-06-17 DIAGNOSIS — Z950 Presence of cardiac pacemaker: Secondary | ICD-10-CM

## 2013-06-17 DIAGNOSIS — E782 Mixed hyperlipidemia: Secondary | ICD-10-CM

## 2013-06-17 DIAGNOSIS — E785 Hyperlipidemia, unspecified: Secondary | ICD-10-CM

## 2013-06-17 DIAGNOSIS — Z79899 Other long term (current) drug therapy: Secondary | ICD-10-CM

## 2013-06-17 DIAGNOSIS — Z952 Presence of prosthetic heart valve: Secondary | ICD-10-CM

## 2013-06-17 DIAGNOSIS — Z954 Presence of other heart-valve replacement: Secondary | ICD-10-CM

## 2013-06-17 LAB — PACEMAKER DEVICE OBSERVATION

## 2013-06-17 NOTE — Patient Instructions (Addendum)
Your physician recommends that you return for lab work as soon as possible.  Your physician recommends that you schedule a follow-up appointment in: 3 months with Dr.Croitoru

## 2013-06-18 ENCOUNTER — Encounter: Payer: Self-pay | Admitting: Cardiovascular Disease

## 2013-06-18 DIAGNOSIS — Z950 Presence of cardiac pacemaker: Secondary | ICD-10-CM | POA: Insufficient documentation

## 2013-06-18 DIAGNOSIS — I10 Essential (primary) hypertension: Secondary | ICD-10-CM | POA: Insufficient documentation

## 2013-06-18 DIAGNOSIS — I441 Atrioventricular block, second degree: Secondary | ICD-10-CM | POA: Insufficient documentation

## 2013-06-18 DIAGNOSIS — Z952 Presence of prosthetic heart valve: Secondary | ICD-10-CM | POA: Insufficient documentation

## 2013-06-18 DIAGNOSIS — E782 Mixed hyperlipidemia: Secondary | ICD-10-CM | POA: Insufficient documentation

## 2013-06-18 DIAGNOSIS — I480 Paroxysmal atrial fibrillation: Secondary | ICD-10-CM | POA: Insufficient documentation

## 2013-06-18 DIAGNOSIS — I4891 Unspecified atrial fibrillation: Secondary | ICD-10-CM | POA: Insufficient documentation

## 2013-06-18 DIAGNOSIS — E119 Type 2 diabetes mellitus without complications: Secondary | ICD-10-CM | POA: Insufficient documentation

## 2013-06-18 LAB — PACEMAKER DEVICE OBSERVATION
AL AMPLITUDE: 2.3 mv
AL IMPEDENCE PM: 552 Ohm
BAMS-0001: 171 {beats}/min
RV LEAD AMPLITUDE: 2 mv
RV LEAD IMPEDENCE PM: 464 Ohm

## 2013-06-18 NOTE — Assessment & Plan Note (Signed)
He reports good control. We'll try to obtain the records from Dr. Allena Katz.

## 2013-06-18 NOTE — Assessment & Plan Note (Signed)
There is rare atrial pacing (approximately 6%) and rare ventricular pacing (between 5 and 15%). Over the last 2 years he had problems with diminished R-wave amplitude. For the first 9 months following pacemaker implantation R-wave amplitude was excellent at around 19-20 mV. Subsequently there was an abrupt reduction in R-wave amplitude down to the 2-3 mV range. There is no initiation of new medications and no history of unusual device trauma or manipulation at the time. Unfortunately the need to reduce R-wave sensing thresholds has led to recurrent problems with T wave over sensing. The post-ventricular blanking period was therefore increased to 320 ms. In order to accomplish this we had to eliminate the VT monitor. If R-wave sensing deteriorates further we may have to place a new right ventricular lead. Otherwise device function is normal. He has an MRI conditional device and MRI conditional leads.

## 2013-06-18 NOTE — Assessment & Plan Note (Signed)
Her pacemaker interrogation has been no recurrence of atrial fibrillation outside of the immediate postop period.

## 2013-06-18 NOTE — Progress Notes (Signed)
Patient ID: [name] [name], male   DOB: [birth year], 67 y.o.   MRN: [id number]      Reason for office visit S/p AVR (biological prosthesis), status post pacemaker (second-degree A-V block)  [name] [name] has not had any serious health problems since we last saw each other in the office. He is apologetic for being "lazy" and not exercising enough. He has not managed to lose any weight. He is borderline obese with a BMI of just under 30. He states that his blood sugar has been well-controlled as has his lipid profile. He denies any problems with chest pain, syncope or shortness of breath with activity. He has not had edema. He had a dual-chamber permanent pacemaker implanted for presyncope in the setting of recurrent second-degree atrioventricular block in November 2011. In January 2012, he underwent aortic valve replacement with a biological prosthesis. Despite the fact that he has a relatively small valve (21 mm) by echocardiography has excellent transvalvular gradients. Unfortunately he also has mild to moderate aortic insufficiency (unclear if this is periprostatic or intra-valvular). It has never been symptomatic and does not appear to be progressive by serial echocardiography. He had brief postoperative atrial fibrillation. His pacing has not shown any evidence of recurrence beyond the first 30 postoperative . His pacemaker had an excellent atrial and ventricular parameters until August of 2012. The subsequent abrupt reduction in R-wave sensing. Because of this we recently began having problems with T wave over sensing. Mostly this has led to artifactual diagnoses of nonsustained ventricular tachycardia, but has not truly impeded normal device function to date.   No Known Allergies  Current Outpatient Prescriptions  Medication Sig Dispense Refill  . amLODipine (NORVASC) 5 MG tablet Take 1 tablet (5 mg total) by mouth daily. Keep appointment for refills  90 tablet  0  . aspirin EC 81 MG tablet Take 81  mg by mouth daily.      [name] atorvastatin (LIPITOR) 40 MG tablet Take 1 tablet (40 mg total) by mouth daily.  90 tablet  0  . glipiZIDE-metformin (METAGLIP) 5-500 MG per tablet Take 1 tablet by mouth 2 (two) times daily before a meal.      . irbesartan (AVAPRO) 300 MG tablet Take 1 tablet (300 mg total) by mouth daily.  30 tablet  6  . metoprolol (LOPRESSOR) 50 MG tablet Take 1 tablet (50 mg total) by mouth 2 (two) times daily.  180 tablet  1   No current facility-administered medications for this visit.    Past Medical History  Diagnosis Date  . Aortic insufficiency   . Hypertension   . Ventricular tachycardia   . Diabetes   . Dyslipidemia     No past surgical history on file.  No family history on file.  History   Social History  . Marital Status: Single    Spouse Name: N/A    Number of Children: N/A  . Years of Education: N/A   Occupational History  . Not on file.   Social History Main Topics  . Smoking status: Never Smoker   . Smokeless tobacco: Not on file  . Alcohol Use: Not on file  . Drug Use: Not on file  . Sexual Activity: Not on file   Other Topics Concern  . Not on file   Social History Narrative  . No narrative on file    Review of systems: The patient specifically denies any chest pain at rest or with exertion, dyspnea at rest or with exertion, orthopnea, paroxysmal  nocturnal dyspnea, syncope, palpitations, focal neurological deficits, intermittent claudication, lower extremity edema, unexplained weight gain, cough, hemoptysis or wheezing.  The patient also denies abdominal pain, nausea, vomiting, dysphagia, diarrhea, constipation, polyuria, polydipsia, dysuria, hematuria, frequency, urgency, abnormal bleeding or bruising, fever, chills, unexpected weight changes, mood swings, change in skin or hair texture, change in voice quality, auditory or visual problems, allergic reactions or rashes, new musculoskeletal complaints other than usual "aches and  pains".   PHYSICAL EXAM BP 134/74  Pulse 72  Resp 16  Ht 5\' 11"  (1.803 m)  Wt 212 lb 11.2 oz (96.48 kg)  BMI 29.68 kg/m2  General: Alert, oriented x3, no distress Head: no evidence of trauma, PERRL, EOMI, no exophtalmos or lid lag, no myxedema, no xanthelasma; normal ears, nose and oropharynx Neck: normal jugular venous pulsations and no hepatojugular reflux; brisk carotid pulses without delay and no carotid bruits Chest: clear to auscultation, no signs of consolidation by percussion or palpation, normal fremitus, symmetrical and full respiratory excursions; well-healed sternotomy scar and left subclavian pacemaker site Cardiovascular: normal position and quality of the apical impulse, regular rhythm, normal first and second heart sounds, 2/6 early peaking aortic ejection murmur, 2/6 diastolic murmur along the sternal right and left borders, rubs or gallops Abdomen: no tenderness or distention, no masses by palpation, no abnormal pulsatility or arterial bruits, normal bowel sounds, no hepatosplenomegaly Extremities: no clubbing, cyanosis or edema; 2+ radial, ulnar and brachial pulses bilaterally; 2+ right femoral, posterior tibial and dorsalis pedis pulses; 2+ left femoral, posterior tibial and dorsalis pedis pulses; no subclavian or femoral bruits Neurological: grossly nonfocal   EKG: Sinus rhythm, left axis deviation, right bundle branch block, left anterior fascicular block, QRS 144 ms, QTC 488 ms  Lipid Panel  No results found for this basename: chol, trig, hdl, cholhdl, vldl, ldlcalc    BMET    Component Value Date/Time   NA 131 DELTA CHECK NOTED* 10/09/2010 0919   K 4.1 10/09/2010 0919   CL 98 10/09/2010 0919   CO2 25 10/09/2010 0919   GLUCOSE 161* 10/09/2010 0919   BUN 9 10/09/2010 0919   CREATININE 0.79 10/09/2010 0919   CALCIUM 8.0* 10/09/2010 0919   GFRNONAA >60 10/09/2010 0919   GFRAA  Value: >60        The eGFR has been calculated using the MDRD equation. This calculation  has not been validated in all clinical situations. eGFR's persistently <60 mL/min signify possible Chronic Kidney Disease. 10/09/2010 0919     ASSESSMENT AND PLAN S/P bioprosthetic AVR - 21 mm [facility], Dr. Zenaida Niece Trigt 09/2010 Ever since early postoperative period, his aortic valve biological prosthesis has had a mild to moderate degree of aortic insufficiency. Jet is very eccentric and it is unclear whether it is peri-annular or intra-valvular. It appears to be stable on serial echo. Despite the small size of the prosthesis the gradients are quite good with a peak of 11 and a mean of 7 mm Hg. He has not had any symptoms to suggest patient valve mismatch. He has preserved left ventricular systolic function. By echo April 2014 there was still moderate left ventricular hypertrophy and evidence of mild diastolic dysfunction. Will need serial followup to see if there is eventual regression of LVH and if there is progression of aortic insufficiency.  Pacemaker- [employer] Revo, Nov 2011 There is rare atrial pacing (approximately 6%) and rare ventricular pacing (between 5 and 15%). Over the last 2 years he had problems with diminished R-wave amplitude. For the first  9 months following pacemaker implantation R-wave amplitude was excellent at around 19-20 mV. Subsequently there was an abrupt reduction in R-wave amplitude down to the 2-3 mV range. There is no initiation of new medications and no history of unusual device trauma or manipulation at the time. Unfortunately the need to reduce R-wave sensing thresholds has led to recurrent problems with T wave over sensing. The post-ventricular blanking period was therefore increased to 320 ms. In order to accomplish this we had to eliminate the VT monitor. If R-wave sensing deteriorates further we may have to place a new right ventricular lead. Otherwise device function is normal. He has an MRI conditional device and MRI conditional leads.  Postoperative atrial  fibrillation Her pacemaker interrogation has been no recurrence of atrial fibrillation outside of the immediate postop period.  HTN (hypertension) Consistently good control  DM2 (diabetes mellitus, type 2) He reports good control. We'll try to obtain the records from Dr. Allena Katz.  Hyperlipidemia Do not have his most recent labs. In April of last year total cholesterol was 127, triglycerides 109, HDL 31 and LDL 74. Been no changes to his medications since. He is encouraged to become physically active trying to lose a little more weight and limit his intake of carbohydrates as this may help with his HDL cholesterol . Note that there was no evidence of any significant coronary stenoses at the time of coronary angiography, although he did have mild scattered plaque.  will check labs today Orders Placed This Encounter  Procedures  . Comp Met (CMET)  . Lipid Profile  . HgB A1c  . CBC  . Pacemaker Device Observation  . EKG 12-Lead   No orders of the defined types were placed in this encounter.    Junious Silk, MD, [facility] [phone] office [phone] pager

## 2013-06-18 NOTE — Assessment & Plan Note (Signed)
Do not have his most recent labs. In April of last year total cholesterol was 127, triglycerides 109, HDL 31 and LDL 74. Been no changes to his medications since. He is encouraged to become physically active trying to lose a little more weight and limit his intake of carbohydrates as this may help with his HDL cholesterol . Note that there was no evidence of any significant coronary stenoses at the time of coronary angiography, although he did have mild scattered plaque.

## 2013-06-18 NOTE — Assessment & Plan Note (Signed)
Ever since early postoperative period, his aortic valve biological prosthesis has had a mild to moderate degree of aortic insufficiency. Jet is very eccentric and it is unclear whether it is peri-annular or intra-valvular. It appears to be stable on serial echo. Despite the small size of the prosthesis the gradients are quite good with a peak of 11 and a mean of 7 mm Hg. He has not had any symptoms to suggest patient valve mismatch. He has preserved left ventricular systolic function. By echo April 2014 there was still moderate left ventricular hypertrophy and evidence of mild diastolic dysfunction. Will need serial followup to see if there is eventual regression of LVH and if there is progression of aortic insufficiency.

## 2013-06-18 NOTE — Assessment & Plan Note (Signed)
Consistently good control

## 2013-06-18 NOTE — Assessment & Plan Note (Signed)
Also has a right bundle branch block,  left anterior fascicular block. He is not pacemaker dependent.

## 2013-06-23 ENCOUNTER — Encounter: Payer: Self-pay | Admitting: Cardiovascular Disease

## 2013-06-24 LAB — COMPREHENSIVE METABOLIC PANEL
ALT: 22 U/L (ref 0–53)
AST: 22 U/L (ref 0–37)
Alkaline Phosphatase: 56 U/L (ref 39–117)
BUN: 12 mg/dL (ref 6–23)
Creat: 0.83 mg/dL (ref 0.50–1.35)
Potassium: 4.7 mEq/L (ref 3.5–5.3)

## 2013-06-24 LAB — HEMOGLOBIN A1C: Hgb A1c MFr Bld: 5.9 % — ABNORMAL HIGH (ref <5.7)

## 2013-06-24 LAB — LIPID PANEL
HDL: 29 mg/dL — ABNORMAL LOW (ref >39)
LDL Cholesterol: 65 mg/dL (ref 0–99)
Total CHOL/HDL Ratio: 4 Ratio

## 2013-06-24 LAB — CBC
MCH: 30.5 pg (ref 26.0–34.0)
MCHC: 35.2 g/dL (ref 30.0–36.0)
Platelets: 261 10*3/uL (ref 150–400)
RDW: 13.3 % (ref 11.5–15.5)

## 2013-08-19 ENCOUNTER — Other Ambulatory Visit: Payer: Self-pay | Admitting: Cardiovascular Disease

## 2013-08-19 NOTE — Telephone Encounter (Signed)
Rx was sent to pharmacy electronically. 

## 2013-10-01 ENCOUNTER — Encounter: Payer: Medicare Other | Admitting: Cardiovascular Disease

## 2013-10-02 ENCOUNTER — Encounter: Payer: Self-pay | Admitting: *Deleted

## 2013-10-08 ENCOUNTER — Other Ambulatory Visit: Payer: Self-pay | Admitting: *Deleted

## 2013-10-08 MED ORDER — IRBESARTAN 300 MG PO TABS: 300.0000 mg | ORAL_TABLET | Freq: Every day | ORAL | Status: DC

## 2013-10-09 ENCOUNTER — Other Ambulatory Visit: Payer: Self-pay | Admitting: Pharmacist Clinician (PhC)/ Clinical Pharmacy Specialist

## 2013-10-09 ENCOUNTER — Ambulatory Visit (INDEPENDENT_AMBULATORY_CARE_PROVIDER_SITE_OTHER): Payer: Medicare Other | Admitting: Cardiovascular Disease

## 2013-10-09 ENCOUNTER — Encounter: Payer: Self-pay | Admitting: Cardiovascular Disease

## 2013-10-09 VITALS — BP 122/60 | HR 88 | Ht 71.0 in | Wt 215.8 lb

## 2013-10-09 DIAGNOSIS — I359 Nonrheumatic aortic valve disorder, unspecified: Secondary | ICD-10-CM

## 2013-10-09 DIAGNOSIS — Z950 Presence of cardiac pacemaker: Secondary | ICD-10-CM

## 2013-10-09 DIAGNOSIS — I351 Nonrheumatic aortic (valve) insufficiency: Secondary | ICD-10-CM

## 2013-10-09 DIAGNOSIS — I441 Atrioventricular block, second degree: Secondary | ICD-10-CM

## 2013-10-09 DIAGNOSIS — I10 Essential (primary) hypertension: Secondary | ICD-10-CM

## 2013-10-09 DIAGNOSIS — E119 Type 2 diabetes mellitus without complications: Secondary | ICD-10-CM

## 2013-10-09 DIAGNOSIS — Z954 Presence of other heart-valve replacement: Secondary | ICD-10-CM

## 2013-10-09 DIAGNOSIS — I25810 Atherosclerosis of coronary artery bypass graft(s) without angina pectoris: Secondary | ICD-10-CM

## 2013-10-09 DIAGNOSIS — E785 Hyperlipidemia, unspecified: Secondary | ICD-10-CM

## 2013-10-09 DIAGNOSIS — Z952 Presence of prosthetic heart valve: Secondary | ICD-10-CM

## 2013-10-09 LAB — PACEMAKER DEVICE OBSERVATION

## 2013-10-09 MED ORDER — IRBESARTAN 300 MG PO TABS: 300.0000 mg | ORAL_TABLET | Freq: Every day | ORAL | Status: DC

## 2013-10-09 MED ORDER — AMLODIPINE BESYLATE 5 MG PO TABS: 5.0000 mg | ORAL_TABLET | Freq: Every day | ORAL | Status: DC

## 2013-10-09 NOTE — Assessment & Plan Note (Signed)
Excellent R waves of approximately 19-20 mV were recorded for the first 9 months following device implantation after which there was an abrupt reduction in R wave amplitude. The mechanism for this is poorly understood. Chronic problems with T wave over sensing are not an apparent problem on the current device check. He is a very low frequency of ventricular pacing. No changes are made to device settings today.

## 2013-10-09 NOTE — Assessment & Plan Note (Signed)
Excellent control.  No changes made. 

## 2013-10-09 NOTE — Assessment & Plan Note (Signed)
Reevaluate the degree of aortic insufficiency and left ventricular parameters at yearly intervals. At this point he does not have symptoms to suggest that this is hemodynamically important. Echo will also be useful to evaluate for signs of regression of moderate LVH after valve replacement

## 2013-10-09 NOTE — Assessment & Plan Note (Signed)
Excellent glycemic control. May even need to reduce antidiabetic medication

## 2013-10-09 NOTE — Patient Instructions (Addendum)
Your physician has requested that you have an echocardiogram AFTER April 23rd. Echocardiography is a painless test that uses sound waves to create images of your heart. It provides your doctor with information about the size and shape of your heart and how well your heart's chambers and valves are working. This procedure takes approximately one hour. There are no restrictions for this procedure.  Remote monitoring is used to monitor your pacemaker from home. This monitoring reduces the number of office visits required to check your device to one time per year. It allows [place] to keep an eye on the functioning of your device to ensure it is working properly. You are scheduled for a device check from home on 01-12-2014. You may send your transmission at any time that day. If you have a wireless device, the transmission will be sent automatically. After your physician reviews your transmission, you will receive a postcard with your next transmission date.  Your physician recommends that you schedule a follow-up appointment in: 12 months with Dr.Croitoru  You have been referred to Cardiac rehab-Phase 4

## 2013-10-09 NOTE — Progress Notes (Signed)
Patient ID: [name] [name], male   DOB: 08/24/1946, 68 y.o.   MRN: [id number]      Reason for office visit Bioprosthetic aortic valve insufficiency, second degree AV block, dual-chamber pacemaker, hypertension, hyperlipidemia   Mr. Tomlin done well since his last appointment in October. He has not had any new health challenges. Unfortunately he has gained some weight. Despite his diabetes control remains excellent as does his lipid profile. He has not had issues with shortness of breath, chest pain, palpitations, syncope or lower showed edema.  He underwent aortic valve replacement with a 21 mm biological prosthesis in January of 2012 for symptomatic aortic stenosis. He has developed mild to moderate aortic insufficiency early on in this prosthesis. He has an audible murmur. Serial echocardiograms do not appear to show that this is a progressive disorder but it is early to tell.  He had atrial fibrillation in the immediate postoperative period. His pacemaker has not shown any evidence of recurrent atrial fibrillation since that time and he is no longer taking anticoagulants.  He had syncope presumably related to bradycardia from second-degree atrioventricular block and received a dual-chamber permanent pacemaker in November of 2011. He has a MRI conditional Medtronic Revo device.  No Known Allergies  Current Outpatient Prescriptions  Medication Sig Dispense Refill  . amLODipine (NORVASC) 5 MG tablet Take 1 tablet (5 mg total) by mouth daily. Keep appointment for refills  90 tablet  3  . aspirin EC 81 MG tablet Take 81 mg by mouth daily.      . atorvastatin (LIPITOR) 40 MG tablet TAKE 1 TABLET (40 MG TOTAL) BY MOUTH DAILY.  90 tablet  3  . glipiZIDE-metformin (METAGLIP) 5-500 MG per tablet Take 1 tablet by mouth 2 (two) times daily before a meal.      . irbesartan (AVAPRO) 300 MG tablet Take 1 tablet (300 mg total) by mouth daily.  90 tablet  3  . metoprolol (LOPRESSOR) 50 MG tablet Take 1  tablet (50 mg total) by mouth 2 (two) times daily.  180 tablet  1   No current facility-administered medications for this visit.    Past Medical History  Diagnosis Date  . Aortic insufficiency   . Hypertension   . Ventricular tachycardia   . Diabetes   . Dyslipidemia   . AV block, 2nd degree   . Atrial fibrillation   . Ventricular tachycardia, non-sustained   . Mitral insufficiency   . RBBB   . LAFB (left anterior fascicular block)     Past Surgical History  Procedure Laterality Date  . Aortic valve replacement  10/06/2010    [employer] Magn[employer] Ease prosthesis  . Permanent pacemaker insertion  07/21/2010    Medtronic  . Back surgery    . Cardiac catheterization  06/30/2010    nonobstructive CAD, heavily ca+ AOV  . Nm myocar perf wall motion  02/28/2007    normal    No family history on file.  History   Social History  . Marital Status: Single    Spouse Name: N/A    Number of Children: N/A  . Years of Education: N/A   Occupational History  . Not on file.   Social History Main Topics  . Smoking status: Never Smoker   . Smokeless tobacco: Not on file  . Alcohol Use: No  . Drug Use: No  . Sexual Activity: Not on file   Other Topics Concern  . Not on file   Social History Narrative  . No narrative   on file    Review of systems: The patient specifically denies any chest pain at rest or with exertion, dyspnea at rest or with exertion, orthopnea, paroxysmal nocturnal dyspnea, syncope, palpitations, focal neurological deficits, intermittent claudication, lower extremity edema, unexplained weight gain, cough, hemoptysis or wheezing.  The patient also denies abdominal pain, nausea, vomiting, dysphagia, diarrhea, constipation, polyuria, polydipsia, dysuria, hematuria, frequency, urgency, abnormal bleeding or bruising, fever, chills, unexpected weight changes, mood swings, change in skin or hair texture, change in voice quality, auditory or visual problems, allergic  reactions or rashes, new musculoskeletal complaints other than usual "aches and pains".   PHYSICAL EXAM BP 122/60  Pulse 88  Ht 5' 11" (1.803 m)  Wt 97.886 kg (215 lb 12.8 oz)  BMI 30.11 kg/m2 General: Alert, oriented x3, no distress  Head: no evidence of trauma, PERRL, EOMI, no exophtalmos or lid lag, no myxedema, no xanthelasma; normal ears, nose and oropharynx  Neck: normal jugular venous pulsations and no hepatojugular reflux; brisk carotid pulses without delay and no carotid bruits  Chest: clear to auscultation, no signs of consolidation by percussion or palpation, normal fremitus, symmetrical and full respiratory excursions; well-healed sternotomy scar and left subclavian pacemaker site  Cardiovascular: normal position and quality of the apical impulse, regular rhythm, normal first and second heart sounds, 2/6 early peaking aortic ejection murmur, 2/6 diastolic murmur along the sternal right and left borders, rubs or gallops  Abdomen: no tenderness or distention, no masses by palpation, no abnormal pulsatility or arterial bruits, normal bowel sounds, no hepatosplenomegaly  Extremities: no clubbing, cyanosis or edema; 2+ radial, ulnar and brachial pulses bilaterally; 2+ right femoral, posterior tibial and dorsalis pedis pulses; 2+ left femoral, posterior tibial and dorsalis pedis pulses; no subclavian or femoral bruits  Neurological: grossly nonfocal   EKG: Sinus rhythm, right bundle branch block and left anterior fascicular block  Lipid Panel     Component Value Date/Time   CHOL 116 06/24/2013 1001   TRIG 110 06/24/2013 1001   HDL 29* 06/24/2013 1001   CHOLHDL 4.0 06/24/2013 1001   VLDL 22 06/24/2013 1001   LDLCALC 65 06/24/2013 1001    BMET    Component Value Date/Time   NA 142 06/24/2013 1001   K 4.7 06/24/2013 1001   CL 106 06/24/2013 1001   CO2 31 06/24/2013 1001   GLUCOSE 78 06/24/2013 1001   BUN 12 06/24/2013 1001   CREATININE 0.83 06/24/2013 1001   CREATININE  0.79 10/09/2010 0919   CALCIUM 9.6 06/24/2013 1001   GFRNONAA >60 10/09/2010 0919   GFRAA  Value: >60        The eGFR has been calculated using the MDRD equation. This calculation has not been validated in all clinical situations. eGFR's persistently <60 mL/min signify possible Chronic Kidney Disease. 10/09/2010 0919     ASSESSMENT AND PLAN S/P bioprosthetic AVR - 21 mm [employer], Dr. Van Trigt 09/2010 Reevaluate the degree of aortic insufficiency and left ventricular parameters at yearly intervals. At this point he does not have symptoms to suggest that this is hemodynamically important. Echo will also be useful to evaluate for signs of regression of moderate LVH after valve replacement  Pacemaker- Medtronic Revo, Nov 2011 Excellent R waves of approximately 19-20 mV were recorded for the first 9 months following device implantation after which there was an abrupt reduction in R wave amplitude. The mechanism for this is poorly understood. Chronic problems with T wave over sensing are not an apparent problem on the current device   check. He is a very low frequency of ventricular pacing. No changes are made to device settings today.  DM2 (diabetes mellitus, type 2) Excellent glycemic control. May even need to reduce antidiabetic medication  Hyperlipidemia With the exception of low HDL cholesterol, all his lipid parameters are within the desirable range. Encourage weight loss and physical exercise.   Orders Placed This Encounter  Procedures  . AMB referral to cardiac rehabilitation  . 2D Echocardiogram without contrast   Meds ordered this encounter  Medications  . irbesartan (AVAPRO) 300 MG tablet    Sig: Take 1 tablet (300 mg total) by mouth daily.    Dispense:  90 tablet    Refill:  3  . amLODipine (NORVASC) 5 MG tablet    Sig: Take 1 tablet (5 mg total) by mouth daily. Keep appointment for refills    Dispense:  90 tablet    Refill:  3    CROITORU,MIHAI  Mihai Croitoru,  MD, FACC [facility] [phone] office [phone] pager   

## 2013-10-09 NOTE — Assessment & Plan Note (Signed)
With the exception of low HDL cholesterol, all his lipid parameters are within the desirable range. Encourage weight loss and physical exercise.

## 2013-10-16 ENCOUNTER — Other Ambulatory Visit: Payer: Self-pay | Admitting: Cardiovascular Disease

## 2013-10-16 NOTE — Telephone Encounter (Signed)
Rx was sent to pharmacy electronically. 

## 2013-10-17 LAB — MDC_IDC_ENUM_SESS_TYPE_INCLINIC
Battery Voltage: 3 V
Brady Statistic AP VP Percent: 0.1 % — CL
Brady Statistic AS VP Percent: 0.1 %
Lead Channel Pacing Threshold Amplitude: 1 V
Lead Channel Pacing Threshold Pulse Width: 0.4 ms
Lead Channel Pacing Threshold Pulse Width: 0.8 ms
Lead Channel Sensing Intrinsic Amplitude: 2.7 mV
Lead Channel Setting Sensing Sensitivity: 0.6 mV
MDC IDC MSMT LEADCHNL RA IMPEDANCE VALUE: 512 Ohm
MDC IDC MSMT LEADCHNL RA PACING THRESHOLD AMPLITUDE: 1 V
MDC IDC MSMT LEADCHNL RA SENSING INTR AMPL: 2.2 mV
MDC IDC MSMT LEADCHNL RV IMPEDANCE VALUE: 472 Ohm
MDC IDC SET LEADCHNL RA PACING AMPLITUDE: 2 V
MDC IDC SET LEADCHNL RV PACING AMPLITUDE: 2.5 V
MDC IDC SET LEADCHNL RV PACING PULSEWIDTH: 0.4 ms
MDC IDC STAT BRADY AP VS PERCENT: 5.9 %
MDC IDC STAT BRADY AS VS PERCENT: 94 %
Zone Setting Detection Interval: 400 ms
Zone Setting Detection Interval: 400 ms

## 2013-10-20 ENCOUNTER — Encounter (HOSPITAL_COMMUNITY)
Admission: RE | Admit: 2013-10-20 | Discharge: 2013-10-20 | Disposition: A | Discharge status: Home / Self Care | Payer: Medicare Other | Source: Ambulatory Visit | Attending: Cardiovascular Disease | Admitting: Cardiovascular Disease

## 2013-10-20 DIAGNOSIS — I452 Bifascicular block: Secondary | ICD-10-CM | POA: Insufficient documentation

## 2013-10-20 DIAGNOSIS — E119 Type 2 diabetes mellitus without complications: Secondary | ICD-10-CM | POA: Insufficient documentation

## 2013-10-20 DIAGNOSIS — Z952 Presence of prosthetic heart valve: Secondary | ICD-10-CM | POA: Insufficient documentation

## 2013-10-20 DIAGNOSIS — I4729 Other ventricular tachycardia: Secondary | ICD-10-CM | POA: Insufficient documentation

## 2013-10-20 DIAGNOSIS — Z5189 Encounter for other specified aftercare: Secondary | ICD-10-CM | POA: Insufficient documentation

## 2013-10-20 DIAGNOSIS — I472 Ventricular tachycardia, unspecified: Secondary | ICD-10-CM | POA: Insufficient documentation

## 2013-10-20 DIAGNOSIS — Z7982 Long term (current) use of aspirin: Secondary | ICD-10-CM | POA: Insufficient documentation

## 2013-10-20 DIAGNOSIS — Z950 Presence of cardiac pacemaker: Secondary | ICD-10-CM | POA: Insufficient documentation

## 2013-10-20 DIAGNOSIS — I080 Rheumatic disorders of both mitral and aortic valves: Secondary | ICD-10-CM | POA: Insufficient documentation

## 2013-10-22 ENCOUNTER — Encounter (HOSPITAL_COMMUNITY)
Admission: RE | Admit: 2013-10-22 | Discharge: 2013-10-22 | Disposition: A | Discharge status: Home / Self Care | Payer: Medicare Other | Source: Ambulatory Visit | Attending: Cardiovascular Disease | Admitting: Cardiovascular Disease

## 2013-10-24 ENCOUNTER — Encounter (HOSPITAL_COMMUNITY)
Admission: RE | Admit: 2013-10-24 | Discharge: 2013-10-24 | Disposition: A | Discharge status: Home / Self Care | Payer: Medicare Other | Source: Ambulatory Visit | Attending: Cardiovascular Disease | Admitting: Cardiovascular Disease

## 2013-10-27 ENCOUNTER — Encounter (HOSPITAL_COMMUNITY)
Admission: RE | Admit: 2013-10-27 | Discharge: 2013-10-27 | Disposition: A | Discharge status: Home / Self Care | Payer: Medicare Other | Source: Ambulatory Visit | Attending: Cardiovascular Disease | Admitting: Cardiovascular Disease

## 2013-10-27 LAB — GLUCOSE, CAPILLARY
Glucose-Capillary: 55 mg/dL — ABNORMAL LOW (ref 70–99)
Glucose-Capillary: 92 mg/dL (ref 70–99)

## 2013-10-27 NOTE — Progress Notes (Signed)
Patient came to exercise today with a pre-exercise blood sugar of 55. Patient was asymptomatic, ginger ale and banana given. Recheck blood sugar after snack was 92. No exercise today, per patient, he missed lunch and had a bowl of cereal at 0730 this morning. Patient plans to leave here and eat. Patient has appt. with endocrinologist this Friday and will discuss blood sugar at that time.

## 2013-10-29 ENCOUNTER — Encounter (HOSPITAL_COMMUNITY)
Admission: RE | Admit: 2013-10-29 | Discharge: 2013-10-29 | Disposition: A | Discharge status: Home / Self Care | Payer: Medicare Other | Source: Ambulatory Visit | Attending: Cardiovascular Disease | Admitting: Cardiovascular Disease

## 2013-10-31 ENCOUNTER — Encounter (HOSPITAL_COMMUNITY): Payer: Medicare Other

## 2013-11-03 ENCOUNTER — Encounter (HOSPITAL_COMMUNITY)
Admission: RE | Admit: 2013-11-03 | Discharge: 2013-11-03 | Disposition: A | Discharge status: Home / Self Care | Payer: Medicare Other | Source: Ambulatory Visit | Attending: Cardiovascular Disease | Admitting: Cardiovascular Disease

## 2013-11-05 ENCOUNTER — Encounter (HOSPITAL_COMMUNITY)
Admission: RE | Admit: 2013-11-05 | Discharge: 2013-11-05 | Disposition: A | Discharge status: Home / Self Care | Payer: Medicare Other | Source: Ambulatory Visit | Attending: Cardiovascular Disease | Admitting: Cardiovascular Disease

## 2013-11-07 ENCOUNTER — Encounter (HOSPITAL_COMMUNITY)
Admission: RE | Admit: 2013-11-07 | Discharge: 2013-11-07 | Disposition: A | Discharge status: Home / Self Care | Payer: Medicare Other | Source: Ambulatory Visit | Attending: Cardiovascular Disease | Admitting: Cardiovascular Disease

## 2013-11-10 ENCOUNTER — Encounter (HOSPITAL_COMMUNITY)
Admission: RE | Admit: 2013-11-10 | Discharge: 2013-11-10 | Disposition: A | Discharge status: Home / Self Care | Payer: Self-pay | Source: Ambulatory Visit | Attending: Cardiovascular Disease | Admitting: Cardiovascular Disease

## 2013-11-10 DIAGNOSIS — Z952 Presence of prosthetic heart valve: Secondary | ICD-10-CM | POA: Insufficient documentation

## 2013-11-10 DIAGNOSIS — Z7982 Long term (current) use of aspirin: Secondary | ICD-10-CM | POA: Insufficient documentation

## 2013-11-10 DIAGNOSIS — I4729 Other ventricular tachycardia: Secondary | ICD-10-CM | POA: Insufficient documentation

## 2013-11-10 DIAGNOSIS — I080 Rheumatic disorders of both mitral and aortic valves: Secondary | ICD-10-CM | POA: Insufficient documentation

## 2013-11-10 DIAGNOSIS — Z950 Presence of cardiac pacemaker: Secondary | ICD-10-CM | POA: Insufficient documentation

## 2013-11-10 DIAGNOSIS — Z5189 Encounter for other specified aftercare: Secondary | ICD-10-CM | POA: Insufficient documentation

## 2013-11-10 DIAGNOSIS — I472 Ventricular tachycardia, unspecified: Secondary | ICD-10-CM | POA: Insufficient documentation

## 2013-11-10 DIAGNOSIS — I452 Bifascicular block: Secondary | ICD-10-CM | POA: Insufficient documentation

## 2013-11-10 DIAGNOSIS — E119 Type 2 diabetes mellitus without complications: Secondary | ICD-10-CM | POA: Insufficient documentation

## 2013-11-12 ENCOUNTER — Encounter (HOSPITAL_COMMUNITY)
Admission: RE | Admit: 2013-11-12 | Discharge: 2013-11-12 | Disposition: A | Discharge status: Home / Self Care | Payer: Self-pay | Source: Ambulatory Visit | Attending: Cardiovascular Disease | Admitting: Cardiovascular Disease

## 2013-11-14 ENCOUNTER — Encounter (HOSPITAL_COMMUNITY)
Admission: RE | Admit: 2013-11-14 | Discharge: 2013-11-14 | Disposition: A | Discharge status: Home / Self Care | Payer: Self-pay | Source: Ambulatory Visit | Attending: Cardiovascular Disease | Admitting: Cardiovascular Disease

## 2013-11-17 ENCOUNTER — Encounter (HOSPITAL_COMMUNITY)
Admission: RE | Admit: 2013-11-17 | Discharge: 2013-11-17 | Disposition: A | Discharge status: Home / Self Care | Payer: Self-pay | Source: Ambulatory Visit | Attending: Cardiovascular Disease | Admitting: Cardiovascular Disease

## 2013-11-19 ENCOUNTER — Encounter (HOSPITAL_COMMUNITY)
Admission: RE | Admit: 2013-11-19 | Discharge: 2013-11-19 | Disposition: A | Discharge status: Home / Self Care | Payer: Self-pay | Source: Ambulatory Visit | Attending: Cardiovascular Disease | Admitting: Cardiovascular Disease

## 2013-11-21 ENCOUNTER — Encounter (HOSPITAL_COMMUNITY)
Admission: RE | Admit: 2013-11-21 | Discharge: 2013-11-21 | Disposition: A | Discharge status: Home / Self Care | Payer: Self-pay | Source: Ambulatory Visit | Attending: Cardiovascular Disease | Admitting: Cardiovascular Disease

## 2013-11-24 ENCOUNTER — Encounter (HOSPITAL_COMMUNITY)
Admission: RE | Admit: 2013-11-24 | Discharge: 2013-11-24 | Disposition: A | Discharge status: Home / Self Care | Payer: Self-pay | Source: Ambulatory Visit | Attending: Cardiovascular Disease | Admitting: Cardiovascular Disease

## 2013-11-26 ENCOUNTER — Encounter (HOSPITAL_COMMUNITY)
Admission: RE | Admit: 2013-11-26 | Discharge: 2013-11-26 | Disposition: A | Discharge status: Home / Self Care | Payer: Self-pay | Source: Ambulatory Visit | Attending: Cardiovascular Disease | Admitting: Cardiovascular Disease

## 2013-11-28 ENCOUNTER — Encounter (HOSPITAL_COMMUNITY)
Admission: RE | Admit: 2013-11-28 | Discharge: 2013-11-28 | Disposition: A | Discharge status: Home / Self Care | Payer: Self-pay | Source: Ambulatory Visit | Attending: Cardiovascular Disease | Admitting: Cardiovascular Disease

## 2013-12-01 ENCOUNTER — Encounter (HOSPITAL_COMMUNITY)
Admission: RE | Admit: 2013-12-01 | Discharge: 2013-12-01 | Disposition: A | Discharge status: Home / Self Care | Payer: Self-pay | Source: Ambulatory Visit | Attending: Cardiovascular Disease | Admitting: Cardiovascular Disease

## 2013-12-03 ENCOUNTER — Encounter (HOSPITAL_COMMUNITY)
Admission: RE | Admit: 2013-12-03 | Discharge: 2013-12-03 | Disposition: A | Discharge status: Home / Self Care | Payer: Self-pay | Source: Ambulatory Visit | Attending: Cardiovascular Disease | Admitting: Cardiovascular Disease

## 2013-12-05 ENCOUNTER — Encounter (HOSPITAL_COMMUNITY)
Admission: RE | Admit: 2013-12-05 | Discharge: 2013-12-05 | Disposition: A | Discharge status: Home / Self Care | Payer: Self-pay | Source: Ambulatory Visit | Attending: Cardiovascular Disease | Admitting: Cardiovascular Disease

## 2013-12-08 ENCOUNTER — Encounter (HOSPITAL_COMMUNITY)
Admission: RE | Admit: 2013-12-08 | Discharge: 2013-12-08 | Disposition: A | Discharge status: Home / Self Care | Payer: Self-pay | Source: Ambulatory Visit | Attending: Cardiovascular Disease | Admitting: Cardiovascular Disease

## 2013-12-10 ENCOUNTER — Encounter (HOSPITAL_COMMUNITY)
Admission: RE | Admit: 2013-12-10 | Discharge: 2013-12-10 | Disposition: A | Discharge status: Home / Self Care | Payer: Self-pay | Source: Ambulatory Visit | Attending: Cardiovascular Disease | Admitting: Cardiovascular Disease

## 2013-12-10 DIAGNOSIS — I452 Bifascicular block: Secondary | ICD-10-CM | POA: Insufficient documentation

## 2013-12-10 DIAGNOSIS — Z950 Presence of cardiac pacemaker: Secondary | ICD-10-CM | POA: Insufficient documentation

## 2013-12-10 DIAGNOSIS — Z952 Presence of prosthetic heart valve: Secondary | ICD-10-CM | POA: Insufficient documentation

## 2013-12-10 DIAGNOSIS — I4729 Other ventricular tachycardia: Secondary | ICD-10-CM | POA: Insufficient documentation

## 2013-12-10 DIAGNOSIS — I080 Rheumatic disorders of both mitral and aortic valves: Secondary | ICD-10-CM | POA: Insufficient documentation

## 2013-12-10 DIAGNOSIS — I472 Ventricular tachycardia, unspecified: Secondary | ICD-10-CM | POA: Insufficient documentation

## 2013-12-10 DIAGNOSIS — Z5189 Encounter for other specified aftercare: Secondary | ICD-10-CM | POA: Insufficient documentation

## 2013-12-10 DIAGNOSIS — E119 Type 2 diabetes mellitus without complications: Secondary | ICD-10-CM | POA: Insufficient documentation

## 2013-12-10 DIAGNOSIS — Z7982 Long term (current) use of aspirin: Secondary | ICD-10-CM | POA: Insufficient documentation

## 2013-12-11 ENCOUNTER — Telehealth (HOSPITAL_COMMUNITY): Payer: Self-pay | Admitting: *Deleted

## 2013-12-12 ENCOUNTER — Encounter (HOSPITAL_COMMUNITY)
Admission: RE | Admit: 2013-12-12 | Discharge: 2013-12-12 | Disposition: A | Discharge status: Home / Self Care | Payer: Self-pay | Source: Ambulatory Visit | Attending: Cardiovascular Disease | Admitting: Cardiovascular Disease

## 2013-12-15 ENCOUNTER — Encounter (HOSPITAL_COMMUNITY)
Admission: RE | Admit: 2013-12-15 | Discharge: 2013-12-15 | Disposition: A | Discharge status: Home / Self Care | Payer: Self-pay | Source: Ambulatory Visit | Attending: Cardiovascular Disease | Admitting: Cardiovascular Disease

## 2013-12-17 ENCOUNTER — Encounter (HOSPITAL_COMMUNITY)
Admission: RE | Admit: 2013-12-17 | Discharge: 2013-12-17 | Disposition: A | Discharge status: Home / Self Care | Payer: Self-pay | Source: Ambulatory Visit | Attending: Cardiovascular Disease | Admitting: Cardiovascular Disease

## 2013-12-19 ENCOUNTER — Encounter (HOSPITAL_COMMUNITY)
Admission: RE | Admit: 2013-12-19 | Discharge: 2013-12-19 | Disposition: A | Discharge status: Home / Self Care | Payer: Self-pay | Source: Ambulatory Visit | Attending: Cardiovascular Disease | Admitting: Cardiovascular Disease

## 2013-12-22 ENCOUNTER — Encounter (HOSPITAL_COMMUNITY)
Admission: RE | Admit: 2013-12-22 | Discharge: 2013-12-22 | Disposition: A | Discharge status: Home / Self Care | Payer: Self-pay | Source: Ambulatory Visit | Attending: Cardiovascular Disease | Admitting: Cardiovascular Disease

## 2013-12-23 ENCOUNTER — Ambulatory Visit (HOSPITAL_COMMUNITY)
Admission: RE | Admit: 2013-12-23 | Discharge: 2013-12-23 | Disposition: A | Discharge status: Home / Self Care | Payer: Medicare Other | Source: Ambulatory Visit | Attending: Cardiovascular Disease | Admitting: Cardiovascular Disease

## 2013-12-23 DIAGNOSIS — I351 Nonrheumatic aortic (valve) insufficiency: Secondary | ICD-10-CM

## 2013-12-23 DIAGNOSIS — I359 Nonrheumatic aortic valve disorder, unspecified: Secondary | ICD-10-CM | POA: Insufficient documentation

## 2013-12-23 NOTE — Progress Notes (Signed)
2D Echo Performed 12/23/2013    Tripp Goins, RCS  

## 2013-12-24 ENCOUNTER — Encounter (HOSPITAL_COMMUNITY)
Admission: RE | Admit: 2013-12-24 | Discharge: 2013-12-24 | Disposition: A | Discharge status: Home / Self Care | Payer: Self-pay | Source: Ambulatory Visit | Attending: Cardiovascular Disease | Admitting: Cardiovascular Disease

## 2013-12-26 ENCOUNTER — Encounter (HOSPITAL_COMMUNITY)
Admission: RE | Admit: 2013-12-26 | Discharge: 2013-12-26 | Disposition: A | Discharge status: Home / Self Care | Payer: Self-pay | Source: Ambulatory Visit | Attending: Cardiovascular Disease | Admitting: Cardiovascular Disease

## 2013-12-29 ENCOUNTER — Encounter (HOSPITAL_COMMUNITY)
Admission: RE | Admit: 2013-12-29 | Discharge: 2013-12-29 | Disposition: A | Discharge status: Home / Self Care | Payer: Self-pay | Source: Ambulatory Visit | Attending: Cardiovascular Disease | Admitting: Cardiovascular Disease

## 2013-12-31 ENCOUNTER — Encounter (HOSPITAL_COMMUNITY)
Admission: RE | Admit: 2013-12-31 | Discharge: 2013-12-31 | Disposition: A | Discharge status: Home / Self Care | Payer: Self-pay | Source: Ambulatory Visit | Attending: Cardiovascular Disease | Admitting: Cardiovascular Disease

## 2014-01-02 ENCOUNTER — Encounter (HOSPITAL_COMMUNITY)
Admission: RE | Admit: 2014-01-02 | Discharge: 2014-01-02 | Disposition: A | Discharge status: Home / Self Care | Payer: Self-pay | Source: Ambulatory Visit | Attending: Cardiovascular Disease | Admitting: Cardiovascular Disease

## 2014-01-05 ENCOUNTER — Encounter (HOSPITAL_COMMUNITY)
Admission: RE | Admit: 2014-01-05 | Discharge: 2014-01-05 | Disposition: A | Discharge status: Home / Self Care | Payer: Self-pay | Source: Ambulatory Visit | Attending: Cardiovascular Disease | Admitting: Cardiovascular Disease

## 2014-01-07 ENCOUNTER — Encounter (HOSPITAL_COMMUNITY)
Admission: RE | Admit: 2014-01-07 | Discharge: 2014-01-07 | Disposition: A | Discharge status: Home / Self Care | Payer: Self-pay | Source: Ambulatory Visit | Attending: Cardiovascular Disease | Admitting: Cardiovascular Disease

## 2014-01-09 ENCOUNTER — Encounter (HOSPITAL_COMMUNITY)
Admission: RE | Admit: 2014-01-09 | Discharge: 2014-01-09 | Disposition: A | Discharge status: Home / Self Care | Payer: Self-pay | Source: Ambulatory Visit | Attending: Cardiovascular Disease | Admitting: Cardiovascular Disease

## 2014-01-09 DIAGNOSIS — Z7982 Long term (current) use of aspirin: Secondary | ICD-10-CM | POA: Insufficient documentation

## 2014-01-09 DIAGNOSIS — I4729 Other ventricular tachycardia: Secondary | ICD-10-CM | POA: Insufficient documentation

## 2014-01-09 DIAGNOSIS — E119 Type 2 diabetes mellitus without complications: Secondary | ICD-10-CM | POA: Insufficient documentation

## 2014-01-09 DIAGNOSIS — Z950 Presence of cardiac pacemaker: Secondary | ICD-10-CM | POA: Insufficient documentation

## 2014-01-09 DIAGNOSIS — Z952 Presence of prosthetic heart valve: Secondary | ICD-10-CM | POA: Insufficient documentation

## 2014-01-09 DIAGNOSIS — I452 Bifascicular block: Secondary | ICD-10-CM | POA: Insufficient documentation

## 2014-01-09 DIAGNOSIS — I080 Rheumatic disorders of both mitral and aortic valves: Secondary | ICD-10-CM | POA: Insufficient documentation

## 2014-01-09 DIAGNOSIS — I472 Ventricular tachycardia, unspecified: Secondary | ICD-10-CM | POA: Insufficient documentation

## 2014-01-09 DIAGNOSIS — Z5189 Encounter for other specified aftercare: Secondary | ICD-10-CM | POA: Insufficient documentation

## 2014-01-12 ENCOUNTER — Encounter (HOSPITAL_COMMUNITY)
Admission: RE | Admit: 2014-01-12 | Discharge: 2014-01-12 | Disposition: A | Discharge status: Home / Self Care | Payer: Self-pay | Source: Ambulatory Visit | Attending: Cardiovascular Disease | Admitting: Cardiovascular Disease

## 2014-01-12 ENCOUNTER — Ambulatory Visit (INDEPENDENT_AMBULATORY_CARE_PROVIDER_SITE_OTHER): Payer: Medicare Other | Admitting: *Deleted

## 2014-01-12 ENCOUNTER — Encounter: Payer: Self-pay | Admitting: Cardiovascular Disease

## 2014-01-12 DIAGNOSIS — I441 Atrioventricular block, second degree: Secondary | ICD-10-CM

## 2014-01-14 ENCOUNTER — Encounter (HOSPITAL_COMMUNITY)
Admission: RE | Admit: 2014-01-14 | Discharge: 2014-01-14 | Disposition: A | Discharge status: Home / Self Care | Payer: Self-pay | Source: Ambulatory Visit | Attending: Cardiovascular Disease | Admitting: Cardiovascular Disease

## 2014-01-14 LAB — MDC_IDC_ENUM_SESS_TYPE_REMOTE
Brady Statistic AP VS Percent: 10.11 %
Brady Statistic AS VP Percent: 0.02 %
Brady Statistic AS VS Percent: 89.8 %
Brady Statistic RA Percent Paced: 10.19 %
Date Time Interrogation Session: 20150504134212
Lead Channel Impedance Value: 464 Ohm
Lead Channel Impedance Value: 520 Ohm
Lead Channel Sensing Intrinsic Amplitude: 1.8 mV
Lead Channel Sensing Intrinsic Amplitude: 2.7 mV
Lead Channel Setting Pacing Amplitude: 2 V
Lead Channel Setting Pacing Amplitude: 2.5 V
Lead Channel Setting Pacing Pulse Width: 0.4 ms
Lead Channel Setting Sensing Sensitivity: 0.6 mV
MDC IDC MSMT BATTERY VOLTAGE: 3 V
MDC IDC STAT BRADY AP VP PERCENT: 0.07 %
MDC IDC STAT BRADY RV PERCENT PACED: 0.09 %
Zone Setting Detection Interval: 400 ms
Zone Setting Detection Interval: 400 ms

## 2014-01-16 ENCOUNTER — Encounter (HOSPITAL_COMMUNITY): Payer: Self-pay

## 2014-01-19 ENCOUNTER — Encounter (HOSPITAL_COMMUNITY)
Admission: RE | Admit: 2014-01-19 | Discharge: 2014-01-19 | Disposition: A | Discharge status: Home / Self Care | Payer: Self-pay | Source: Ambulatory Visit | Attending: Cardiovascular Disease | Admitting: Cardiovascular Disease

## 2014-01-21 ENCOUNTER — Encounter (HOSPITAL_COMMUNITY)
Admission: RE | Admit: 2014-01-21 | Discharge: 2014-01-21 | Disposition: A | Discharge status: Home / Self Care | Payer: Self-pay | Source: Ambulatory Visit | Attending: Cardiovascular Disease | Admitting: Cardiovascular Disease

## 2014-01-22 ENCOUNTER — Encounter: Payer: Self-pay | Admitting: Cardiology

## 2014-01-23 ENCOUNTER — Encounter (HOSPITAL_COMMUNITY)
Admission: RE | Admit: 2014-01-23 | Discharge: 2014-01-23 | Disposition: A | Discharge status: Home / Self Care | Payer: Self-pay | Source: Ambulatory Visit | Attending: Cardiovascular Disease | Admitting: Cardiovascular Disease

## 2014-01-26 ENCOUNTER — Encounter (HOSPITAL_COMMUNITY)
Admission: RE | Admit: 2014-01-26 | Discharge: 2014-01-26 | Disposition: A | Discharge status: Home / Self Care | Payer: Self-pay | Source: Ambulatory Visit | Attending: Cardiovascular Disease | Admitting: Cardiovascular Disease

## 2014-01-28 ENCOUNTER — Encounter (HOSPITAL_COMMUNITY)
Admission: RE | Admit: 2014-01-28 | Discharge: 2014-01-28 | Disposition: A | Discharge status: Home / Self Care | Payer: Self-pay | Source: Ambulatory Visit | Attending: Cardiovascular Disease | Admitting: Cardiovascular Disease

## 2014-01-30 ENCOUNTER — Encounter (HOSPITAL_COMMUNITY)
Admission: RE | Admit: 2014-01-30 | Discharge: 2014-01-30 | Disposition: A | Discharge status: Home / Self Care | Payer: Self-pay | Source: Ambulatory Visit | Attending: Cardiovascular Disease | Admitting: Cardiovascular Disease

## 2014-02-04 ENCOUNTER — Encounter (HOSPITAL_COMMUNITY)
Admission: RE | Admit: 2014-02-04 | Discharge: 2014-02-04 | Disposition: A | Discharge status: Home / Self Care | Payer: Self-pay | Source: Ambulatory Visit | Attending: Cardiovascular Disease | Admitting: Cardiovascular Disease

## 2014-02-06 ENCOUNTER — Encounter (HOSPITAL_COMMUNITY)
Admission: RE | Admit: 2014-02-06 | Discharge: 2014-02-06 | Disposition: A | Discharge status: Home / Self Care | Payer: Self-pay | Source: Ambulatory Visit | Attending: Cardiovascular Disease | Admitting: Cardiovascular Disease

## 2014-02-09 ENCOUNTER — Encounter: Payer: Self-pay | Admitting: Cardiovascular Disease

## 2014-02-09 ENCOUNTER — Encounter (HOSPITAL_COMMUNITY)
Admission: RE | Admit: 2014-02-09 | Discharge: 2014-02-09 | Disposition: A | Discharge status: Home / Self Care | Payer: Self-pay | Source: Ambulatory Visit | Attending: Cardiovascular Disease | Admitting: Cardiovascular Disease

## 2014-02-09 DIAGNOSIS — Z950 Presence of cardiac pacemaker: Secondary | ICD-10-CM | POA: Insufficient documentation

## 2014-02-09 DIAGNOSIS — I080 Rheumatic disorders of both mitral and aortic valves: Secondary | ICD-10-CM | POA: Insufficient documentation

## 2014-02-09 DIAGNOSIS — I4729 Other ventricular tachycardia: Secondary | ICD-10-CM | POA: Insufficient documentation

## 2014-02-09 DIAGNOSIS — I472 Ventricular tachycardia, unspecified: Secondary | ICD-10-CM | POA: Insufficient documentation

## 2014-02-09 DIAGNOSIS — Z5189 Encounter for other specified aftercare: Secondary | ICD-10-CM | POA: Insufficient documentation

## 2014-02-09 DIAGNOSIS — Z952 Presence of prosthetic heart valve: Secondary | ICD-10-CM | POA: Insufficient documentation

## 2014-02-09 DIAGNOSIS — Z7982 Long term (current) use of aspirin: Secondary | ICD-10-CM | POA: Insufficient documentation

## 2014-02-09 DIAGNOSIS — I452 Bifascicular block: Secondary | ICD-10-CM | POA: Insufficient documentation

## 2014-02-09 DIAGNOSIS — E119 Type 2 diabetes mellitus without complications: Secondary | ICD-10-CM | POA: Insufficient documentation

## 2014-02-11 ENCOUNTER — Encounter (HOSPITAL_COMMUNITY): Payer: Self-pay

## 2014-02-13 ENCOUNTER — Encounter (HOSPITAL_COMMUNITY)
Admission: RE | Admit: 2014-02-13 | Discharge: 2014-02-13 | Disposition: A | Discharge status: Home / Self Care | Payer: Self-pay | Source: Ambulatory Visit | Attending: Cardiovascular Disease | Admitting: Cardiovascular Disease

## 2014-02-16 ENCOUNTER — Encounter (HOSPITAL_COMMUNITY): Payer: Self-pay

## 2014-02-18 ENCOUNTER — Encounter (HOSPITAL_COMMUNITY)
Admission: RE | Admit: 2014-02-18 | Discharge: 2014-02-18 | Disposition: A | Discharge status: Home / Self Care | Payer: Self-pay | Source: Ambulatory Visit | Attending: Cardiovascular Disease | Admitting: Cardiovascular Disease

## 2014-02-20 ENCOUNTER — Encounter (HOSPITAL_COMMUNITY)
Admission: RE | Admit: 2014-02-20 | Discharge: 2014-02-20 | Disposition: A | Discharge status: Home / Self Care | Payer: Self-pay | Source: Ambulatory Visit | Attending: Cardiovascular Disease | Admitting: Cardiovascular Disease

## 2014-02-23 ENCOUNTER — Encounter (HOSPITAL_COMMUNITY)
Admission: RE | Admit: 2014-02-23 | Discharge: 2014-02-23 | Disposition: A | Discharge status: Home / Self Care | Payer: Self-pay | Source: Ambulatory Visit | Attending: Cardiovascular Disease | Admitting: Cardiovascular Disease

## 2014-02-25 ENCOUNTER — Encounter (HOSPITAL_COMMUNITY)
Admission: RE | Admit: 2014-02-25 | Discharge: 2014-02-25 | Disposition: A | Discharge status: Home / Self Care | Payer: Self-pay | Source: Ambulatory Visit | Attending: Cardiovascular Disease | Admitting: Cardiovascular Disease

## 2014-02-27 ENCOUNTER — Encounter (HOSPITAL_COMMUNITY)
Admission: RE | Admit: 2014-02-27 | Discharge: 2014-02-27 | Disposition: A | Discharge status: Home / Self Care | Payer: Self-pay | Source: Ambulatory Visit | Attending: Cardiovascular Disease | Admitting: Cardiovascular Disease

## 2014-03-02 ENCOUNTER — Encounter (HOSPITAL_COMMUNITY)
Admission: RE | Admit: 2014-03-02 | Discharge: 2014-03-02 | Disposition: A | Discharge status: Home / Self Care | Payer: Self-pay | Source: Ambulatory Visit | Attending: Cardiovascular Disease | Admitting: Cardiovascular Disease

## 2014-03-04 ENCOUNTER — Encounter (HOSPITAL_COMMUNITY)
Admission: RE | Admit: 2014-03-04 | Discharge: 2014-03-04 | Disposition: A | Discharge status: Home / Self Care | Payer: Self-pay | Source: Ambulatory Visit | Attending: Cardiovascular Disease | Admitting: Cardiovascular Disease

## 2014-03-06 ENCOUNTER — Encounter (HOSPITAL_COMMUNITY)
Admission: RE | Admit: 2014-03-06 | Discharge: 2014-03-06 | Disposition: A | Discharge status: Home / Self Care | Payer: Self-pay | Source: Ambulatory Visit | Attending: Cardiovascular Disease | Admitting: Cardiovascular Disease

## 2014-03-09 ENCOUNTER — Encounter (HOSPITAL_COMMUNITY)
Admission: RE | Admit: 2014-03-09 | Discharge: 2014-03-09 | Disposition: A | Discharge status: Home / Self Care | Payer: Medicare Other | Source: Ambulatory Visit | Attending: Cardiovascular Disease | Admitting: Cardiovascular Disease

## 2014-03-11 ENCOUNTER — Encounter (HOSPITAL_COMMUNITY)
Admission: RE | Admit: 2014-03-11 | Discharge: 2014-03-11 | Disposition: A | Discharge status: Home / Self Care | Payer: Self-pay | Source: Ambulatory Visit | Attending: Cardiovascular Disease | Admitting: Cardiovascular Disease

## 2014-03-11 DIAGNOSIS — Z950 Presence of cardiac pacemaker: Secondary | ICD-10-CM | POA: Insufficient documentation

## 2014-03-11 DIAGNOSIS — I472 Ventricular tachycardia, unspecified: Secondary | ICD-10-CM | POA: Insufficient documentation

## 2014-03-11 DIAGNOSIS — Z952 Presence of prosthetic heart valve: Secondary | ICD-10-CM | POA: Insufficient documentation

## 2014-03-11 DIAGNOSIS — Z7982 Long term (current) use of aspirin: Secondary | ICD-10-CM | POA: Insufficient documentation

## 2014-03-11 DIAGNOSIS — I080 Rheumatic disorders of both mitral and aortic valves: Secondary | ICD-10-CM | POA: Insufficient documentation

## 2014-03-11 DIAGNOSIS — I4729 Other ventricular tachycardia: Secondary | ICD-10-CM | POA: Insufficient documentation

## 2014-03-11 DIAGNOSIS — Z5189 Encounter for other specified aftercare: Secondary | ICD-10-CM | POA: Insufficient documentation

## 2014-03-11 DIAGNOSIS — E119 Type 2 diabetes mellitus without complications: Secondary | ICD-10-CM | POA: Insufficient documentation

## 2014-03-11 DIAGNOSIS — I452 Bifascicular block: Secondary | ICD-10-CM | POA: Insufficient documentation

## 2014-03-13 ENCOUNTER — Encounter (HOSPITAL_COMMUNITY): Payer: Self-pay

## 2014-03-16 ENCOUNTER — Encounter (HOSPITAL_COMMUNITY)
Admission: RE | Admit: 2014-03-16 | Discharge: 2014-03-16 | Disposition: A | Discharge status: Home / Self Care | Payer: Self-pay | Source: Ambulatory Visit | Attending: Cardiovascular Disease | Admitting: Cardiovascular Disease

## 2014-03-18 ENCOUNTER — Encounter (HOSPITAL_COMMUNITY)
Admission: RE | Admit: 2014-03-18 | Discharge: 2014-03-18 | Disposition: A | Discharge status: Home / Self Care | Payer: Self-pay | Source: Ambulatory Visit | Attending: Cardiovascular Disease | Admitting: Cardiovascular Disease

## 2014-03-20 ENCOUNTER — Encounter (HOSPITAL_COMMUNITY)
Admission: RE | Admit: 2014-03-20 | Discharge: 2014-03-20 | Disposition: A | Discharge status: Home / Self Care | Payer: Self-pay | Source: Ambulatory Visit | Attending: Cardiovascular Disease | Admitting: Cardiovascular Disease

## 2014-03-23 ENCOUNTER — Encounter (HOSPITAL_COMMUNITY)
Admission: RE | Admit: 2014-03-23 | Discharge: 2014-03-23 | Disposition: A | Discharge status: Home / Self Care | Payer: Self-pay | Source: Ambulatory Visit | Attending: Cardiovascular Disease | Admitting: Cardiovascular Disease

## 2014-03-25 ENCOUNTER — Encounter (HOSPITAL_COMMUNITY)
Admission: RE | Admit: 2014-03-25 | Discharge: 2014-03-25 | Disposition: A | Discharge status: Home / Self Care | Payer: Self-pay | Source: Ambulatory Visit | Attending: Cardiovascular Disease | Admitting: Cardiovascular Disease

## 2014-03-27 ENCOUNTER — Encounter (HOSPITAL_COMMUNITY)
Admission: RE | Admit: 2014-03-27 | Discharge: 2014-03-27 | Disposition: A | Discharge status: Home / Self Care | Payer: Self-pay | Source: Ambulatory Visit | Attending: Cardiovascular Disease | Admitting: Cardiovascular Disease

## 2014-03-30 ENCOUNTER — Encounter (HOSPITAL_COMMUNITY)
Admission: RE | Admit: 2014-03-30 | Discharge: 2014-03-30 | Disposition: A | Discharge status: Home / Self Care | Payer: Self-pay | Source: Ambulatory Visit | Attending: Cardiovascular Disease | Admitting: Cardiovascular Disease

## 2014-04-01 ENCOUNTER — Encounter (HOSPITAL_COMMUNITY)
Admission: RE | Admit: 2014-04-01 | Discharge: 2014-04-01 | Disposition: A | Discharge status: Home / Self Care | Payer: Self-pay | Source: Ambulatory Visit | Attending: Cardiovascular Disease | Admitting: Cardiovascular Disease

## 2014-04-03 ENCOUNTER — Encounter (HOSPITAL_COMMUNITY)
Admission: RE | Admit: 2014-04-03 | Discharge: 2014-04-03 | Disposition: A | Discharge status: Home / Self Care | Payer: Self-pay | Source: Ambulatory Visit | Attending: Cardiovascular Disease | Admitting: Cardiovascular Disease

## 2014-04-06 ENCOUNTER — Encounter (HOSPITAL_COMMUNITY)
Admission: RE | Admit: 2014-04-06 | Discharge: 2014-04-06 | Disposition: A | Discharge status: Home / Self Care | Payer: Self-pay | Source: Ambulatory Visit | Attending: Cardiovascular Disease | Admitting: Cardiovascular Disease

## 2014-04-07 ENCOUNTER — Telehealth: Payer: Self-pay | Admitting: *Deleted

## 2014-04-07 NOTE — Telephone Encounter (Signed)
Maintenance program orders signed and faxed

## 2014-04-08 ENCOUNTER — Encounter (HOSPITAL_COMMUNITY)
Admission: RE | Admit: 2014-04-08 | Discharge: 2014-04-08 | Disposition: A | Discharge status: Home / Self Care | Payer: Self-pay | Source: Ambulatory Visit | Attending: Cardiovascular Disease | Admitting: Cardiovascular Disease

## 2014-04-10 ENCOUNTER — Encounter (HOSPITAL_COMMUNITY)
Admission: RE | Admit: 2014-04-10 | Discharge: 2014-04-10 | Disposition: A | Discharge status: Home / Self Care | Payer: Self-pay | Source: Ambulatory Visit | Attending: Cardiovascular Disease | Admitting: Cardiovascular Disease

## 2014-04-13 ENCOUNTER — Encounter (HOSPITAL_COMMUNITY)
Admission: RE | Admit: 2014-04-13 | Discharge: 2014-04-13 | Disposition: A | Discharge status: Home / Self Care | Payer: Self-pay | Source: Ambulatory Visit | Attending: Cardiovascular Disease | Admitting: Cardiovascular Disease

## 2014-04-13 DIAGNOSIS — Z954 Presence of other heart-valve replacement: Secondary | ICD-10-CM | POA: Insufficient documentation

## 2014-04-13 DIAGNOSIS — I359 Nonrheumatic aortic valve disorder, unspecified: Secondary | ICD-10-CM | POA: Insufficient documentation

## 2014-04-13 DIAGNOSIS — Z5189 Encounter for other specified aftercare: Secondary | ICD-10-CM | POA: Insufficient documentation

## 2014-04-13 DIAGNOSIS — Z950 Presence of cardiac pacemaker: Secondary | ICD-10-CM | POA: Insufficient documentation

## 2014-04-15 ENCOUNTER — Ambulatory Visit (INDEPENDENT_AMBULATORY_CARE_PROVIDER_SITE_OTHER): Payer: Medicare Other | Admitting: *Deleted

## 2014-04-15 ENCOUNTER — Encounter (HOSPITAL_COMMUNITY)
Admission: RE | Admit: 2014-04-15 | Discharge: 2014-04-15 | Disposition: A | Discharge status: Home / Self Care | Payer: Self-pay | Source: Ambulatory Visit | Attending: Cardiovascular Disease | Admitting: Cardiovascular Disease

## 2014-04-15 DIAGNOSIS — I441 Atrioventricular block, second degree: Secondary | ICD-10-CM

## 2014-04-15 NOTE — Progress Notes (Signed)
Remote pacemaker transmission.   

## 2014-04-17 ENCOUNTER — Encounter (HOSPITAL_COMMUNITY)
Admission: RE | Admit: 2014-04-17 | Discharge: 2014-04-17 | Disposition: A | Discharge status: Home / Self Care | Payer: Self-pay | Source: Ambulatory Visit | Attending: Cardiovascular Disease | Admitting: Cardiovascular Disease

## 2014-04-20 ENCOUNTER — Encounter (HOSPITAL_COMMUNITY)
Admission: RE | Admit: 2014-04-20 | Discharge: 2014-04-20 | Disposition: A | Discharge status: Home / Self Care | Payer: Self-pay | Source: Ambulatory Visit | Attending: Cardiovascular Disease | Admitting: Cardiovascular Disease

## 2014-04-20 LAB — MDC_IDC_ENUM_SESS_TYPE_REMOTE
Battery Voltage: 3 V
Brady Statistic AP VP Percent: 0.06 %
Brady Statistic AP VS Percent: 17.4 %
Brady Statistic AS VS Percent: 82.54 %
Brady Statistic RV Percent Paced: 0.06 %
Date Time Interrogation Session: 20150805121802
Lead Channel Impedance Value: 496 Ohm
Lead Channel Sensing Intrinsic Amplitude: 1.7056
Lead Channel Sensing Intrinsic Amplitude: 1.9404
Lead Channel Setting Pacing Amplitude: 2 V
Lead Channel Setting Pacing Pulse Width: 0.4 ms
Lead Channel Setting Sensing Sensitivity: 0.6 mV
MDC IDC MSMT LEADCHNL RV IMPEDANCE VALUE: 448 Ohm
MDC IDC SET LEADCHNL RV PACING AMPLITUDE: 2.5 V
MDC IDC STAT BRADY AS VP PERCENT: 0 %
MDC IDC STAT BRADY RA PERCENT PACED: 17.45 %
Zone Setting Detection Interval: 400 ms
Zone Setting Detection Interval: 400 ms

## 2014-04-22 ENCOUNTER — Encounter (HOSPITAL_COMMUNITY)
Admission: RE | Admit: 2014-04-22 | Discharge: 2014-04-22 | Disposition: A | Discharge status: Home / Self Care | Payer: Self-pay | Source: Ambulatory Visit | Attending: Cardiovascular Disease | Admitting: Cardiovascular Disease

## 2014-04-24 ENCOUNTER — Encounter (HOSPITAL_COMMUNITY)
Admission: RE | Admit: 2014-04-24 | Discharge: 2014-04-24 | Disposition: A | Discharge status: Home / Self Care | Payer: Self-pay | Source: Ambulatory Visit | Attending: Cardiovascular Disease | Admitting: Cardiovascular Disease

## 2014-04-27 ENCOUNTER — Encounter (HOSPITAL_COMMUNITY)
Admission: RE | Admit: 2014-04-27 | Discharge: 2014-04-27 | Disposition: A | Discharge status: Home / Self Care | Payer: Self-pay | Source: Ambulatory Visit | Attending: Cardiovascular Disease | Admitting: Cardiovascular Disease

## 2014-04-29 ENCOUNTER — Encounter (HOSPITAL_COMMUNITY)
Admission: RE | Admit: 2014-04-29 | Discharge: 2014-04-29 | Disposition: A | Discharge status: Home / Self Care | Payer: Self-pay | Source: Ambulatory Visit | Attending: Cardiovascular Disease | Admitting: Cardiovascular Disease

## 2014-05-01 ENCOUNTER — Encounter (HOSPITAL_COMMUNITY)
Admission: RE | Admit: 2014-05-01 | Discharge: 2014-05-01 | Disposition: A | Discharge status: Home / Self Care | Payer: Self-pay | Source: Ambulatory Visit | Attending: Cardiovascular Disease | Admitting: Cardiovascular Disease

## 2014-05-04 ENCOUNTER — Encounter (HOSPITAL_COMMUNITY)
Admission: RE | Admit: 2014-05-04 | Discharge: 2014-05-04 | Disposition: A | Discharge status: Home / Self Care | Payer: Self-pay | Source: Ambulatory Visit | Attending: Cardiovascular Disease | Admitting: Cardiovascular Disease

## 2014-05-05 ENCOUNTER — Encounter: Payer: Self-pay | Admitting: Cardiology

## 2014-05-06 ENCOUNTER — Encounter (HOSPITAL_COMMUNITY)
Admission: RE | Admit: 2014-05-06 | Discharge: 2014-05-06 | Disposition: A | Discharge status: Home / Self Care | Payer: Self-pay | Source: Ambulatory Visit | Attending: Cardiovascular Disease | Admitting: Cardiovascular Disease

## 2014-05-08 ENCOUNTER — Encounter (HOSPITAL_COMMUNITY)
Admission: RE | Admit: 2014-05-08 | Discharge: 2014-05-08 | Disposition: A | Discharge status: Home / Self Care | Payer: Self-pay | Source: Ambulatory Visit | Attending: Cardiovascular Disease | Admitting: Cardiovascular Disease

## 2014-05-11 ENCOUNTER — Encounter (HOSPITAL_COMMUNITY)
Admission: RE | Admit: 2014-05-11 | Discharge: 2014-05-11 | Disposition: A | Discharge status: Home / Self Care | Payer: Self-pay | Source: Ambulatory Visit | Attending: Cardiovascular Disease | Admitting: Cardiovascular Disease

## 2014-05-11 ENCOUNTER — Encounter: Payer: Self-pay | Admitting: Cardiovascular Disease

## 2014-05-13 ENCOUNTER — Encounter (HOSPITAL_COMMUNITY)
Admission: RE | Admit: 2014-05-13 | Discharge: 2014-05-13 | Disposition: A | Discharge status: Home / Self Care | Payer: Self-pay | Source: Ambulatory Visit | Attending: Cardiovascular Disease | Admitting: Cardiovascular Disease

## 2014-05-13 DIAGNOSIS — I359 Nonrheumatic aortic valve disorder, unspecified: Secondary | ICD-10-CM | POA: Insufficient documentation

## 2014-05-13 DIAGNOSIS — Z5189 Encounter for other specified aftercare: Secondary | ICD-10-CM | POA: Insufficient documentation

## 2014-05-13 DIAGNOSIS — Z950 Presence of cardiac pacemaker: Secondary | ICD-10-CM | POA: Insufficient documentation

## 2014-05-13 DIAGNOSIS — Z954 Presence of other heart-valve replacement: Secondary | ICD-10-CM | POA: Insufficient documentation

## 2014-05-15 ENCOUNTER — Encounter (HOSPITAL_COMMUNITY)
Admission: RE | Admit: 2014-05-15 | Discharge: 2014-05-15 | Disposition: A | Discharge status: Home / Self Care | Payer: Self-pay | Source: Ambulatory Visit | Attending: Cardiovascular Disease | Admitting: Cardiovascular Disease

## 2014-05-20 ENCOUNTER — Encounter (HOSPITAL_COMMUNITY)
Admission: RE | Admit: 2014-05-20 | Discharge: 2014-05-20 | Disposition: A | Discharge status: Home / Self Care | Payer: Self-pay | Source: Ambulatory Visit | Attending: Cardiovascular Disease | Admitting: Cardiovascular Disease

## 2014-05-22 ENCOUNTER — Encounter (HOSPITAL_COMMUNITY)
Admission: RE | Admit: 2014-05-22 | Discharge: 2014-05-22 | Disposition: A | Discharge status: Home / Self Care | Payer: Self-pay | Source: Ambulatory Visit | Attending: Cardiovascular Disease | Admitting: Cardiovascular Disease

## 2014-05-25 ENCOUNTER — Encounter (HOSPITAL_COMMUNITY)
Admission: RE | Admit: 2014-05-25 | Discharge: 2014-05-25 | Disposition: A | Discharge status: Home / Self Care | Payer: Self-pay | Source: Ambulatory Visit | Attending: Cardiovascular Disease | Admitting: Cardiovascular Disease

## 2014-05-27 ENCOUNTER — Encounter (HOSPITAL_COMMUNITY)
Admission: RE | Admit: 2014-05-27 | Discharge: 2014-05-27 | Disposition: A | Discharge status: Home / Self Care | Payer: Self-pay | Source: Ambulatory Visit | Attending: Cardiovascular Disease | Admitting: Cardiovascular Disease

## 2014-05-29 ENCOUNTER — Encounter (HOSPITAL_COMMUNITY)
Admission: RE | Admit: 2014-05-29 | Discharge: 2014-05-29 | Disposition: A | Discharge status: Home / Self Care | Payer: Self-pay | Source: Ambulatory Visit | Attending: Cardiovascular Disease | Admitting: Cardiovascular Disease

## 2014-06-01 ENCOUNTER — Encounter (HOSPITAL_COMMUNITY)
Admission: RE | Admit: 2014-06-01 | Discharge: 2014-06-01 | Disposition: A | Discharge status: Home / Self Care | Payer: Self-pay | Source: Ambulatory Visit | Attending: Cardiovascular Disease | Admitting: Cardiovascular Disease

## 2014-06-03 ENCOUNTER — Encounter (HOSPITAL_COMMUNITY)
Admission: RE | Admit: 2014-06-03 | Discharge: 2014-06-03 | Disposition: A | Discharge status: Home / Self Care | Payer: Self-pay | Source: Ambulatory Visit | Attending: Cardiovascular Disease | Admitting: Cardiovascular Disease

## 2014-06-05 ENCOUNTER — Encounter (HOSPITAL_COMMUNITY)
Admission: RE | Admit: 2014-06-05 | Discharge: 2014-06-05 | Disposition: A | Discharge status: Home / Self Care | Payer: Self-pay | Source: Ambulatory Visit | Attending: Cardiovascular Disease | Admitting: Cardiovascular Disease

## 2014-06-08 ENCOUNTER — Encounter (HOSPITAL_COMMUNITY)
Admission: RE | Admit: 2014-06-08 | Discharge: 2014-06-08 | Disposition: A | Discharge status: Home / Self Care | Payer: Self-pay | Source: Ambulatory Visit | Attending: Cardiovascular Disease | Admitting: Cardiovascular Disease

## 2014-06-10 ENCOUNTER — Encounter (HOSPITAL_COMMUNITY)
Admission: RE | Admit: 2014-06-10 | Discharge: 2014-06-10 | Disposition: A | Discharge status: Home / Self Care | Payer: Self-pay | Source: Ambulatory Visit | Attending: Cardiovascular Disease | Admitting: Cardiovascular Disease

## 2014-06-12 ENCOUNTER — Encounter (HOSPITAL_COMMUNITY)
Admission: RE | Admit: 2014-06-12 | Discharge: 2014-06-12 | Disposition: A | Discharge status: Home / Self Care | Payer: Self-pay | Source: Ambulatory Visit | Attending: Cardiovascular Disease | Admitting: Cardiovascular Disease

## 2014-06-12 DIAGNOSIS — Z950 Presence of cardiac pacemaker: Secondary | ICD-10-CM | POA: Insufficient documentation

## 2014-06-12 DIAGNOSIS — I359 Nonrheumatic aortic valve disorder, unspecified: Secondary | ICD-10-CM | POA: Insufficient documentation

## 2014-06-12 DIAGNOSIS — Z952 Presence of prosthetic heart valve: Secondary | ICD-10-CM | POA: Insufficient documentation

## 2014-06-15 ENCOUNTER — Encounter (HOSPITAL_COMMUNITY)
Admission: RE | Admit: 2014-06-15 | Discharge: 2014-06-15 | Disposition: A | Discharge status: Home / Self Care | Payer: Self-pay | Source: Ambulatory Visit | Attending: Cardiovascular Disease | Admitting: Cardiovascular Disease

## 2014-06-17 ENCOUNTER — Encounter (HOSPITAL_COMMUNITY)
Admission: RE | Admit: 2014-06-17 | Discharge: 2014-06-17 | Disposition: A | Discharge status: Home / Self Care | Payer: Self-pay | Source: Ambulatory Visit | Attending: Cardiovascular Disease | Admitting: Cardiovascular Disease

## 2014-06-19 ENCOUNTER — Encounter (HOSPITAL_COMMUNITY)
Admission: RE | Admit: 2014-06-19 | Discharge: 2014-06-19 | Disposition: A | Discharge status: Home / Self Care | Payer: Self-pay | Source: Ambulatory Visit | Attending: Cardiovascular Disease | Admitting: Cardiovascular Disease

## 2014-06-22 ENCOUNTER — Encounter (HOSPITAL_COMMUNITY)
Admission: RE | Admit: 2014-06-22 | Discharge: 2014-06-22 | Disposition: A | Discharge status: Home / Self Care | Payer: Self-pay | Source: Ambulatory Visit | Attending: Cardiovascular Disease | Admitting: Cardiovascular Disease

## 2014-06-24 ENCOUNTER — Encounter (HOSPITAL_COMMUNITY)
Admission: RE | Admit: 2014-06-24 | Discharge: 2014-06-24 | Disposition: A | Discharge status: Home / Self Care | Payer: Self-pay | Source: Ambulatory Visit | Attending: Cardiovascular Disease | Admitting: Cardiovascular Disease

## 2014-06-26 ENCOUNTER — Encounter (HOSPITAL_COMMUNITY)
Admission: RE | Admit: 2014-06-26 | Discharge: 2014-06-26 | Disposition: A | Discharge status: Home / Self Care | Payer: Self-pay | Source: Ambulatory Visit | Attending: Cardiovascular Disease | Admitting: Cardiovascular Disease

## 2014-06-29 ENCOUNTER — Encounter (HOSPITAL_COMMUNITY)
Admission: RE | Admit: 2014-06-29 | Discharge: 2014-06-29 | Disposition: A | Discharge status: Home / Self Care | Payer: Self-pay | Source: Ambulatory Visit | Attending: Cardiovascular Disease | Admitting: Cardiovascular Disease

## 2014-07-01 ENCOUNTER — Encounter (HOSPITAL_COMMUNITY)
Admission: RE | Admit: 2014-07-01 | Discharge: 2014-07-01 | Disposition: A | Discharge status: Home / Self Care | Payer: Self-pay | Source: Ambulatory Visit | Attending: Cardiovascular Disease | Admitting: Cardiovascular Disease

## 2014-07-03 ENCOUNTER — Encounter (HOSPITAL_COMMUNITY)
Admission: RE | Admit: 2014-07-03 | Discharge: 2014-07-03 | Disposition: A | Discharge status: Home / Self Care | Payer: Self-pay | Source: Ambulatory Visit | Attending: Cardiovascular Disease | Admitting: Cardiovascular Disease

## 2014-07-06 ENCOUNTER — Encounter (HOSPITAL_COMMUNITY)
Admission: RE | Admit: 2014-07-06 | Discharge: 2014-07-06 | Disposition: A | Discharge status: Home / Self Care | Payer: Self-pay | Source: Ambulatory Visit | Attending: Cardiovascular Disease | Admitting: Cardiovascular Disease

## 2014-07-08 ENCOUNTER — Encounter (HOSPITAL_COMMUNITY)
Admission: RE | Admit: 2014-07-08 | Discharge: 2014-07-08 | Disposition: A | Discharge status: Home / Self Care | Payer: Self-pay | Source: Ambulatory Visit | Attending: Cardiovascular Disease | Admitting: Cardiovascular Disease

## 2014-07-10 ENCOUNTER — Encounter (HOSPITAL_COMMUNITY)
Admission: RE | Admit: 2014-07-10 | Discharge: 2014-07-10 | Disposition: A | Discharge status: Home / Self Care | Payer: Self-pay | Source: Ambulatory Visit | Attending: Cardiovascular Disease | Admitting: Cardiovascular Disease

## 2014-07-13 ENCOUNTER — Encounter (HOSPITAL_COMMUNITY)
Admission: RE | Admit: 2014-07-13 | Discharge: 2014-07-13 | Disposition: A | Discharge status: Home / Self Care | Payer: Self-pay | Source: Ambulatory Visit | Attending: Cardiovascular Disease | Admitting: Cardiovascular Disease

## 2014-07-13 DIAGNOSIS — Z950 Presence of cardiac pacemaker: Secondary | ICD-10-CM | POA: Insufficient documentation

## 2014-07-13 DIAGNOSIS — Z952 Presence of prosthetic heart valve: Secondary | ICD-10-CM | POA: Insufficient documentation

## 2014-07-13 DIAGNOSIS — I359 Nonrheumatic aortic valve disorder, unspecified: Secondary | ICD-10-CM | POA: Insufficient documentation

## 2014-07-15 ENCOUNTER — Encounter (HOSPITAL_COMMUNITY): Payer: Self-pay

## 2014-07-16 ENCOUNTER — Ambulatory Visit (INDEPENDENT_AMBULATORY_CARE_PROVIDER_SITE_OTHER): Payer: Medicare Other | Admitting: *Deleted

## 2014-07-16 DIAGNOSIS — I441 Atrioventricular block, second degree: Secondary | ICD-10-CM

## 2014-07-16 LAB — MDC_IDC_ENUM_SESS_TYPE_REMOTE
Battery Voltage: 3 V
Brady Statistic AP VP Percent: 0.1 %
Brady Statistic AS VS Percent: 82.4 %
Lead Channel Impedance Value: 464 Ohm
Lead Channel Impedance Value: 472 Ohm
Lead Channel Sensing Intrinsic Amplitude: 1.8 mV
Lead Channel Sensing Intrinsic Amplitude: 2.7 mV
Lead Channel Setting Pacing Amplitude: 2 V
Lead Channel Setting Pacing Pulse Width: 0.4 ms
MDC IDC SET LEADCHNL RV PACING AMPLITUDE: 2.5 V
MDC IDC SET LEADCHNL RV SENSING SENSITIVITY: 0.6 mV
MDC IDC SET ZONE DETECTION INTERVAL: 400 ms
MDC IDC STAT BRADY AP VS PERCENT: 17.4 %
MDC IDC STAT BRADY AS VP PERCENT: 0.1 %
Zone Setting Detection Interval: 400 ms

## 2014-07-16 NOTE — Progress Notes (Signed)
Remote pacemaker transmission.   

## 2014-07-17 ENCOUNTER — Encounter (HOSPITAL_COMMUNITY)
Admission: RE | Admit: 2014-07-17 | Discharge: 2014-07-17 | Disposition: A | Discharge status: Home / Self Care | Payer: Self-pay | Source: Ambulatory Visit | Attending: Cardiovascular Disease | Admitting: Cardiovascular Disease

## 2014-07-20 ENCOUNTER — Encounter (HOSPITAL_COMMUNITY)
Admission: RE | Admit: 2014-07-20 | Discharge: 2014-07-20 | Disposition: A | Discharge status: Home / Self Care | Payer: Self-pay | Source: Ambulatory Visit | Attending: Cardiovascular Disease | Admitting: Cardiovascular Disease

## 2014-07-22 ENCOUNTER — Encounter (HOSPITAL_COMMUNITY)
Admission: RE | Admit: 2014-07-22 | Discharge: 2014-07-22 | Disposition: A | Discharge status: Home / Self Care | Payer: Self-pay | Source: Ambulatory Visit | Attending: Cardiovascular Disease | Admitting: Cardiovascular Disease

## 2014-07-24 ENCOUNTER — Encounter (HOSPITAL_COMMUNITY)
Admission: RE | Admit: 2014-07-24 | Discharge: 2014-07-24 | Disposition: A | Discharge status: Home / Self Care | Payer: Self-pay | Source: Ambulatory Visit | Attending: Cardiovascular Disease | Admitting: Cardiovascular Disease

## 2014-07-27 ENCOUNTER — Encounter (HOSPITAL_COMMUNITY)
Admission: RE | Admit: 2014-07-27 | Discharge: 2014-07-27 | Disposition: A | Discharge status: Home / Self Care | Payer: Self-pay | Source: Ambulatory Visit | Attending: Cardiovascular Disease | Admitting: Cardiovascular Disease

## 2014-07-29 ENCOUNTER — Encounter (HOSPITAL_COMMUNITY)
Admission: RE | Admit: 2014-07-29 | Discharge: 2014-07-29 | Disposition: A | Discharge status: Home / Self Care | Payer: Medicare Other | Source: Ambulatory Visit | Attending: Cardiovascular Disease | Admitting: Cardiovascular Disease

## 2014-07-31 ENCOUNTER — Encounter: Payer: Self-pay | Admitting: Cardiology

## 2014-07-31 ENCOUNTER — Encounter (HOSPITAL_COMMUNITY)
Admission: RE | Admit: 2014-07-31 | Discharge: 2014-07-31 | Disposition: A | Discharge status: Home / Self Care | Payer: Self-pay | Source: Ambulatory Visit | Attending: Cardiovascular Disease | Admitting: Cardiovascular Disease

## 2014-08-03 ENCOUNTER — Other Ambulatory Visit: Payer: Self-pay | Admitting: Cardiovascular Disease

## 2014-08-03 ENCOUNTER — Encounter (HOSPITAL_COMMUNITY)
Admission: RE | Admit: 2014-08-03 | Discharge: 2014-08-03 | Disposition: A | Discharge status: Home / Self Care | Payer: Self-pay | Source: Ambulatory Visit | Attending: Cardiovascular Disease | Admitting: Cardiovascular Disease

## 2014-08-03 NOTE — Telephone Encounter (Signed)
Rx was sent to pharmacy electronically. 

## 2014-08-05 ENCOUNTER — Encounter: Payer: Self-pay | Admitting: Cardiovascular Disease

## 2014-08-05 ENCOUNTER — Encounter (HOSPITAL_COMMUNITY)
Admission: RE | Admit: 2014-08-05 | Discharge: 2014-08-05 | Disposition: A | Discharge status: Home / Self Care | Payer: Medicare Other | Source: Ambulatory Visit | Attending: Cardiovascular Disease | Admitting: Cardiovascular Disease

## 2014-08-10 ENCOUNTER — Encounter (HOSPITAL_COMMUNITY)
Admission: RE | Admit: 2014-08-10 | Discharge: 2014-08-10 | Disposition: A | Discharge status: Home / Self Care | Payer: Self-pay | Source: Ambulatory Visit | Attending: Cardiovascular Disease | Admitting: Cardiovascular Disease

## 2014-08-12 ENCOUNTER — Encounter (HOSPITAL_COMMUNITY)
Admission: RE | Admit: 2014-08-12 | Discharge: 2014-08-12 | Disposition: A | Discharge status: Home / Self Care | Payer: Self-pay | Source: Ambulatory Visit | Attending: Cardiovascular Disease | Admitting: Cardiovascular Disease

## 2014-08-12 DIAGNOSIS — Z952 Presence of prosthetic heart valve: Secondary | ICD-10-CM | POA: Insufficient documentation

## 2014-08-12 DIAGNOSIS — Z950 Presence of cardiac pacemaker: Secondary | ICD-10-CM | POA: Insufficient documentation

## 2014-08-12 DIAGNOSIS — I359 Nonrheumatic aortic valve disorder, unspecified: Secondary | ICD-10-CM | POA: Insufficient documentation

## 2014-08-13 ENCOUNTER — Telehealth: Payer: Self-pay | Admitting: *Deleted

## 2014-08-13 NOTE — Telephone Encounter (Signed)
New Message  Left vm for pt concerning flu shot 

## 2014-08-14 ENCOUNTER — Encounter (HOSPITAL_COMMUNITY)
Admission: RE | Admit: 2014-08-14 | Discharge: 2014-08-14 | Disposition: A | Discharge status: Home / Self Care | Payer: Self-pay | Source: Ambulatory Visit | Attending: Cardiovascular Disease | Admitting: Cardiovascular Disease

## 2014-08-17 ENCOUNTER — Encounter (HOSPITAL_COMMUNITY)
Admission: RE | Admit: 2014-08-17 | Discharge: 2014-08-17 | Disposition: A | Discharge status: Home / Self Care | Payer: Self-pay | Source: Ambulatory Visit | Attending: Cardiovascular Disease | Admitting: Cardiovascular Disease

## 2014-08-19 ENCOUNTER — Encounter (HOSPITAL_COMMUNITY)
Admission: RE | Admit: 2014-08-19 | Discharge: 2014-08-19 | Disposition: A | Discharge status: Home / Self Care | Payer: Self-pay | Source: Ambulatory Visit | Attending: Cardiovascular Disease | Admitting: Cardiovascular Disease

## 2014-08-21 ENCOUNTER — Encounter (HOSPITAL_COMMUNITY)
Admission: RE | Admit: 2014-08-21 | Discharge: 2014-08-21 | Disposition: A | Discharge status: Home / Self Care | Payer: Self-pay | Source: Ambulatory Visit | Attending: Cardiovascular Disease | Admitting: Cardiovascular Disease

## 2014-08-24 ENCOUNTER — Encounter (HOSPITAL_COMMUNITY)
Admission: RE | Admit: 2014-08-24 | Discharge: 2014-08-24 | Disposition: A | Discharge status: Home / Self Care | Payer: Self-pay | Source: Ambulatory Visit | Attending: Cardiovascular Disease | Admitting: Cardiovascular Disease

## 2014-08-26 ENCOUNTER — Encounter (HOSPITAL_COMMUNITY)
Admission: RE | Admit: 2014-08-26 | Discharge: 2014-08-26 | Disposition: A | Discharge status: Home / Self Care | Payer: Self-pay | Source: Ambulatory Visit | Attending: Cardiovascular Disease | Admitting: Cardiovascular Disease

## 2014-08-28 ENCOUNTER — Encounter (HOSPITAL_COMMUNITY)
Admission: RE | Admit: 2014-08-28 | Discharge: 2014-08-28 | Disposition: A | Discharge status: Home / Self Care | Payer: Self-pay | Source: Ambulatory Visit | Attending: Cardiovascular Disease | Admitting: Cardiovascular Disease

## 2014-08-31 ENCOUNTER — Encounter (HOSPITAL_COMMUNITY)
Admission: RE | Admit: 2014-08-31 | Discharge: 2014-08-31 | Disposition: A | Discharge status: Home / Self Care | Payer: Self-pay | Source: Ambulatory Visit | Attending: Cardiovascular Disease | Admitting: Cardiovascular Disease

## 2014-09-02 ENCOUNTER — Encounter (HOSPITAL_COMMUNITY)
Admission: RE | Admit: 2014-09-02 | Discharge: 2014-09-02 | Disposition: A | Discharge status: Home / Self Care | Payer: Self-pay | Source: Ambulatory Visit | Attending: Cardiovascular Disease | Admitting: Cardiovascular Disease

## 2014-09-07 ENCOUNTER — Encounter (HOSPITAL_COMMUNITY)
Admission: RE | Admit: 2014-09-07 | Discharge: 2014-09-07 | Disposition: A | Discharge status: Home / Self Care | Payer: Self-pay | Source: Ambulatory Visit | Attending: Cardiovascular Disease | Admitting: Cardiovascular Disease

## 2014-09-09 ENCOUNTER — Encounter (HOSPITAL_COMMUNITY)
Admission: RE | Admit: 2014-09-09 | Discharge: 2014-09-09 | Disposition: A | Discharge status: Home / Self Care | Payer: Self-pay | Source: Ambulatory Visit | Attending: Cardiovascular Disease | Admitting: Cardiovascular Disease

## 2014-09-14 ENCOUNTER — Encounter (HOSPITAL_COMMUNITY)
Admission: RE | Admit: 2014-09-14 | Discharge: 2014-09-14 | Disposition: A | Discharge status: Home / Self Care | Payer: Self-pay | Source: Ambulatory Visit | Attending: Cardiovascular Disease | Admitting: Cardiovascular Disease

## 2014-09-14 DIAGNOSIS — Z952 Presence of prosthetic heart valve: Secondary | ICD-10-CM | POA: Insufficient documentation

## 2014-09-14 DIAGNOSIS — Z950 Presence of cardiac pacemaker: Secondary | ICD-10-CM | POA: Insufficient documentation

## 2014-09-14 DIAGNOSIS — I359 Nonrheumatic aortic valve disorder, unspecified: Secondary | ICD-10-CM | POA: Insufficient documentation

## 2014-09-16 ENCOUNTER — Encounter (HOSPITAL_COMMUNITY)
Admission: RE | Admit: 2014-09-16 | Discharge: 2014-09-16 | Disposition: A | Discharge status: Home / Self Care | Payer: Self-pay | Source: Ambulatory Visit | Attending: Cardiovascular Disease | Admitting: Cardiovascular Disease

## 2014-09-18 ENCOUNTER — Encounter (HOSPITAL_COMMUNITY)
Admission: RE | Admit: 2014-09-18 | Discharge: 2014-09-18 | Disposition: A | Discharge status: Home / Self Care | Payer: Self-pay | Source: Ambulatory Visit | Attending: Cardiovascular Disease | Admitting: Cardiovascular Disease

## 2014-09-21 ENCOUNTER — Encounter (HOSPITAL_COMMUNITY)
Admission: RE | Admit: 2014-09-21 | Discharge: 2014-09-21 | Disposition: A | Discharge status: Home / Self Care | Payer: Self-pay | Source: Ambulatory Visit | Attending: Cardiovascular Disease | Admitting: Cardiovascular Disease

## 2014-09-23 ENCOUNTER — Encounter (HOSPITAL_COMMUNITY)
Admission: RE | Admit: 2014-09-23 | Discharge: 2014-09-23 | Disposition: A | Discharge status: Home / Self Care | Payer: Self-pay | Source: Ambulatory Visit | Attending: Cardiovascular Disease | Admitting: Cardiovascular Disease

## 2014-09-25 ENCOUNTER — Encounter (HOSPITAL_COMMUNITY): Payer: Self-pay

## 2014-09-28 ENCOUNTER — Encounter (HOSPITAL_COMMUNITY): Payer: Self-pay

## 2014-09-30 ENCOUNTER — Encounter (HOSPITAL_COMMUNITY): Payer: Self-pay

## 2014-10-02 ENCOUNTER — Encounter (HOSPITAL_COMMUNITY): Payer: Self-pay

## 2014-10-05 ENCOUNTER — Encounter (HOSPITAL_COMMUNITY)
Admission: RE | Admit: 2014-10-05 | Discharge: 2014-10-05 | Disposition: A | Discharge status: Home / Self Care | Payer: Self-pay | Source: Ambulatory Visit | Attending: Cardiovascular Disease | Admitting: Cardiovascular Disease

## 2014-10-07 ENCOUNTER — Encounter (HOSPITAL_COMMUNITY)
Admission: RE | Admit: 2014-10-07 | Discharge: 2014-10-07 | Disposition: A | Discharge status: Home / Self Care | Payer: Self-pay | Source: Ambulatory Visit | Attending: Cardiovascular Disease | Admitting: Cardiovascular Disease

## 2014-10-08 ENCOUNTER — Telehealth: Payer: Self-pay | Admitting: *Deleted

## 2014-10-08 NOTE — Telephone Encounter (Signed)
Signed order to continue cardiac rehab faxed to [facility].

## 2014-10-09 ENCOUNTER — Encounter (HOSPITAL_COMMUNITY)
Admission: RE | Admit: 2014-10-09 | Discharge: 2014-10-09 | Disposition: A | Discharge status: Home / Self Care | Payer: Self-pay | Source: Ambulatory Visit | Attending: Cardiovascular Disease | Admitting: Cardiovascular Disease

## 2014-10-12 ENCOUNTER — Encounter (HOSPITAL_COMMUNITY)
Admission: RE | Admit: 2014-10-12 | Discharge: 2014-10-12 | Disposition: A | Discharge status: Home / Self Care | Payer: Self-pay | Source: Ambulatory Visit | Attending: Cardiovascular Disease | Admitting: Cardiovascular Disease

## 2014-10-12 DIAGNOSIS — Z950 Presence of cardiac pacemaker: Secondary | ICD-10-CM | POA: Insufficient documentation

## 2014-10-12 DIAGNOSIS — I359 Nonrheumatic aortic valve disorder, unspecified: Secondary | ICD-10-CM | POA: Insufficient documentation

## 2014-10-12 DIAGNOSIS — Z952 Presence of prosthetic heart valve: Secondary | ICD-10-CM | POA: Insufficient documentation

## 2014-10-13 ENCOUNTER — Encounter: Payer: Self-pay | Admitting: Cardiovascular Disease

## 2014-10-13 ENCOUNTER — Ambulatory Visit (INDEPENDENT_AMBULATORY_CARE_PROVIDER_SITE_OTHER): Payer: Medicare Other | Admitting: Cardiovascular Disease

## 2014-10-13 VITALS — BP 152/80 | HR 77 | Ht 71.0 in | Wt 213.1 lb

## 2014-10-13 DIAGNOSIS — I441 Atrioventricular block, second degree: Secondary | ICD-10-CM

## 2014-10-13 DIAGNOSIS — I10 Essential (primary) hypertension: Secondary | ICD-10-CM

## 2014-10-13 DIAGNOSIS — I351 Nonrheumatic aortic (valve) insufficiency: Secondary | ICD-10-CM

## 2014-10-13 DIAGNOSIS — E785 Hyperlipidemia, unspecified: Secondary | ICD-10-CM

## 2014-10-13 DIAGNOSIS — I4891 Unspecified atrial fibrillation: Secondary | ICD-10-CM

## 2014-10-13 DIAGNOSIS — Z950 Presence of cardiac pacemaker: Secondary | ICD-10-CM

## 2014-10-13 DIAGNOSIS — Z954 Presence of other heart-valve replacement: Secondary | ICD-10-CM

## 2014-10-13 DIAGNOSIS — Z952 Presence of prosthetic heart valve: Secondary | ICD-10-CM

## 2014-10-13 DIAGNOSIS — I9789 Other postprocedural complications and disorders of the circulatory system, not elsewhere classified: Secondary | ICD-10-CM

## 2014-10-13 DIAGNOSIS — Z79899 Other long term (current) drug therapy: Secondary | ICD-10-CM

## 2014-10-13 LAB — MDC_IDC_ENUM_SESS_TYPE_INCLINIC
Battery Voltage: 3 V
Brady Statistic AP VS Percent: 13.4 %
Brady Statistic AS VP Percent: 0.1 % — CL
Lead Channel Impedance Value: 384 Ohm
Lead Channel Pacing Threshold Amplitude: 1 V
Lead Channel Pacing Threshold Pulse Width: 0.4 ms
Lead Channel Sensing Intrinsic Amplitude: 2.1 mV
Lead Channel Sensing Intrinsic Amplitude: 4.1 mV
Lead Channel Setting Pacing Amplitude: 2 V
Lead Channel Setting Pacing Amplitude: 2.5 V
Lead Channel Setting Pacing Pulse Width: 0.4 ms
MDC IDC MSMT LEADCHNL RA PACING THRESHOLD AMPLITUDE: 1 V
MDC IDC MSMT LEADCHNL RA PACING THRESHOLD PULSEWIDTH: 0.8 ms
MDC IDC MSMT LEADCHNL RV IMPEDANCE VALUE: 448 Ohm
MDC IDC SET LEADCHNL RV SENSING SENSITIVITY: 0.6 mV
MDC IDC STAT BRADY AP VP PERCENT: 0.1 % — AB
MDC IDC STAT BRADY AS VS PERCENT: 86.5 %
Zone Setting Detection Interval: 400 ms
Zone Setting Detection Interval: 400 ms

## 2014-10-13 LAB — COMPREHENSIVE METABOLIC PANEL
ALT: 20 U/L (ref 0–53)
AST: 21 U/L (ref 0–37)
Albumin: 4 g/dL (ref 3.5–5.2)
Alkaline Phosphatase: 56 U/L (ref 39–117)
BUN: 14 mg/dL (ref 6–23)
CHLORIDE: 106 meq/L (ref 96–112)
CO2: 24 mEq/L (ref 19–32)
Calcium: 9.2 mg/dL (ref 8.4–10.5)
Creat: 0.88 mg/dL (ref 0.50–1.35)
GLUCOSE: 101 mg/dL — AB (ref 70–99)
Potassium: 4.1 mEq/L (ref 3.5–5.3)
SODIUM: 138 meq/L (ref 135–145)
Total Bilirubin: 0.9 mg/dL (ref 0.2–1.2)
Total Protein: 7.1 g/dL (ref 6.0–8.3)

## 2014-10-13 LAB — LIPID PANEL
Cholesterol: 119 mg/dL (ref 0–200)
HDL: 30 mg/dL — ABNORMAL LOW (ref >39)
LDL Cholesterol: 67 mg/dL (ref 0–99)
Total CHOL/HDL Ratio: 4 Ratio
Triglycerides: 109 mg/dL (ref <150)
VLDL: 22 mg/dL (ref 0–40)

## 2014-10-13 LAB — HEMOGLOBIN A1C
Hgb A1c MFr Bld: 6 % — ABNORMAL HIGH (ref <5.7)
MEAN PLASMA GLUCOSE: 126 mg/dL — AB (ref <117)

## 2014-10-13 NOTE — Patient Instructions (Addendum)
Remote monitoring is used to monitor your pacemaker home. This monitoring reduces the number of office visits required to check your device to one time per year. It allows [place] to keep an eye on the functioning of your device to ensure it is working properly. You are scheduled for a device check from home on 01-12-2015. You may send your transmission at any time that day. If you have a wireless device, the transmission will be sent automatically. After your physician reviews your transmission, you will receive a postcard with your next transmission date.  Your physician recommends that you schedule a follow-up appointment in: 12 months with Dr.Croitoru  Your physician has requested that you have an echocardiogram. Echocardiography is a painless test that uses sound waves to create images of your heart. It provides your doctor with information about the size and shape of your heart and how well your heart's chambers and valves are working. This procedure takes approximately one hour. There are no restrictions for this procedure.  Your physician recommends that you return for lab work in: Fasting lipids, CMP, A1C

## 2014-10-13 NOTE — Progress Notes (Signed)
Patient ID: [name] [name], male   DOB: [birth year], 69 y.o.   MRN: [id number]      Reason for office visit S/P AVR, second degree AV block s/p PPM, HTN, hyperlipidemia  [name] [name] done well since his last appointment.. He has not had any new health challenges. He has lost weight and has a BMI of 30. Diabetes control remains excellent, as does his lipid profile. He has not had issues with shortness of breath, chest pain, palpitations, syncope or lower extremity edema. He is still enrolled in Cardiac Rehab Phase IV which he finds enjoyable, educational and useful. BP a little high today (often is in the office), but at rehab it is consistently 120-130/60-65 mm Hg.  He underwent aortic valve replacement with a 21 mm biological prosthesis in January of 2012 for symptomatic aortic stenosis. He has developed mild to moderate aortic insufficiency early on in this prosthesis. He has an audible murmur. Serial echocardiograms do not appear to show that this is a progressive disorder.  He had atrial fibrillation in the immediate postoperative period. His pacemaker has not shown any evidence of recurrent atrial fibrillation since that time and he is no longer taking anticoagulants.  He had syncope, presumably related to bradycardia from second-degree atrioventricular block and received a dual-chamber permanent pacemaker in November of 2011. He has a MRI conditional Medtronic Revo device.  Pacemaker interrogation shows normal device function, 13% A paced, <1% V paced. Generator voltage 3.00V (ERI 2.81V), no mode switch or VT events, 3.7hours/day of activity (stable).   No Known Allergies  Current Outpatient Prescriptions  Medication Sig Dispense Refill  . amLODipine (NORVASC) 5 MG tablet Take 1 tablet (5 mg total) by mouth daily. Keep appointment for refills 90 tablet 3  . aspirin EC 81 MG tablet Take 81 mg by mouth daily.    [name] atorvastatin (LIPITOR) 40 MG tablet TAKE 1 TABLET (40 MG TOTAL) BY MOUTH DAILY.  90 tablet 0  . glipiZIDE-metformin (METAGLIP) 5-500 MG per tablet Take 1 tablet by mouth 2 (two) times daily before a meal. 1/2 tab in the morning, 1 tab at night.    . irbesartan (AVAPRO) 300 MG tablet Take 1 tablet (300 mg total) by mouth daily. 90 tablet 3  . metoprolol (LOPRESSOR) 50 MG tablet TAKE 1 TABLET (50 MG TOTAL) BY MOUTH 2 (TWO) TIMES DAILY. 180 tablet 3   No current facility-administered medications for this visit.    Past Medical History  Diagnosis Date  . Aortic insufficiency   . Hypertension   . Ventricular tachycardia   . Diabetes   . Dyslipidemia   . AV block, 2nd degree   . Atrial fibrillation   . Ventricular tachycardia, non-sustained   . Mitral insufficiency   . RBBB   . LAFB (left anterior fascicular block)     Past Surgical History  Procedure Laterality Date  . Aortic valve replacement  10/06/2010    [facility] Ease prosthesis  . Permanent pacemaker insertion  07/21/2010    Medtronic  . Back surgery    . Cardiac catheterization  06/30/2010    nonobstructive CAD, heavily ca+ AOV  . Nm myocar perf wall motion  02/28/2007    normal    No family history on file.  History   Social History  . Marital Status: Single    Spouse Name: N/A    Number of Children: N/A  . Years of Education: N/A   Occupational History  . Not on file.   Social History  Main Topics  . Smoking status: Never Smoker   . Smokeless tobacco: Not on file  . Alcohol Use: No  . Drug Use: No  . Sexual Activity: Not on file   Other Topics Concern  . Not on file   Social History Narrative    Review of systems: The patient specifically denies any chest pain at rest or with exertion, dyspnea at rest or with exertion, orthopnea, paroxysmal nocturnal dyspnea, syncope, palpitations, focal neurological deficits, intermittent claudication, lower extremity edema, unexplained weight gain, cough, hemoptysis or wheezing.  The patient also denies abdominal pain, nausea, vomiting,  dysphagia, diarrhea, constipation, polyuria, polydipsia, dysuria, hematuria, frequency, urgency, abnormal bleeding or bruising, fever, chills, unexpected weight changes, mood swings, change in skin or hair texture, change in voice quality, auditory or visual problems, allergic reactions or rashes, new musculoskeletal complaints other than usual "aches and pains".   PHYSICAL EXAM BP 152/80 mmHg  Pulse 77  Ht $R'5\' 11"'Oh$  (1.803 m)  Wt 213 lb 1.6 oz (96.662 kg)  BMI 29.73 kg/m2 General: Alert, oriented x3, no distress  Head: no evidence of trauma, PERRL, EOMI, no exophtalmos or lid lag, no myxedema, no xanthelasma; normal ears, nose and oropharynx  Neck: normal jugular venous pulsations and no hepatojugular reflux; brisk carotid pulses without delay and no carotid bruits  Chest: clear to auscultation, no signs of consolidation by percussion or palpation, normal fremitus, symmetrical and full respiratory excursions; well-healed sternotomy scar and left subclavian pacemaker site  Cardiovascular: normal position and quality of the apical impulse, regular rhythm, normal first and second heart sounds, 2/6 early peaking aortic ejection murmur, 2/6 diastolic murmur along the sternal right and left borders, rubs or gallops  Abdomen: no tenderness or distention, no masses by palpation, no abnormal pulsatility or arterial bruits, normal bowel sounds, no hepatosplenomegaly  Extremities: no clubbing, cyanosis or edema; 2+ radial, ulnar and brachial pulses bilaterally; 2+ right femoral, posterior tibial and dorsalis pedis pulses; 2+ left femoral, posterior tibial and dorsalis pedis pulses; no subclavian or femoral bruits  Neurological: grossly nonfocal   EKG: Sinus rhythm, right bundle branch block and left anterior fascicular block   Lipid Panel     Component Value Date/Time   CHOL 116 06/24/2013 1001   TRIG 110 06/24/2013 1001   HDL 29* 06/24/2013 1001   CHOLHDL 4.0 06/24/2013 1001   VLDL 22  06/24/2013 1001   LDLCALC 65 06/24/2013 1001    BMET    Component Value Date/Time   NA 142 06/24/2013 1001   K 4.7 06/24/2013 1001   CL 106 06/24/2013 1001   CO2 31 06/24/2013 1001   GLUCOSE 78 06/24/2013 1001   BUN 12 06/24/2013 1001   CREATININE 0.83 06/24/2013 1001   CREATININE 0.79 10/09/2010 0919   CALCIUM 9.6 06/24/2013 1001   GFRNONAA >60 10/09/2010 0919   GFRAA  10/09/2010 0919    >60        The eGFR has been calculated using the MDRD equation. This calculation has not been validated in all clinical situations. eGFR's persistently <60 mL/min signify possible Chronic Kidney Disease.     ASSESSMENT AND PLAN  S/P bioprosthetic AVR - 21 mm [facility], Dr. [name] 09/2010 Reevaluate the degree of aortic insufficiency and left ventricular parameters at yearly intervals. At this point he does not have symptoms to suggest that this is hemodynamically important. Echo will also be useful to evaluate for signs of regression of moderate LVH after valve replacement  Pacemaker- Medtronic Revo, Nov 2011  Excellent R waves of approximately 19-20 mV were recorded for the first 9 months following device implantation after which there was an abrupt reduction in R wave amplitude now stable at around 4 mV. The mechanism for this is poorly understood. Problems with T wave over sensing are not an apparent problem on the current device check. He has a very low frequency of ventricular pacing. No changes are made to device settings today.  DM2 (diabetes mellitus, type 2) Good glycemic control.   Hyperlipidemia With the exception of low HDL cholesterol, all his lipid parameters are within the desirable range. Encourage continued weight loss and physical exercise.  Orders Placed This Encounter  Procedures  . Implantable device check  . EKG 12-Lead   No orders of the defined types were placed in this encounter.    Holli Humbles, MD, [place] [phone] office [phone] pager

## 2014-10-14 ENCOUNTER — Encounter (HOSPITAL_COMMUNITY)
Admission: RE | Admit: 2014-10-14 | Discharge: 2014-10-14 | Disposition: A | Discharge status: Home / Self Care | Payer: Self-pay | Source: Ambulatory Visit | Attending: Cardiovascular Disease | Admitting: Cardiovascular Disease

## 2014-10-16 ENCOUNTER — Encounter (HOSPITAL_COMMUNITY)
Admission: RE | Admit: 2014-10-16 | Discharge: 2014-10-16 | Disposition: A | Discharge status: Home / Self Care | Payer: Self-pay | Source: Ambulatory Visit | Attending: Cardiovascular Disease | Admitting: Cardiovascular Disease

## 2014-10-19 ENCOUNTER — Encounter (HOSPITAL_COMMUNITY)
Admission: RE | Admit: 2014-10-19 | Discharge: 2014-10-19 | Disposition: A | Discharge status: Home / Self Care | Payer: Self-pay | Source: Ambulatory Visit | Attending: Cardiovascular Disease | Admitting: Cardiovascular Disease

## 2014-10-20 ENCOUNTER — Ambulatory Visit (HOSPITAL_COMMUNITY)
Admission: RE | Admit: 2014-10-20 | Discharge: 2014-10-20 | Disposition: A | Discharge status: Home / Self Care | Payer: Medicare Other | Source: Ambulatory Visit | Attending: Cardiology | Admitting: Cardiology

## 2014-10-20 DIAGNOSIS — I441 Atrioventricular block, second degree: Secondary | ICD-10-CM | POA: Diagnosis present

## 2014-10-20 DIAGNOSIS — E785 Hyperlipidemia, unspecified: Secondary | ICD-10-CM | POA: Insufficient documentation

## 2014-10-20 DIAGNOSIS — I10 Essential (primary) hypertension: Secondary | ICD-10-CM

## 2014-10-20 DIAGNOSIS — I351 Nonrheumatic aortic (valve) insufficiency: Secondary | ICD-10-CM

## 2014-10-20 DIAGNOSIS — I350 Nonrheumatic aortic (valve) stenosis: Secondary | ICD-10-CM

## 2014-10-20 DIAGNOSIS — I4891 Unspecified atrial fibrillation: Secondary | ICD-10-CM

## 2014-10-20 DIAGNOSIS — E119 Type 2 diabetes mellitus without complications: Secondary | ICD-10-CM | POA: Diagnosis not present

## 2014-10-20 DIAGNOSIS — I9789 Other postprocedural complications and disorders of the circulatory system, not elsewhere classified: Secondary | ICD-10-CM

## 2014-10-20 NOTE — Progress Notes (Signed)
2D Echo Performed 10/20/2014    Marygrace Drought, RCS

## 2014-10-21 ENCOUNTER — Encounter (HOSPITAL_COMMUNITY)
Admission: RE | Admit: 2014-10-21 | Discharge: 2014-10-21 | Disposition: A | Discharge status: Home / Self Care | Payer: Self-pay | Source: Ambulatory Visit | Attending: Cardiovascular Disease | Admitting: Cardiovascular Disease

## 2014-10-23 ENCOUNTER — Encounter: Payer: Self-pay | Admitting: Cardiovascular Disease

## 2014-10-23 ENCOUNTER — Telehealth: Payer: Self-pay | Admitting: *Deleted

## 2014-10-23 ENCOUNTER — Encounter (HOSPITAL_COMMUNITY)
Admission: RE | Admit: 2014-10-23 | Discharge: 2014-10-23 | Disposition: A | Discharge status: Home / Self Care | Payer: Self-pay | Source: Ambulatory Visit | Attending: Cardiovascular Disease | Admitting: Cardiovascular Disease

## 2014-10-23 DIAGNOSIS — T82897D Other specified complication of cardiac prosthetic devices, implants and grafts, subsequent encounter: Secondary | ICD-10-CM

## 2014-10-23 NOTE — Telephone Encounter (Signed)
LM with echo results.  Order placed for repeat in one year.  Patient will be scheduled and notified closer to 12 months.

## 2014-10-23 NOTE — Telephone Encounter (Signed)
-----   Message from Sanda Klein, MD sent at 10/20/2014 11:31 AM EST ----- Moderate leak in the aortic valve prosthesis, no sign of problems with the heart muscle secondary to this. Recheck echo in 12 months.

## 2014-10-26 ENCOUNTER — Encounter (HOSPITAL_COMMUNITY)
Admission: RE | Admit: 2014-10-26 | Discharge: 2014-10-26 | Disposition: A | Discharge status: Home / Self Care | Payer: Self-pay | Source: Ambulatory Visit | Attending: Cardiovascular Disease | Admitting: Cardiovascular Disease

## 2014-10-28 ENCOUNTER — Encounter (HOSPITAL_COMMUNITY)
Admission: RE | Admit: 2014-10-28 | Discharge: 2014-10-28 | Disposition: A | Discharge status: Home / Self Care | Payer: Self-pay | Source: Ambulatory Visit | Attending: Cardiovascular Disease | Admitting: Cardiovascular Disease

## 2014-10-29 ENCOUNTER — Other Ambulatory Visit: Payer: Self-pay | Admitting: Cardiovascular Disease

## 2014-10-29 NOTE — Telephone Encounter (Signed)
Rx(s) sent to pharmacy electronically.  

## 2014-10-30 ENCOUNTER — Encounter (HOSPITAL_COMMUNITY)
Admission: RE | Admit: 2014-10-30 | Discharge: 2014-10-30 | Disposition: A | Discharge status: Home / Self Care | Payer: Self-pay | Source: Ambulatory Visit | Attending: Cardiovascular Disease | Admitting: Cardiovascular Disease

## 2014-11-02 ENCOUNTER — Encounter (HOSPITAL_COMMUNITY)
Admission: RE | Admit: 2014-11-02 | Discharge: 2014-11-02 | Disposition: A | Discharge status: Home / Self Care | Payer: Self-pay | Source: Ambulatory Visit | Attending: Cardiovascular Disease | Admitting: Cardiovascular Disease

## 2014-11-04 ENCOUNTER — Encounter (HOSPITAL_COMMUNITY)
Admission: RE | Admit: 2014-11-04 | Discharge: 2014-11-04 | Disposition: A | Discharge status: Home / Self Care | Payer: Self-pay | Source: Ambulatory Visit | Attending: Cardiovascular Disease | Admitting: Cardiovascular Disease

## 2014-11-06 ENCOUNTER — Encounter (HOSPITAL_COMMUNITY)
Admission: RE | Admit: 2014-11-06 | Discharge: 2014-11-06 | Disposition: A | Discharge status: Home / Self Care | Payer: Self-pay | Source: Ambulatory Visit | Attending: Cardiovascular Disease | Admitting: Cardiovascular Disease

## 2014-11-09 ENCOUNTER — Encounter (HOSPITAL_COMMUNITY)
Admission: RE | Admit: 2014-11-09 | Discharge: 2014-11-09 | Disposition: A | Discharge status: Home / Self Care | Payer: Self-pay | Source: Ambulatory Visit | Attending: Cardiovascular Disease | Admitting: Cardiovascular Disease

## 2014-11-11 ENCOUNTER — Encounter (HOSPITAL_COMMUNITY)
Admission: RE | Admit: 2014-11-11 | Discharge: 2014-11-11 | Disposition: A | Discharge status: Home / Self Care | Payer: Self-pay | Source: Ambulatory Visit | Attending: Cardiovascular Disease | Admitting: Cardiovascular Disease

## 2014-11-11 DIAGNOSIS — Z952 Presence of prosthetic heart valve: Secondary | ICD-10-CM | POA: Insufficient documentation

## 2014-11-11 DIAGNOSIS — Z950 Presence of cardiac pacemaker: Secondary | ICD-10-CM | POA: Insufficient documentation

## 2014-11-11 DIAGNOSIS — I359 Nonrheumatic aortic valve disorder, unspecified: Secondary | ICD-10-CM | POA: Insufficient documentation

## 2014-11-13 ENCOUNTER — Encounter (HOSPITAL_COMMUNITY)
Admission: RE | Admit: 2014-11-13 | Discharge: 2014-11-13 | Disposition: A | Discharge status: Home / Self Care | Payer: Self-pay | Source: Ambulatory Visit | Attending: Cardiovascular Disease | Admitting: Cardiovascular Disease

## 2014-11-16 ENCOUNTER — Encounter (HOSPITAL_COMMUNITY)
Admission: RE | Admit: 2014-11-16 | Discharge: 2014-11-16 | Disposition: A | Discharge status: Home / Self Care | Payer: Self-pay | Source: Ambulatory Visit | Attending: Cardiovascular Disease | Admitting: Cardiovascular Disease

## 2014-11-18 ENCOUNTER — Encounter (HOSPITAL_COMMUNITY)
Admission: RE | Admit: 2014-11-18 | Discharge: 2014-11-18 | Disposition: A | Discharge status: Home / Self Care | Payer: Self-pay | Source: Ambulatory Visit | Attending: Cardiovascular Disease | Admitting: Cardiovascular Disease

## 2014-11-20 ENCOUNTER — Encounter (HOSPITAL_COMMUNITY)
Admission: RE | Admit: 2014-11-20 | Discharge: 2014-11-20 | Disposition: A | Discharge status: Home / Self Care | Payer: Self-pay | Source: Ambulatory Visit | Attending: Cardiovascular Disease | Admitting: Cardiovascular Disease

## 2014-11-23 ENCOUNTER — Telehealth: Payer: Self-pay | Admitting: Cardiovascular Disease

## 2014-11-23 ENCOUNTER — Encounter (HOSPITAL_COMMUNITY)
Admission: RE | Admit: 2014-11-23 | Discharge: 2014-11-23 | Disposition: A | Discharge status: Home / Self Care | Payer: Self-pay | Source: Ambulatory Visit | Attending: Cardiovascular Disease | Admitting: Cardiovascular Disease

## 2014-11-23 MED ORDER — METOPROLOL TARTRATE 50 MG PO TABS: 50.0000 mg | ORAL_TABLET | Freq: Two times a day (BID) | ORAL | Status: DC

## 2014-11-23 NOTE — Telephone Encounter (Signed)
Rx submitted

## 2014-11-23 NOTE — Telephone Encounter (Signed)
°  1. Which medications need to be refilled? Metoprolol  2. Which pharmacy is medication to be sent to?[phone] 3. Do they need a 30 day or 90 day supply? 90 and refills  4. Would they like a call back once the medication has been sent to the pharmacy? no

## 2014-11-25 ENCOUNTER — Encounter (HOSPITAL_COMMUNITY)
Admission: RE | Admit: 2014-11-25 | Discharge: 2014-11-25 | Disposition: A | Discharge status: Home / Self Care | Payer: Self-pay | Source: Ambulatory Visit | Attending: Cardiovascular Disease | Admitting: Cardiovascular Disease

## 2014-11-27 ENCOUNTER — Encounter (HOSPITAL_COMMUNITY)
Admission: RE | Admit: 2014-11-27 | Discharge: 2014-11-27 | Disposition: A | Discharge status: Home / Self Care | Payer: Self-pay | Source: Ambulatory Visit | Attending: Cardiovascular Disease | Admitting: Cardiovascular Disease

## 2014-11-30 ENCOUNTER — Encounter (HOSPITAL_COMMUNITY)
Admission: RE | Admit: 2014-11-30 | Discharge: 2014-11-30 | Disposition: A | Discharge status: Home / Self Care | Payer: Self-pay | Source: Ambulatory Visit | Attending: Cardiovascular Disease | Admitting: Cardiovascular Disease

## 2014-11-30 ENCOUNTER — Other Ambulatory Visit: Payer: Self-pay | Admitting: Cardiovascular Disease

## 2014-11-30 NOTE — Telephone Encounter (Signed)
Rx has been sent to the pharmacy electronically. ° °

## 2014-12-02 ENCOUNTER — Encounter (HOSPITAL_COMMUNITY)
Admission: RE | Admit: 2014-12-02 | Discharge: 2014-12-02 | Disposition: A | Discharge status: Home / Self Care | Payer: Self-pay | Source: Ambulatory Visit | Attending: Cardiovascular Disease | Admitting: Cardiovascular Disease

## 2014-12-04 ENCOUNTER — Encounter (HOSPITAL_COMMUNITY)
Admission: RE | Admit: 2014-12-04 | Discharge: 2014-12-04 | Disposition: A | Discharge status: Home / Self Care | Payer: Self-pay | Source: Ambulatory Visit | Attending: Cardiovascular Disease | Admitting: Cardiovascular Disease

## 2014-12-07 ENCOUNTER — Encounter (HOSPITAL_COMMUNITY)
Admission: RE | Admit: 2014-12-07 | Discharge: 2014-12-07 | Disposition: A | Discharge status: Home / Self Care | Payer: Self-pay | Source: Ambulatory Visit | Attending: Cardiovascular Disease | Admitting: Cardiovascular Disease

## 2014-12-09 ENCOUNTER — Encounter (HOSPITAL_COMMUNITY)
Admission: RE | Admit: 2014-12-09 | Discharge: 2014-12-09 | Disposition: A | Discharge status: Home / Self Care | Payer: Self-pay | Source: Ambulatory Visit | Attending: Cardiovascular Disease | Admitting: Cardiovascular Disease

## 2014-12-11 ENCOUNTER — Encounter (HOSPITAL_COMMUNITY)
Admission: RE | Admit: 2014-12-11 | Discharge: 2014-12-11 | Disposition: A | Discharge status: Home / Self Care | Payer: Self-pay | Source: Ambulatory Visit | Attending: Cardiovascular Disease | Admitting: Cardiovascular Disease

## 2014-12-11 DIAGNOSIS — Z950 Presence of cardiac pacemaker: Secondary | ICD-10-CM | POA: Insufficient documentation

## 2014-12-11 DIAGNOSIS — Z952 Presence of prosthetic heart valve: Secondary | ICD-10-CM | POA: Insufficient documentation

## 2014-12-11 DIAGNOSIS — I359 Nonrheumatic aortic valve disorder, unspecified: Secondary | ICD-10-CM | POA: Insufficient documentation

## 2014-12-14 ENCOUNTER — Encounter (HOSPITAL_COMMUNITY)
Admission: RE | Admit: 2014-12-14 | Discharge: 2014-12-14 | Disposition: A | Discharge status: Home / Self Care | Payer: Self-pay | Source: Ambulatory Visit | Attending: Cardiovascular Disease | Admitting: Cardiovascular Disease

## 2014-12-16 ENCOUNTER — Encounter (HOSPITAL_COMMUNITY)
Admission: RE | Admit: 2014-12-16 | Discharge: 2014-12-16 | Disposition: A | Discharge status: Home / Self Care | Payer: Self-pay | Source: Ambulatory Visit | Attending: Cardiovascular Disease | Admitting: Cardiovascular Disease

## 2014-12-18 ENCOUNTER — Encounter (HOSPITAL_COMMUNITY)
Admission: RE | Admit: 2014-12-18 | Discharge: 2014-12-18 | Disposition: A | Discharge status: Home / Self Care | Payer: Self-pay | Source: Ambulatory Visit | Attending: Cardiovascular Disease | Admitting: Cardiovascular Disease

## 2014-12-21 ENCOUNTER — Encounter (HOSPITAL_COMMUNITY)
Admission: RE | Admit: 2014-12-21 | Discharge: 2014-12-21 | Disposition: A | Discharge status: Home / Self Care | Payer: Self-pay | Source: Ambulatory Visit | Attending: Cardiovascular Disease | Admitting: Cardiovascular Disease

## 2014-12-23 ENCOUNTER — Encounter (HOSPITAL_COMMUNITY)
Admission: RE | Admit: 2014-12-23 | Discharge: 2014-12-23 | Disposition: A | Discharge status: Home / Self Care | Payer: Self-pay | Source: Ambulatory Visit | Attending: Cardiovascular Disease | Admitting: Cardiovascular Disease

## 2014-12-25 ENCOUNTER — Encounter (HOSPITAL_COMMUNITY)
Admission: RE | Admit: 2014-12-25 | Discharge: 2014-12-25 | Disposition: A | Discharge status: Home / Self Care | Payer: Self-pay | Source: Ambulatory Visit | Attending: Cardiovascular Disease | Admitting: Cardiovascular Disease

## 2014-12-28 ENCOUNTER — Encounter (HOSPITAL_COMMUNITY)
Admission: RE | Admit: 2014-12-28 | Discharge: 2014-12-28 | Disposition: A | Discharge status: Home / Self Care | Payer: Self-pay | Source: Ambulatory Visit | Attending: Cardiovascular Disease | Admitting: Cardiovascular Disease

## 2014-12-30 ENCOUNTER — Encounter (HOSPITAL_COMMUNITY)
Admission: RE | Admit: 2014-12-30 | Discharge: 2014-12-30 | Disposition: A | Discharge status: Home / Self Care | Payer: Self-pay | Source: Ambulatory Visit | Attending: Cardiovascular Disease | Admitting: Cardiovascular Disease

## 2015-01-01 ENCOUNTER — Encounter (HOSPITAL_COMMUNITY)
Admission: RE | Admit: 2015-01-01 | Discharge: 2015-01-01 | Disposition: A | Discharge status: Home / Self Care | Payer: Self-pay | Source: Ambulatory Visit | Attending: Cardiovascular Disease | Admitting: Cardiovascular Disease

## 2015-01-04 ENCOUNTER — Encounter (HOSPITAL_COMMUNITY)
Admission: RE | Admit: 2015-01-04 | Discharge: 2015-01-04 | Disposition: A | Discharge status: Home / Self Care | Payer: Self-pay | Source: Ambulatory Visit | Attending: Cardiovascular Disease | Admitting: Cardiovascular Disease

## 2015-01-06 ENCOUNTER — Encounter (HOSPITAL_COMMUNITY)
Admission: RE | Admit: 2015-01-06 | Discharge: 2015-01-06 | Disposition: A | Discharge status: Home / Self Care | Payer: Self-pay | Source: Ambulatory Visit | Attending: Cardiovascular Disease | Admitting: Cardiovascular Disease

## 2015-01-08 ENCOUNTER — Encounter (HOSPITAL_COMMUNITY)
Admission: RE | Admit: 2015-01-08 | Discharge: 2015-01-08 | Disposition: A | Discharge status: Home / Self Care | Payer: Self-pay | Source: Ambulatory Visit | Attending: Cardiovascular Disease | Admitting: Cardiovascular Disease

## 2015-01-11 ENCOUNTER — Encounter (HOSPITAL_COMMUNITY): Payer: Self-pay

## 2015-01-12 ENCOUNTER — Ambulatory Visit (INDEPENDENT_AMBULATORY_CARE_PROVIDER_SITE_OTHER): Payer: Medicare Other | Admitting: *Deleted

## 2015-01-12 DIAGNOSIS — I441 Atrioventricular block, second degree: Secondary | ICD-10-CM | POA: Diagnosis not present

## 2015-01-12 NOTE — Progress Notes (Signed)
Remote pacemaker transmission.   

## 2015-01-13 ENCOUNTER — Encounter (HOSPITAL_COMMUNITY): Payer: Self-pay

## 2015-01-15 ENCOUNTER — Encounter (HOSPITAL_COMMUNITY): Payer: Self-pay

## 2015-01-16 LAB — CUP PACEART REMOTE DEVICE CHECK
Brady Statistic AP VP Percent: 0 %
Brady Statistic AS VP Percent: 0 %
Brady Statistic AS VS Percent: 93.13 %
Brady Statistic RV Percent Paced: 0 %
Date Time Interrogation Session: 20160503112835
Lead Channel Impedance Value: 400 Ohm
Lead Channel Impedance Value: 440 Ohm
Lead Channel Sensing Intrinsic Amplitude: 1.984 mV
Lead Channel Setting Pacing Amplitude: 2 V
Lead Channel Setting Pacing Pulse Width: 0.4 ms
Lead Channel Setting Sensing Sensitivity: 0.6 mV
MDC IDC MSMT BATTERY VOLTAGE: 3 V
MDC IDC MSMT LEADCHNL RV SENSING INTR AMPL: 2.388 mV
MDC IDC SET LEADCHNL RV PACING AMPLITUDE: 2.5 V
MDC IDC STAT BRADY AP VS PERCENT: 6.87 %
MDC IDC STAT BRADY RA PERCENT PACED: 6.87 %
Zone Setting Detection Interval: 400 ms
Zone Setting Detection Interval: 400 ms

## 2015-01-18 ENCOUNTER — Encounter (HOSPITAL_COMMUNITY): Payer: Self-pay

## 2015-01-20 ENCOUNTER — Encounter (HOSPITAL_COMMUNITY): Payer: Self-pay

## 2015-01-21 ENCOUNTER — Encounter: Payer: Self-pay | Admitting: Cardiology

## 2015-01-22 ENCOUNTER — Encounter (HOSPITAL_COMMUNITY): Payer: Self-pay

## 2015-01-25 ENCOUNTER — Encounter (HOSPITAL_COMMUNITY): Payer: Self-pay

## 2015-01-26 ENCOUNTER — Encounter: Payer: Self-pay | Admitting: Cardiovascular Disease

## 2015-01-27 ENCOUNTER — Encounter (HOSPITAL_COMMUNITY): Payer: Self-pay

## 2015-01-29 ENCOUNTER — Encounter (HOSPITAL_COMMUNITY): Payer: Self-pay

## 2015-02-01 ENCOUNTER — Encounter (HOSPITAL_COMMUNITY): Payer: Self-pay

## 2015-02-03 ENCOUNTER — Encounter (HOSPITAL_COMMUNITY): Payer: Self-pay

## 2015-02-05 ENCOUNTER — Encounter (HOSPITAL_COMMUNITY): Payer: Self-pay

## 2015-02-10 ENCOUNTER — Encounter (HOSPITAL_COMMUNITY): Payer: Self-pay

## 2015-02-12 ENCOUNTER — Encounter (HOSPITAL_COMMUNITY): Payer: Self-pay

## 2015-02-15 ENCOUNTER — Encounter (HOSPITAL_COMMUNITY): Payer: Self-pay

## 2015-02-17 ENCOUNTER — Encounter (HOSPITAL_COMMUNITY): Payer: Self-pay

## 2015-02-19 ENCOUNTER — Encounter (HOSPITAL_COMMUNITY): Payer: Self-pay

## 2015-02-22 ENCOUNTER — Encounter (HOSPITAL_COMMUNITY): Payer: Self-pay

## 2015-02-24 ENCOUNTER — Encounter (HOSPITAL_COMMUNITY): Payer: Self-pay

## 2015-02-26 ENCOUNTER — Encounter (HOSPITAL_COMMUNITY): Payer: Self-pay

## 2015-03-01 ENCOUNTER — Encounter (HOSPITAL_COMMUNITY): Payer: Self-pay

## 2015-03-03 ENCOUNTER — Encounter (HOSPITAL_COMMUNITY): Payer: Self-pay

## 2015-03-05 ENCOUNTER — Encounter (HOSPITAL_COMMUNITY): Payer: Self-pay

## 2015-03-08 ENCOUNTER — Encounter (HOSPITAL_COMMUNITY): Payer: Self-pay

## 2015-03-10 ENCOUNTER — Encounter (HOSPITAL_COMMUNITY): Payer: Self-pay

## 2015-03-12 ENCOUNTER — Encounter (HOSPITAL_COMMUNITY): Payer: Self-pay

## 2015-03-17 ENCOUNTER — Encounter (HOSPITAL_COMMUNITY): Payer: Self-pay

## 2015-03-19 ENCOUNTER — Encounter (HOSPITAL_COMMUNITY): Payer: Self-pay

## 2015-03-22 ENCOUNTER — Encounter (HOSPITAL_COMMUNITY): Payer: Self-pay

## 2015-03-24 ENCOUNTER — Encounter (HOSPITAL_COMMUNITY): Payer: Self-pay

## 2015-03-26 ENCOUNTER — Encounter (HOSPITAL_COMMUNITY): Payer: Self-pay

## 2015-03-29 ENCOUNTER — Encounter (HOSPITAL_COMMUNITY): Payer: Self-pay

## 2015-03-31 ENCOUNTER — Encounter (HOSPITAL_COMMUNITY): Payer: Self-pay

## 2015-04-02 ENCOUNTER — Encounter (HOSPITAL_COMMUNITY): Payer: Self-pay

## 2015-04-05 ENCOUNTER — Encounter (HOSPITAL_COMMUNITY): Payer: Self-pay

## 2015-04-07 ENCOUNTER — Encounter (HOSPITAL_COMMUNITY): Payer: Self-pay

## 2015-04-09 ENCOUNTER — Encounter (HOSPITAL_COMMUNITY): Payer: Self-pay

## 2015-04-13 ENCOUNTER — Ambulatory Visit (INDEPENDENT_AMBULATORY_CARE_PROVIDER_SITE_OTHER): Payer: Medicare Other | Admitting: *Deleted

## 2015-04-13 DIAGNOSIS — I441 Atrioventricular block, second degree: Secondary | ICD-10-CM | POA: Diagnosis not present

## 2015-04-14 NOTE — Progress Notes (Signed)
Remote pacemaker transmission.   

## 2015-04-19 LAB — CUP PACEART REMOTE DEVICE CHECK
Brady Statistic AP VP Percent: 0.16 %
Brady Statistic AP VS Percent: 14.29 %
Brady Statistic AS VS Percent: 85.54 %
Brady Statistic RV Percent Paced: 0.16 %
Date Time Interrogation Session: 20160802130917
Lead Channel Impedance Value: 448 Ohm
Lead Channel Impedance Value: 464 Ohm
Lead Channel Sensing Intrinsic Amplitude: 1.984 mV
Lead Channel Sensing Intrinsic Amplitude: 3.411 mV
Lead Channel Setting Pacing Amplitude: 2 V
Lead Channel Setting Pacing Pulse Width: 0.4 ms
Lead Channel Setting Sensing Sensitivity: 0.6 mV
MDC IDC MSMT BATTERY VOLTAGE: 2.99 V
MDC IDC SET LEADCHNL RV PACING AMPLITUDE: 2.5 V
MDC IDC STAT BRADY AS VP PERCENT: 0 %
MDC IDC STAT BRADY RA PERCENT PACED: 14.45 %
Zone Setting Detection Interval: 400 ms
Zone Setting Detection Interval: 400 ms

## 2015-05-05 ENCOUNTER — Encounter: Payer: Self-pay | Admitting: Cardiology

## 2015-05-07 ENCOUNTER — Encounter: Payer: Self-pay | Admitting: Cardiovascular Disease

## 2015-07-15 ENCOUNTER — Ambulatory Visit (INDEPENDENT_AMBULATORY_CARE_PROVIDER_SITE_OTHER): Payer: Medicare Other | Admitting: *Deleted

## 2015-07-15 ENCOUNTER — Telehealth: Payer: Self-pay | Admitting: Cardiology

## 2015-07-15 DIAGNOSIS — I441 Atrioventricular block, second degree: Secondary | ICD-10-CM

## 2015-07-15 NOTE — Telephone Encounter (Signed)
LMOVM reminding pt to send remote transmission.   

## 2015-07-16 NOTE — Progress Notes (Signed)
Remote pacemaker transmission.   

## 2015-08-04 ENCOUNTER — Encounter: Payer: Self-pay | Admitting: Cardiology

## 2015-08-04 LAB — CUP PACEART REMOTE DEVICE CHECK
Brady Statistic AP VP Percent: 0.04 %
Brady Statistic AS VP Percent: 0.01 %
Brady Statistic AS VS Percent: 87.14 %
Brady Statistic RA Percent Paced: 12.86 %
Date Time Interrogation Session: 20161103170501
Implantable Lead Implant Date: 20111110
Implantable Lead Location: 753859
Implantable Lead Model: 5086
Implantable Lead Model: 5086
Lead Channel Impedance Value: 472 Ohm
Lead Channel Sensing Intrinsic Amplitude: 1.897 mV
Lead Channel Setting Pacing Amplitude: 2 V
Lead Channel Setting Pacing Amplitude: 2.5 V
Lead Channel Setting Sensing Sensitivity: 0.6 mV
MDC IDC LEAD IMPLANT DT: 20111110
MDC IDC LEAD LOCATION: 753860
MDC IDC MSMT BATTERY VOLTAGE: 2.99 V
MDC IDC MSMT LEADCHNL RA IMPEDANCE VALUE: 424 Ohm
MDC IDC MSMT LEADCHNL RV SENSING INTR AMPL: 3.752 mV
MDC IDC SET LEADCHNL RV PACING PULSEWIDTH: 0.4 ms
MDC IDC STAT BRADY AP VS PERCENT: 12.82 %
MDC IDC STAT BRADY RV PERCENT PACED: 0.05 %

## 2015-11-02 ENCOUNTER — Encounter: Payer: Self-pay | Admitting: Cardiovascular Disease

## 2015-11-02 ENCOUNTER — Ambulatory Visit (INDEPENDENT_AMBULATORY_CARE_PROVIDER_SITE_OTHER): Payer: Medicare Other | Admitting: Cardiovascular Disease

## 2015-11-02 VITALS — BP 137/70 | HR 76 | Ht 71.0 in | Wt 212.4 lb

## 2015-11-02 DIAGNOSIS — I441 Atrioventricular block, second degree: Secondary | ICD-10-CM | POA: Diagnosis not present

## 2015-11-02 DIAGNOSIS — E785 Hyperlipidemia, unspecified: Secondary | ICD-10-CM

## 2015-11-02 DIAGNOSIS — I10 Essential (primary) hypertension: Secondary | ICD-10-CM

## 2015-11-02 DIAGNOSIS — Z950 Presence of cardiac pacemaker: Secondary | ICD-10-CM

## 2015-11-02 DIAGNOSIS — Z79899 Other long term (current) drug therapy: Secondary | ICD-10-CM

## 2015-11-02 DIAGNOSIS — I9789 Other postprocedural complications and disorders of the circulatory system, not elsewhere classified: Secondary | ICD-10-CM

## 2015-11-02 DIAGNOSIS — Z954 Presence of other heart-valve replacement: Secondary | ICD-10-CM | POA: Diagnosis not present

## 2015-11-02 DIAGNOSIS — I4891 Unspecified atrial fibrillation: Secondary | ICD-10-CM

## 2015-11-02 DIAGNOSIS — Z952 Presence of prosthetic heart valve: Secondary | ICD-10-CM

## 2015-11-02 LAB — COMPREHENSIVE METABOLIC PANEL
ALT: 20 U/L (ref 9–46)
AST: 19 U/L (ref 10–35)
Albumin: 4.2 g/dL (ref 3.6–5.1)
Alkaline Phosphatase: 63 U/L (ref 40–115)
BUN: 11 mg/dL (ref 7–25)
CO2: 29 mmol/L (ref 20–31)
CREATININE: 1.04 mg/dL (ref 0.70–1.18)
Calcium: 9.4 mg/dL (ref 8.6–10.3)
Chloride: 101 mmol/L (ref 98–110)
Glucose, Bld: 95 mg/dL (ref 65–99)
Potassium: 4.6 mmol/L (ref 3.5–5.3)
SODIUM: 139 mmol/L (ref 135–146)
Total Bilirubin: 1 mg/dL (ref 0.2–1.2)
Total Protein: 7 g/dL (ref 6.1–8.1)

## 2015-11-02 LAB — LIPID PANEL
CHOL/HDL RATIO: 4.5 ratio (ref <=5.0)
Cholesterol: 113 mg/dL — ABNORMAL LOW (ref 125–200)
HDL: 25 mg/dL — ABNORMAL LOW (ref >=40)
LDL Cholesterol: 69 mg/dL (ref <130)
Triglycerides: 97 mg/dL (ref <150)
VLDL: 19 mg/dL (ref <30)

## 2015-11-02 LAB — HEMOGLOBIN A1C
Hgb A1c MFr Bld: 6.3 % — ABNORMAL HIGH (ref <5.7)
MEAN PLASMA GLUCOSE: 134 mg/dL — AB (ref <117)

## 2015-11-02 NOTE — Patient Instructions (Signed)
Your physician recommends that you return for lab work in: AT [place] has requested that you have a YEARLY echocardiogram. Echocardiography is a painless test that uses sound waves to create images of your heart. It provides your doctor with information about the size and shape of your heart and how well your heart's chambers and valves are working. This procedure takes approximately one hour. There are no restrictions for this procedure.  Remote monitoring is used to monitor your Pacemaker from home. This monitoring reduces the number of office visits required to check your device to one time per year. It allows [place] to monitor the functioning of your device to ensure it is working properly. You are scheduled for a device check from home on Jan 31, 2016. You may send your transmission at any time that day. If you have a wireless device, the transmission will be sent automatically. After your physician reviews your transmission, you will receive a postcard with your next transmission date.  Dr. Sallyanne Kuster recommends that you schedule a follow-up appointment in: [place] - [employer]

## 2015-11-02 NOTE — Progress Notes (Signed)
Patient ID: [name] [name], male   DOB: 02-01-1946, 70 y.o.   MRN: [id number]    Cardiology Office Note    Date:  11/02/2015   ID:  [name] [name], DOB 14-Feb-1946, MRN [id number]  PCP:  No PCP Per Patient  Cardiologist:   Sanda Klein, MD   Chief Complaint  Patient presents with  . Follow-up    no chest pain, no shortness of breath, no edema, no pain or cramping in legs, no lightheadedness or dizziness    History of Present Illness:  [name] [name] is a 70 y.o. male who presents for follow-up for valvular heart disease and AV node conduction abnormalities with pacemaker implantation. He underwent aortic valve replacement in January 2012 (21 mm bioprosthesis) for symptomatic aortic stenosis. Early on, his prosthesis showed evidence of mild to moderate aortic insufficiency that has not progressed over time. He has preserved left ventricular systolic function. In November 2011 she received a dual-chamber permanent pacemaker for syncope that was likely related to second degree atrioventricular block. He has an MRI conditional Medtronic Revo device. 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  [name] [name] feels well. He denies exertional angina or dyspnea, palpitations, leg edema, focal neurological complaints, syncope or intermittent claudication. He is still active roughly 2.4 hours per day, a little less when compared to his historical trend. He admits having less motivation to exercise Pacemaker interrogation shows only 9% atrial pacing and less than 1% ventricular pacing. Battery voltage is 2.99 V (recommended replacement at 2.81 V).  He reports that his last glycohemoglobin was less than 6%. Despite this he remains severely overweight, almost obese.  Past Medical History  Diagnosis Date  . Aortic insufficiency   . Hypertension   . Ventricular tachycardia ([place])   . Diabetes ([place])   . Dyslipidemia   . AV block, 2nd  degree   . Atrial fibrillation ([place])   . Ventricular tachycardia, non-sustained ([place])   . Mitral insufficiency   . RBBB   . LAFB (left anterior fascicular block)     Past Surgical History  Procedure Laterality Date  . Aortic valve replacement  10/06/2010    [facility] Ease prosthesis  . Permanent pacemaker insertion  07/21/2010    Medtronic  . Back surgery    . Cardiac catheterization  06/30/2010    nonobstructive CAD, heavily ca+ AOV  . Nm myocar perf wall motion  02/28/2007    normal    Outpatient Prescriptions Prior to Visit  Medication Sig Dispense Refill  . amLODipine (NORVASC) 5 MG tablet TAKE 1 TABLET BY MOUTH DAILY. KEEP APPOINTMENT FOR REFILLS 90 tablet 3  . aspirin EC 81 MG tablet Take 81 mg by mouth daily.    [name] atorvastatin (LIPITOR) 40 MG tablet TAKE 1 TABLET (40 MG TOTAL) BY MOUTH DAILY. 90 tablet 3  . glipiZIDE-metformin (METAGLIP) 5-500 MG per tablet Take 1 tablet by mouth 2 (two) times daily before a meal. 1/2 tab in the morning, 1 tab at night.    . irbesartan (AVAPRO) 300 MG tablet TAKE 1 TABLET (300 MG TOTAL) BY MOUTH DAILY. 90 tablet 3  . metoprolol (LOPRESSOR) 50 MG tablet Take 1 tablet (50 mg total) by mouth 2 (two) times daily. 180 tablet 3   No facility-administered medications prior to visit.     Allergies:   Review of patient's allergies indicates no known allergies.   Social History   Social History  . Marital Status: Single  Spouse Name: N/A  . Number of Children: N/A  . Years of Education: N/A   Social History Main Topics  . Smoking status: Never Smoker   . Smokeless tobacco: None  . Alcohol Use: No  . Drug Use: No  . Sexual Activity: Not Asked   Other Topics Concern  . None   Social History Narrative     Family History:  The patient's brother recently underwent prostatectomy for prostate cancer  ROS:   Please see the history of present illness.    ROS All other systems reviewed and are negative.   PHYSICAL EXAM:   VS:   BP 137/70 mmHg  Pulse 76  Ht 5\' 11"  (1.803 m)  Wt 96.361 kg (212 lb 7 oz)  BMI 29.64 kg/m2   GEN: Well nourished, well developed, in no acute distress HEENT: normal Neck: no JVD, carotid bruits, or masses Cardiac: RRR, widely split second heart sound; grade 2/6 early peaking aortic ejection systolic more murmur, grade 1/6 decrescendo aortic insufficiency murmur at the right lower sternal border, rubs, or gallops,no edema , healthy left subclavian pacemaker site Respiratory:  clear to auscultation bilaterally, normal work of breathing GI: soft, nontender, nondistended, + BS MS: no deformity or atrophy Skin: warm and dry, no rash Neuro:  Alert and Oriented x 3, Strength and sensation are intact Psych: euthymic mood, full affect  Wt Readings from Last 3 Encounters:  11/02/15 96.361 kg (212 lb 7 oz)  10/13/14 96.662 kg (213 lb 1.6 oz)  10/09/13 97.886 kg (215 lb 12.8 oz)      Studies/Labs Reviewed:   EKG:  EKG is ordered today.  The ekg ordered today demonstrates normal sinus rhythm with a borderline PR interval of 190 ms, right bundle branch block, left anterior fascicular block, QTC 481 ms  Recent Labs: No results found for requested labs within last 365 days.   Lipid Panel    Component Value Date/Time   CHOL 119 10/13/2014 0945   TRIG 109 10/13/2014 0945   HDL 30* 10/13/2014 0945   CHOLHDL 4.0 10/13/2014 0945   VLDL 22 10/13/2014 0945   LDLCALC 67 10/13/2014 0945     ASSESSMENT:    1. Second degree AV block, Mobitz type I    2. Pacemaker- Medtronic Revo, Nov 2011   3. 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 [name] 09/2010   4. Hyperlipidemia   5. Essential hypertension   6. Postoperative atrial fibrillation (HCC)   7. Medication management      PLAN:  In order of problems listed above:  1. Second degree AV block with evidence of infrahisian disease 2. Normal function of the dual-chamber permanent pacemaker, very infrequent ventricular pacing;  continue [facility] remote downloads every 3 months 3. S/P AVR with unexpected degree of aortic insufficiency, not hemodynamically significant at this point and without evidence of progression on echoes. Repeat echo this year. 4. HLP: Excellent lipid profile last year except for low HDL, weight loss would be helpful 5. HTN: Fair blood pressure control, target 130/80 due to diabetes 6. PAF: No recurrence of atrial fibrillation since the immediate postop period; not receiving anticoagulants 7. Recheck labs    Medication Adjustments/Labs and Tests Ordered: Current medicines are reviewed at length with the patient today.  Concerns regarding medicines are outlined above.  Medication changes, Labs and Tests ordered today are listed in the Patient Instructions below. Patient Instructions  Your physician recommends that you return for lab work in: AT [employer]  FASTING AT [facility]  Your physician has requested that you have a YEARLY echocardiogram. Echocardiography is a painless test that uses sound waves to create images of your heart. It provides your doctor with information about the size and shape of your heart and how well your heart's chambers and valves are working. This procedure takes approximately one hour. There are no restrictions for this procedure.  Remote monitoring is used to monitor your Pacemaker from home. This monitoring reduces the number of office visits required to check your device to one time per year. It allows [place] to monitor the functioning of your device to ensure it is working properly. You are scheduled for a device check from home on Jan 31, 2016. You may send your transmission at any time that day. If you have a wireless device, the transmission will be sent automatically. After your physician reviews your transmission, you will receive a postcard with your next transmission date.  Dr. Sallyanne Kuster recommends that you schedule a follow-up appointment in: [place]             Mikael Spray, MD  11/02/2015 4:00 PM    [facility] [place], [place], [place]  [address] Phone: [phone]; Fax: [phone]

## 2015-11-03 NOTE — Progress Notes (Signed)
LM with lab results

## 2015-11-04 ENCOUNTER — Telehealth: Payer: Self-pay | Admitting: *Deleted

## 2015-11-04 DIAGNOSIS — I351 Nonrheumatic aortic (valve) insufficiency: Secondary | ICD-10-CM

## 2015-11-04 NOTE — Telephone Encounter (Signed)
-----   Message from Corinna Lines sent at 11/02/2015 11:11 AM EST ----- Regarding: order   Pamala Hurry  I need an echo that goes to 11-23-15 as the patient wanted to wait a few weeks.  Thanks

## 2015-11-05 ENCOUNTER — Telehealth: Payer: Self-pay

## 2015-11-05 MED ORDER — IRBESARTAN 300 MG PO TABS: ORAL_TABLET | ORAL | Status: DC

## 2015-11-05 MED ORDER — ATORVASTATIN CALCIUM 40 MG PO TABS: ORAL_TABLET | ORAL | Status: DC

## 2015-11-05 MED ORDER — METOPROLOL TARTRATE 50 MG PO TABS: 50.0000 mg | ORAL_TABLET | Freq: Two times a day (BID) | ORAL | Status: DC

## 2015-11-05 MED ORDER — AMLODIPINE BESYLATE 5 MG PO TABS: ORAL_TABLET | ORAL | Status: DC

## 2015-11-05 NOTE — Telephone Encounter (Signed)
Patient walked in office requesting 90 day refills.Refills sent to pharmacy.

## 2015-11-23 ENCOUNTER — Other Ambulatory Visit: Payer: Self-pay

## 2015-11-23 ENCOUNTER — Ambulatory Visit (HOSPITAL_COMMUNITY): Payer: Medicare Other | Attending: Internal Medicine

## 2015-11-23 DIAGNOSIS — Z953 Presence of xenogenic heart valve: Secondary | ICD-10-CM | POA: Diagnosis not present

## 2015-11-23 DIAGNOSIS — I359 Nonrheumatic aortic valve disorder, unspecified: Secondary | ICD-10-CM | POA: Diagnosis present

## 2015-11-23 DIAGNOSIS — I119 Hypertensive heart disease without heart failure: Secondary | ICD-10-CM | POA: Insufficient documentation

## 2015-11-23 DIAGNOSIS — E785 Hyperlipidemia, unspecified: Secondary | ICD-10-CM | POA: Diagnosis not present

## 2015-11-23 DIAGNOSIS — I071 Rheumatic tricuspid insufficiency: Secondary | ICD-10-CM | POA: Diagnosis not present

## 2015-11-23 DIAGNOSIS — I351 Nonrheumatic aortic (valve) insufficiency: Secondary | ICD-10-CM

## 2015-11-23 LAB — ECHOCARDIOGRAM COMPLETE
A4CEF: 64 %
AORTIC ROOT 2D: 36 mm
AV Mean grad: 14 mmHg
AV vel: 1.84
AVPG: 31 mmHg
AVPHT: 239 ms
Ao pk vel: 0.57 m/s
Area-P 1/2: 2.29 cm2
CHL CUP AV PEAK INDEX: 0.67
CHL CUP LA VOL 2D: 85 mL
CHL CUP LVOT MV VTI: 2.32
CHL CUP MV DEC (S): 215 ms
CHL CUP MV M VEL: 82.3
DOP CAL AO MEAN VELOCITY: 170 cm/s
EERAT: 11.73
EWDT: 215 ms
FS: 32 % (ref 28–44)
IVS/LV PW RATIO, ED: 1.19
LA SIZE INDEX: 2.41 mm/m2
LA VOL 2D INDEX: 39.4 mL/m2
LA diam end sys: 52 mm
LA vol index: 37 mL/m2
LA vol: 80 mL
LASIZE: 52 mm
LV TDI E'LATERAL: 9.21 cm/s
LV TDI E'MEDIAL: 5.37 cm/s
LVIDD: 37.5 mm — AB (ref 3.5–6.0)
LVIDS: 25.5 mm — AB (ref 2.1–4.0)
LVOT MEAN VEL: 115 cm/s
LVOT SV INDEX: 44 mL/m2
LVOT SV: 96 mL
LVOT VTI: 37.9 cm
LVOT area: 2.54 cm2
LVOT peak grad rest: 10 mmHg
LVOT peak vel: 158 cm/s
LVOTD: 18 mm
LVOTVTI: 0.73 cm
MV pk A vel: 119 cm/s
MV pk E vel: 108 cm/s
MVG: 3 mmHg
MVPG: 5 mmHg
MVSPHT: 96 ms
PW: 13.4 mm — AB (ref 0.6–1.1)
PWSYS: 13.4 mm
TR max vel: 267 cm/s
TV PEAK RV-RA GRADIENT: 29 cm/s
VTI: 52.2 cm
Valve area index: 0.85
Valve area: 1.84 cm2

## 2015-11-25 ENCOUNTER — Other Ambulatory Visit: Payer: Self-pay | Admitting: *Deleted

## 2015-11-25 DIAGNOSIS — I351 Nonrheumatic aortic (valve) insufficiency: Secondary | ICD-10-CM

## 2016-02-07 LAB — CUP PACEART INCLINIC DEVICE CHECK
Brady Statistic AP VP Percent: 0.03 %
Brady Statistic AS VP Percent: 0.01 %
Brady Statistic RA Percent Paced: 8.87 %
Date Time Interrogation Session: 20170221152131
Implantable Lead Implant Date: 20111110
Implantable Lead Model: 5086
Lead Channel Impedance Value: 464 Ohm
Lead Channel Sensing Intrinsic Amplitude: 1.984 mV
Lead Channel Setting Pacing Amplitude: 2.5 V
Lead Channel Setting Pacing Pulse Width: 0.4 ms
Lead Channel Setting Sensing Sensitivity: 0.6 mV
MDC IDC LEAD IMPLANT DT: 20111110
MDC IDC LEAD LOCATION: 753859
MDC IDC LEAD LOCATION: 753860
MDC IDC LEAD MODEL: 5086
MDC IDC MSMT BATTERY VOLTAGE: 2.99 V
MDC IDC MSMT LEADCHNL RA IMPEDANCE VALUE: 432 Ohm
MDC IDC MSMT LEADCHNL RV SENSING INTR AMPL: 3.07 mV
MDC IDC SET LEADCHNL RA PACING AMPLITUDE: 2 V
MDC IDC STAT BRADY AP VS PERCENT: 8.83 %
MDC IDC STAT BRADY AS VS PERCENT: 91.12 %
MDC IDC STAT BRADY RV PERCENT PACED: 0.04 %

## 2016-02-09 ENCOUNTER — Ambulatory Visit (INDEPENDENT_AMBULATORY_CARE_PROVIDER_SITE_OTHER): Payer: Medicare Other | Admitting: *Deleted

## 2016-02-09 DIAGNOSIS — I441 Atrioventricular block, second degree: Secondary | ICD-10-CM | POA: Diagnosis not present

## 2016-02-09 NOTE — Progress Notes (Signed)
Remote pacemaker transmission.   

## 2016-02-24 LAB — CUP PACEART REMOTE DEVICE CHECK
Battery Voltage: 2.97 V
Brady Statistic AP VP Percent: 0.1 %
Brady Statistic AS VS Percent: 87.11 %
Implantable Lead Implant Date: 20111110
Implantable Lead Location: 753859
Lead Channel Impedance Value: 472 Ohm
Lead Channel Sensing Intrinsic Amplitude: 2.07 mV
Lead Channel Setting Pacing Pulse Width: 0.4 ms
Lead Channel Setting Sensing Sensitivity: 0.6 mV
MDC IDC LEAD IMPLANT DT: 20111110
MDC IDC LEAD LOCATION: 753860
MDC IDC LEAD MODEL: 5086
MDC IDC LEAD MODEL: 5086
MDC IDC MSMT LEADCHNL RA IMPEDANCE VALUE: 520 Ohm
MDC IDC MSMT LEADCHNL RV SENSING INTR AMPL: 4.093 mV
MDC IDC SESS DTM: 20170531165010
MDC IDC SET LEADCHNL RA PACING AMPLITUDE: 2 V
MDC IDC SET LEADCHNL RV PACING AMPLITUDE: 2.5 V
MDC IDC STAT BRADY AP VS PERCENT: 12.78 %
MDC IDC STAT BRADY AS VP PERCENT: 0.01 %
MDC IDC STAT BRADY RA PERCENT PACED: 12.88 %
MDC IDC STAT BRADY RV PERCENT PACED: 0.11 %

## 2016-03-01 ENCOUNTER — Encounter: Payer: Self-pay | Admitting: Cardiology

## 2016-05-10 ENCOUNTER — Ambulatory Visit (INDEPENDENT_AMBULATORY_CARE_PROVIDER_SITE_OTHER): Payer: Medicare Other | Admitting: *Deleted

## 2016-05-10 DIAGNOSIS — I441 Atrioventricular block, second degree: Secondary | ICD-10-CM

## 2016-05-10 NOTE — Progress Notes (Signed)
Remote pacemaker transmission.   

## 2016-05-12 ENCOUNTER — Encounter: Payer: Self-pay | Admitting: Cardiology

## 2016-05-17 LAB — CUP PACEART REMOTE DEVICE CHECK
Battery Voltage: 2.97 V
Brady Statistic AS VP Percent: 0.06 %
Brady Statistic RA Percent Paced: 13.12 %
Brady Statistic RV Percent Paced: 0.27 %
Date Time Interrogation Session: 20170830132920
Implantable Lead Implant Date: 20111110
Implantable Lead Location: 753859
Implantable Lead Model: 5086
Lead Channel Impedance Value: 440 Ohm
Lead Channel Sensing Intrinsic Amplitude: 2.729 mV
Lead Channel Setting Pacing Amplitude: 2 V
Lead Channel Setting Pacing Amplitude: 2.5 V
Lead Channel Setting Sensing Sensitivity: 0.6 mV
MDC IDC LEAD IMPLANT DT: 20111110
MDC IDC LEAD LOCATION: 753860
MDC IDC LEAD MODEL: 5086
MDC IDC MSMT LEADCHNL RA IMPEDANCE VALUE: 456 Ohm
MDC IDC MSMT LEADCHNL RA SENSING INTR AMPL: 2.07 mV
MDC IDC SET LEADCHNL RV PACING PULSEWIDTH: 0.4 ms
MDC IDC STAT BRADY AP VP PERCENT: 0.22 %
MDC IDC STAT BRADY AP VS PERCENT: 12.91 %
MDC IDC STAT BRADY AS VS PERCENT: 86.82 %

## 2016-08-09 ENCOUNTER — Telehealth: Payer: Self-pay | Admitting: Cardiology

## 2016-08-09 ENCOUNTER — Ambulatory Visit (INDEPENDENT_AMBULATORY_CARE_PROVIDER_SITE_OTHER): Payer: Medicare Other | Admitting: *Deleted

## 2016-08-09 DIAGNOSIS — I441 Atrioventricular block, second degree: Secondary | ICD-10-CM

## 2016-08-09 NOTE — Progress Notes (Signed)
Remote pacemaker transmission.   

## 2016-08-09 NOTE — Telephone Encounter (Signed)
LMOVM reminding pt to send remote transmission.   

## 2016-08-18 ENCOUNTER — Encounter: Payer: Self-pay | Admitting: Cardiology

## 2016-08-22 ENCOUNTER — Encounter: Payer: Self-pay | Admitting: Cardiology

## 2016-09-12 LAB — CUP PACEART REMOTE DEVICE CHECK
Battery Voltage: 2.97 V
Brady Statistic AS VS Percent: 88.76 %
Brady Statistic RV Percent Paced: 0.4 %
Date Time Interrogation Session: 20171129201157
Implantable Lead Implant Date: 20111110
Implantable Lead Location: 753859
Implantable Lead Model: 5086
Implantable Lead Model: 5086
Implantable Pulse Generator Implant Date: 20111110
Lead Channel Sensing Intrinsic Amplitude: 1.897 mV
Lead Channel Setting Pacing Amplitude: 2 V
Lead Channel Setting Pacing Pulse Width: 0.4 ms
MDC IDC LEAD IMPLANT DT: 20111110
MDC IDC LEAD LOCATION: 753860
MDC IDC MSMT LEADCHNL RA IMPEDANCE VALUE: 424 Ohm
MDC IDC MSMT LEADCHNL RV IMPEDANCE VALUE: 472 Ohm
MDC IDC MSMT LEADCHNL RV SENSING INTR AMPL: 3.07 mV
MDC IDC SET LEADCHNL RV PACING AMPLITUDE: 2.5 V
MDC IDC SET LEADCHNL RV SENSING SENSITIVITY: 0.6 mV
MDC IDC STAT BRADY AP VP PERCENT: 0.29 %
MDC IDC STAT BRADY AP VS PERCENT: 10.85 %
MDC IDC STAT BRADY AS VP PERCENT: 0.11 %
MDC IDC STAT BRADY RA PERCENT PACED: 11.04 %

## 2016-10-17 ENCOUNTER — Other Ambulatory Visit: Payer: Self-pay | Admitting: Cardiovascular Disease

## 2016-10-18 NOTE — Telephone Encounter (Signed)
Rx(s) sent to pharmacy electronically.  

## 2016-10-25 DIAGNOSIS — M545 Low back pain, unspecified: Secondary | ICD-10-CM | POA: Insufficient documentation

## 2016-11-24 ENCOUNTER — Telehealth (HOSPITAL_COMMUNITY): Payer: Self-pay | Admitting: Radiology

## 2016-11-24 NOTE — Telephone Encounter (Signed)
Called to schedule echocardiogram 

## 2016-11-28 ENCOUNTER — Ambulatory Visit (INDEPENDENT_AMBULATORY_CARE_PROVIDER_SITE_OTHER): Payer: Medicare Other | Admitting: Cardiovascular Disease

## 2016-11-28 ENCOUNTER — Encounter: Payer: Self-pay | Admitting: Cardiovascular Disease

## 2016-11-28 VITALS — BP 130/76 | HR 66 | Ht 71.0 in | Wt 212.0 lb

## 2016-11-28 DIAGNOSIS — Z950 Presence of cardiac pacemaker: Secondary | ICD-10-CM | POA: Diagnosis not present

## 2016-11-28 DIAGNOSIS — I441 Atrioventricular block, second degree: Secondary | ICD-10-CM

## 2016-11-28 DIAGNOSIS — E782 Mixed hyperlipidemia: Secondary | ICD-10-CM

## 2016-11-28 DIAGNOSIS — Z79899 Other long term (current) drug therapy: Secondary | ICD-10-CM | POA: Diagnosis not present

## 2016-11-28 DIAGNOSIS — E119 Type 2 diabetes mellitus without complications: Secondary | ICD-10-CM | POA: Diagnosis not present

## 2016-11-28 DIAGNOSIS — I4891 Unspecified atrial fibrillation: Secondary | ICD-10-CM

## 2016-11-28 DIAGNOSIS — I10 Essential (primary) hypertension: Secondary | ICD-10-CM | POA: Diagnosis not present

## 2016-11-28 DIAGNOSIS — I9789 Other postprocedural complications and disorders of the circulatory system, not elsewhere classified: Secondary | ICD-10-CM | POA: Diagnosis not present

## 2016-11-28 DIAGNOSIS — Z952 Presence of prosthetic heart valve: Secondary | ICD-10-CM | POA: Diagnosis not present

## 2016-11-28 LAB — COMPREHENSIVE METABOLIC PANEL
ALK PHOS: 53 U/L (ref 40–115)
ALT: 20 U/L (ref 9–46)
AST: 22 U/L (ref 10–35)
Albumin: 4.3 g/dL (ref 3.6–5.1)
BUN: 16 mg/dL (ref 7–25)
CO2: 26 mmol/L (ref 20–31)
Calcium: 9.5 mg/dL (ref 8.6–10.3)
Chloride: 105 mmol/L (ref 98–110)
Creat: 1.02 mg/dL (ref 0.70–1.18)
GLUCOSE: 91 mg/dL (ref 65–99)
POTASSIUM: 4.7 mmol/L (ref 3.5–5.3)
Sodium: 139 mmol/L (ref 135–146)
Total Bilirubin: 0.9 mg/dL (ref 0.2–1.2)
Total Protein: 6.8 g/dL (ref 6.1–8.1)

## 2016-11-28 LAB — LIPID PANEL
CHOL/HDL RATIO: 4.3 ratio (ref <5.0)
Cholesterol: 129 mg/dL (ref <200)
HDL: 30 mg/dL — ABNORMAL LOW (ref >40)
LDL Cholesterol: 76 mg/dL (ref <100)
Triglycerides: 113 mg/dL (ref <150)
VLDL: 23 mg/dL (ref <30)

## 2016-11-28 NOTE — Progress Notes (Signed)
Patient ID: [name] [name], male   DOB: [birth year], 71 y.o.   MRN: [id number]    Cardiology Office Note    Date:  11/28/2016   ID:  [name], DOB 21-Dec-1945, MRN [id number]  PCP:  No PCP Per Patient  Cardiologist:   Sanda Klein, MD   No chief complaint on file.   History of Present Illness:  [name] [name] is a 71 y.o. male who presents for follow-up for valvular heart disease and AV node conduction abnormalities with pacemaker implantation. He underwent aortic valve replacement in January 2012 (21 mm bioprosthesis) for symptomatic aortic stenosis. Early on, his prosthesis showed evidence of mild to moderate aortic insufficiency that has not progressed over time. He has preserved left ventricular systolic function. In November 2011 she received a dual-chamber permanent pacemaker for syncope that was likely related to second degree atrioventricular block. He has an MRI conditional Medtronic Revo device. 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  [name] [name] feels well, but continues to struggle to avoid obesity. He had gained as much as 12 pounds before this appointment but over the last few weeks has started exercising again and has essentially the same weight as he did 1 year ago. He is seeing [name] for endocrinology follow-up in [place]. He has not seen him since last May when he had labs performed.[name] [name] He denies exertional angina or dyspnea, palpitations, leg edema, focal neurological complaints, syncope or intermittent claudication. He is still active roughly 2.6 hours per day similar to his historical trend.  Pacemaker interrogation shows only 11% atrial pacing and less than 0.3% ventricular pacing. Battery voltage is 2.97 V (recommended replacement at 2.81 V).    Past Medical History:  Diagnosis Date  . Aortic insufficiency   . Atrial fibrillation ([place])   . AV block, 2nd degree   . Diabetes ([place])   .  Dyslipidemia   . Hypertension   . LAFB (left anterior fascicular block)   . Mitral insufficiency   . RBBB   . Ventricular tachycardia ([place])   . Ventricular tachycardia, non-sustained [facility])     Past Surgical History:  Procedure Laterality Date  . AORTIC VALVE REPLACEMENT  10/06/2010   [facility] Ease prosthesis  . BACK SURGERY    . CARDIAC CATHETERIZATION  06/30/2010   nonobstructive CAD, heavily ca+ AOV  . NM MYOCAR PERF WALL MOTION  02/28/2007   normal  . PERMANENT PACEMAKER INSERTION  07/21/2010   Medtronic    Outpatient Medications Prior to Visit  Medication Sig Dispense Refill  . amLODipine (NORVASC) 5 MG tablet TAKE 1 TABLET BY MOUTH DAILY. 90 tablet 3  . aspirin EC 81 MG tablet Take 81 mg by mouth daily.    [name] [name] atorvastatin (LIPITOR) 40 MG tablet TAKE 1 TABLET BY MOUTH DAILY 90 tablet 1  . glipiZIDE-metformin (METAGLIP) 5-500 MG per tablet Take 1 tablet by mouth 2 (two) times daily before a meal. 1/2 tab in the morning, 1 tab at night.    . irbesartan (AVAPRO) 300 MG tablet TAKE 1 TABLET BY MOUTH DAILY 90 tablet 1  . metoprolol (LOPRESSOR) 50 MG tablet Take 1 tablet (50 mg total) by mouth 2 (two) times daily. 180 tablet 3   No facility-administered medications prior to visit.      Allergies:   Patient has no known allergies.   Social History   Social History  . Marital status: Single    Spouse name: N/A  .  Number of children: N/A  . Years of education: N/A   Social History Main Topics  . Smoking status: Never Smoker  . Smokeless tobacco: Never Used  . Alcohol use No  . Drug use: No  . Sexual activity: Not Asked   Other Topics Concern  . None   Social History Narrative  . None     Family History:  The patient's brother recently underwent prostatectomy for prostate cancer  ROS:   Please see the history of present illness.    ROS All other systems reviewed and are negative.   PHYSICAL EXAM:   VS:  BP 130/76   Pulse 66   Ht 5\' 11"  (1.803 m)   Wt  96.2 kg (212 lb)   BMI 29.57 kg/m    GEN: Well nourished, well developed, in no acute distress  HEENT: normal  Neck: no JVD, carotid bruits, or masses Cardiac: RRR, widely split second heart sound; grade 2/6 early peaking aortic ejection systolic more murmur, grade 1/6 decrescendo aortic insufficiency murmur at the right lower sternal border, rubs, or gallops,no edema , healthy left subclavian pacemaker site Respiratory:  clear to auscultation bilaterally, normal work of breathing GI: soft, nontender, nondistended, + BS MS: no deformity or atrophy  Skin: warm and dry, no rash Neuro:  Alert and Oriented x 3, Strength and sensation are intact Psych: euthymic mood, full affect  Wt Readings from Last 3 Encounters:  11/28/16 96.2 kg (212 lb)  11/02/15 96.4 kg (212 lb 7 oz)  10/13/14 96.7 kg (213 lb 1.6 oz)      Studies/Labs Reviewed:   EKG:  EKG is ordered today.  The ekg ordered today demonstrates normal sinus rhythm with Mild first-degree atrioventricular block at 220 ms, right bundle branch block, left anterior fascicular block, QTC 457 ms  Recent Labs: May 2017 Glucose 126, TSH 3.79, creatinine 0.96, normal liver function tests, hemoglobin 17.3 Total cholesterol 134, triglycerides 120, LDL 84, HDL 34   Lipid Panel    Component Value Date/Time   CHOL 113 (L) 11/02/2015 1133   TRIG 97 11/02/2015 1133   HDL 25 (L) 11/02/2015 1133   CHOLHDL 4.5 11/02/2015 1133   VLDL 19 11/02/2015 1133   LDLCALC 69 11/02/2015 1133     ASSESSMENT:    1. Postoperative atrial fibrillation ([place])   2. Second degree AV block, Mobitz type I    3. Pacemaker- Medtronic Revo, Nov 2011   4. 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 [name] 09/2010   5. Essential hypertension   6. Type 2 diabetes mellitus without complication, without long-term current use of insulin (HCC)   7. Mixed hyperlipidemia   8. Medication management      PLAN:  In order of problems listed  above:   1. PAF: No recurrence of atrial fibrillation since the immediate postop period; not receiving anticoagulants 2. Second degree AV block with evidence of infrahisian disease, but with very infrequent ventricular pacing 3. PPM: Normal device function by check today; continue CareLink remote downloads every 3 months 4. S/P AVR with unexpected degree of aortic insufficiency, not hemodynamically significant at this point and without evidence of progression on previous echoes. Repeat echo. Already scheduled for tomorrow. 5. HTN: Blood pressure within target range of less than 130/80. 6. DM: Time to recheck hemoglobin A1c. 7. HLP: Excellent lipid profile last year except for low HDL, weight loss would be helpful. He plans to start exercising on a more regular basis again  Medication Adjustments/Labs and Tests Ordered: Current medicines are reviewed at length with the patient today.  Concerns regarding medicines are outlined above.  Medication changes, Labs and Tests ordered today are listed in the Patient Instructions below. Patient Instructions  Dr Sallyanne Kuster recommends that you continue on your current medications as directed. Please refer to the Current Medication list given to you today.  Your physician recommends that you return for lab work TODAY.  Remote monitoring is used to monitor your Pacemaker of ICD from home. This monitoring reduces the number of office visits required to check your device to one time per year. It allows [place] to keep an eye on the functioning of your device to ensure it is working properly. You are scheduled for a device check from home on Tuesday, June 19th, 2018. You may send your transmission at any time that day. If you have a wireless device, the transmission will be sent automatically. After your physician reviews your transmission, you will receive a postcard with your next transmission date.  Dr Sallyanne Kuster recommends that you schedule a follow-up appointment  in 12 months with a pacemaker check. You will receive a reminder letter in the mail two months in advance. If you don't receive a letter, please call our office to schedule the follow-up appointment.  If you need a refill on your cardiac medications before your next appointment, please call your pharmacy.      Signed, Sanda Klein, MD  11/28/2016 8:48 AM    [facility] [place], [place], [place]  [address] Phone: [phone]; Fax: [phone]

## 2016-11-28 NOTE — Patient Instructions (Signed)
Dr Sallyanne Kuster recommends that you continue on your current medications as directed. Please refer to the Current Medication list given to you today.  Your physician recommends that you return for lab work TODAY.  Remote monitoring is used to monitor your Pacemaker of ICD from home. This monitoring reduces the number of office visits required to check your device to one time per year. It allows [place] to keep an eye on the functioning of your device to ensure it is working properly. You are scheduled for a device check from home on Tuesday, June 19th, 2018. You may send your transmission at any time that day. If you have a wireless device, the transmission will be sent automatically. After your physician reviews your transmission, you will receive a postcard with your next transmission date.  Dr Sallyanne Kuster recommends that you schedule a follow-up appointment in 12 months with a pacemaker check. You will receive a reminder letter in the mail two months in advance. If you don't receive a letter, please call our office to schedule the follow-up appointment.  If you need a refill on your cardiac medications before your next appointment, please call your pharmacy.

## 2016-11-29 ENCOUNTER — Ambulatory Visit (HOSPITAL_COMMUNITY): Payer: Medicare Other | Attending: Internal Medicine

## 2016-11-29 ENCOUNTER — Other Ambulatory Visit: Payer: Self-pay

## 2016-11-29 DIAGNOSIS — I340 Nonrheumatic mitral (valve) insufficiency: Secondary | ICD-10-CM | POA: Insufficient documentation

## 2016-11-29 DIAGNOSIS — I361 Nonrheumatic tricuspid (valve) insufficiency: Secondary | ICD-10-CM | POA: Insufficient documentation

## 2016-11-29 DIAGNOSIS — E119 Type 2 diabetes mellitus without complications: Secondary | ICD-10-CM | POA: Diagnosis not present

## 2016-11-29 DIAGNOSIS — I77810 Thoracic aortic ectasia: Secondary | ICD-10-CM | POA: Diagnosis not present

## 2016-11-29 DIAGNOSIS — I359 Nonrheumatic aortic valve disorder, unspecified: Secondary | ICD-10-CM | POA: Diagnosis present

## 2016-11-29 DIAGNOSIS — I351 Nonrheumatic aortic (valve) insufficiency: Secondary | ICD-10-CM | POA: Insufficient documentation

## 2016-11-29 DIAGNOSIS — I119 Hypertensive heart disease without heart failure: Secondary | ICD-10-CM | POA: Diagnosis not present

## 2016-11-29 DIAGNOSIS — Z953 Presence of xenogenic heart valve: Secondary | ICD-10-CM | POA: Diagnosis not present

## 2016-11-29 LAB — HEMOGLOBIN A1C
HEMOGLOBIN A1C: 5.7 % — AB (ref <5.7)
MEAN PLASMA GLUCOSE: 117 mg/dL

## 2016-11-29 MED ORDER — PERFLUTREN LIPID MICROSPHERE
1.0000 mL | INTRAVENOUS | Status: AC | PRN
  Administered 2016-11-29: 1 mL via INTRAVENOUS

## 2016-11-30 LAB — CUP PACEART REMOTE DEVICE CHECK
Battery Voltage: 2.97 V
Brady Statistic AP VP Percent: 0.18 %
Brady Statistic RA Percent Paced: 11.39 %
Brady Statistic RV Percent Paced: 0.24 %
Implantable Lead Implant Date: 20111110
Implantable Lead Location: 753859
Implantable Pulse Generator Implant Date: 20111110
Lead Channel Sensing Intrinsic Amplitude: 1.984 mV
Lead Channel Setting Pacing Amplitude: 2 V
Lead Channel Setting Pacing Pulse Width: 0.4 ms
Lead Channel Setting Sensing Sensitivity: 0.6 mV
MDC IDC LEAD IMPLANT DT: 20111110
MDC IDC LEAD LOCATION: 753860
MDC IDC MSMT LEADCHNL RA IMPEDANCE VALUE: 408 Ohm
MDC IDC MSMT LEADCHNL RV IMPEDANCE VALUE: 472 Ohm
MDC IDC MSMT LEADCHNL RV SENSING INTR AMPL: 3.07 mV
MDC IDC SESS DTM: 20180320112812
MDC IDC SET LEADCHNL RV PACING AMPLITUDE: 2.5 V
MDC IDC STAT BRADY AP VS PERCENT: 11.28 %
MDC IDC STAT BRADY AS VP PERCENT: 0.05 %
MDC IDC STAT BRADY AS VS PERCENT: 88.49 %

## 2016-12-11 ENCOUNTER — Other Ambulatory Visit: Payer: Self-pay | Admitting: Cardiovascular Disease

## 2016-12-21 DIAGNOSIS — H2513 Age-related nuclear cataract, bilateral: Secondary | ICD-10-CM | POA: Insufficient documentation

## 2017-01-04 ENCOUNTER — Other Ambulatory Visit: Payer: Self-pay | Admitting: Cardiovascular Disease

## 2017-01-04 NOTE — Telephone Encounter (Signed)
Rx(s) sent to pharmacy electronically.  

## 2017-02-27 ENCOUNTER — Ambulatory Visit (INDEPENDENT_AMBULATORY_CARE_PROVIDER_SITE_OTHER): Payer: Medicare Other | Admitting: *Deleted

## 2017-02-27 ENCOUNTER — Telehealth: Payer: Self-pay | Admitting: Cardiology

## 2017-02-27 DIAGNOSIS — I441 Atrioventricular block, second degree: Secondary | ICD-10-CM

## 2017-02-27 NOTE — Telephone Encounter (Signed)
LMOVM reminding pt to send remote transmission.   

## 2017-03-01 LAB — CUP PACEART REMOTE DEVICE CHECK
Battery Voltage: 2.96 V
Brady Statistic AP VP Percent: 0.26 %
Brady Statistic AP VS Percent: 11.3 %
Brady Statistic AS VP Percent: 0.04 %
Brady Statistic AS VS Percent: 88.4 %
Brady Statistic RA Percent Paced: 11.52 %
Brady Statistic RV Percent Paced: 0.35 %
Date Time Interrogation Session: 20180619181208
Implantable Lead Implant Date: 20111110
Implantable Lead Implant Date: 20111110
Implantable Lead Location: 753859
Implantable Lead Location: 753860
Implantable Pulse Generator Implant Date: 20111110
Lead Channel Impedance Value: 392 Ohm
Lead Channel Impedance Value: 456 Ohm
Lead Channel Sensing Intrinsic Amplitude: 2.07 mV
Lead Channel Sensing Intrinsic Amplitude: 3.411 mV
Lead Channel Setting Pacing Amplitude: 2 V
Lead Channel Setting Pacing Amplitude: 2.5 V
Lead Channel Setting Pacing Pulse Width: 0.4 ms
Lead Channel Setting Sensing Sensitivity: 0.6 mV

## 2017-03-01 NOTE — Progress Notes (Signed)
Remote pacemaker transmission.   

## 2017-03-02 ENCOUNTER — Encounter: Payer: Self-pay | Admitting: Cardiology

## 2017-03-16 ENCOUNTER — Encounter: Payer: Self-pay | Admitting: Cardiology

## 2017-04-30 ENCOUNTER — Other Ambulatory Visit: Payer: Self-pay | Admitting: Cardiovascular Disease

## 2017-05-29 ENCOUNTER — Ambulatory Visit (INDEPENDENT_AMBULATORY_CARE_PROVIDER_SITE_OTHER): Payer: Medicare Other | Admitting: *Deleted

## 2017-05-29 ENCOUNTER — Telehealth: Payer: Self-pay | Admitting: Cardiology

## 2017-05-29 DIAGNOSIS — I441 Atrioventricular block, second degree: Secondary | ICD-10-CM | POA: Diagnosis not present

## 2017-05-29 NOTE — Progress Notes (Signed)
Remote pacemaker transmission.   

## 2017-05-29 NOTE — Telephone Encounter (Signed)
Spoke with pt and reminded pt of remote transmission that is due today. Pt verbalized understanding.   

## 2017-05-30 LAB — CUP PACEART REMOTE DEVICE CHECK
Battery Voltage: 2.96 V
Brady Statistic AP VS Percent: 14.8 %
Brady Statistic AS VP Percent: 0.02 %
Brady Statistic AS VS Percent: 85.02 %
Brady Statistic RA Percent Paced: 14.74 %
Brady Statistic RV Percent Paced: 0.2 %
Date Time Interrogation Session: 20180918150510
Implantable Lead Implant Date: 20111110
Implantable Lead Location: 753859
Implantable Lead Location: 753860
Lead Channel Setting Pacing Amplitude: 2 V
Lead Channel Setting Pacing Amplitude: 2.5 V
Lead Channel Setting Pacing Pulse Width: 0.4 ms
MDC IDC LEAD IMPLANT DT: 20111110
MDC IDC MSMT LEADCHNL RA IMPEDANCE VALUE: 392 Ohm
MDC IDC MSMT LEADCHNL RA SENSING INTR AMPL: 1.897 mV
MDC IDC MSMT LEADCHNL RV IMPEDANCE VALUE: 440 Ohm
MDC IDC MSMT LEADCHNL RV SENSING INTR AMPL: 2.388 mV
MDC IDC PG IMPLANT DT: 20111110
MDC IDC SET LEADCHNL RV SENSING SENSITIVITY: 0.6 mV
MDC IDC STAT BRADY AP VP PERCENT: 0.15 %

## 2017-05-31 ENCOUNTER — Encounter: Payer: Self-pay | Admitting: Cardiology

## 2017-07-24 ENCOUNTER — Other Ambulatory Visit: Payer: Self-pay | Admitting: *Deleted

## 2017-07-24 MED ORDER — METOPROLOL TARTRATE 50 MG PO TABS: 50.0000 mg | ORAL_TABLET | Freq: Two times a day (BID) | ORAL | 3 refills | Status: DC

## 2017-07-24 MED ORDER — AMLODIPINE BESYLATE 5 MG PO TABS: 5.0000 mg | ORAL_TABLET | Freq: Every day | ORAL | 3 refills | Status: DC

## 2017-07-24 MED ORDER — ATORVASTATIN CALCIUM 40 MG PO TABS: 40.0000 mg | ORAL_TABLET | Freq: Every day | ORAL | 3 refills | Status: DC

## 2017-07-27 ENCOUNTER — Telehealth: Payer: Self-pay | Admitting: Cardiovascular Disease

## 2017-07-27 MED ORDER — METOPROLOL TARTRATE 50 MG PO TABS: 50.0000 mg | ORAL_TABLET | Freq: Two times a day (BID) | ORAL | 3 refills | Status: DC

## 2017-07-27 MED ORDER — IRBESARTAN 300 MG PO TABS: 300.0000 mg | ORAL_TABLET | Freq: Every day | ORAL | 1 refills | Status: DC

## 2017-07-27 MED ORDER — ATORVASTATIN CALCIUM 40 MG PO TABS: 40.0000 mg | ORAL_TABLET | Freq: Every day | ORAL | 3 refills | Status: DC

## 2017-07-27 MED ORDER — AMLODIPINE BESYLATE 5 MG PO TABS: 5.0000 mg | ORAL_TABLET | Freq: Every day | ORAL | 3 refills | Status: DC

## 2017-07-27 NOTE — Telephone Encounter (Signed)
Pt says he needs you to call [facility] to authorize for him to get his medicine through Home [facility].Please [phone].Please call pt so he can you full details on what he needs.

## 2017-07-27 NOTE — Telephone Encounter (Signed)
Rx sent as requested.

## 2017-08-28 ENCOUNTER — Telehealth: Payer: Self-pay | Admitting: Cardiology

## 2017-08-28 ENCOUNTER — Ambulatory Visit (INDEPENDENT_AMBULATORY_CARE_PROVIDER_SITE_OTHER): Payer: Medicare Other | Admitting: *Deleted

## 2017-08-28 DIAGNOSIS — I441 Atrioventricular block, second degree: Secondary | ICD-10-CM

## 2017-08-28 NOTE — Telephone Encounter (Signed)
Confirmed remote transmission w/ pt.   

## 2017-08-29 ENCOUNTER — Encounter: Payer: Self-pay | Admitting: Cardiology

## 2017-08-29 NOTE — Progress Notes (Signed)
Remote pacemaker transmission.   

## 2017-08-30 LAB — CUP PACEART REMOTE DEVICE CHECK
Brady Statistic AP VP Percent: 0.13 %
Brady Statistic AS VP Percent: 0.02 %
Brady Statistic AS VS Percent: 86.12 %
Implantable Lead Location: 753860
Implantable Pulse Generator Implant Date: 20111110
Lead Channel Impedance Value: 392 Ohm
Lead Channel Impedance Value: 440 Ohm
Lead Channel Sensing Intrinsic Amplitude: 2.113 mV
Lead Channel Setting Pacing Amplitude: 2.5 V
Lead Channel Setting Pacing Pulse Width: 0.4 ms
MDC IDC LEAD IMPLANT DT: 20111110
MDC IDC LEAD IMPLANT DT: 20111110
MDC IDC LEAD LOCATION: 753859
MDC IDC MSMT BATTERY VOLTAGE: 2.96 V
MDC IDC MSMT LEADCHNL RV SENSING INTR AMPL: 2.388 mV
MDC IDC SESS DTM: 20181218201528
MDC IDC SET LEADCHNL RA PACING AMPLITUDE: 2 V
MDC IDC SET LEADCHNL RV SENSING SENSITIVITY: 0.6 mV
MDC IDC STAT BRADY AP VS PERCENT: 13.72 %
MDC IDC STAT BRADY RA PERCENT PACED: 13.66 %
MDC IDC STAT BRADY RV PERCENT PACED: 0.18 %

## 2017-11-27 ENCOUNTER — Ambulatory Visit (INDEPENDENT_AMBULATORY_CARE_PROVIDER_SITE_OTHER): Payer: Medicare Other | Admitting: *Deleted

## 2017-11-27 DIAGNOSIS — I441 Atrioventricular block, second degree: Secondary | ICD-10-CM

## 2017-11-27 NOTE — Progress Notes (Signed)
Remote pacemaker transmission.   

## 2017-11-28 ENCOUNTER — Encounter: Payer: Self-pay | Admitting: Cardiology

## 2017-12-14 LAB — CUP PACEART REMOTE DEVICE CHECK
Battery Voltage: 2.95 V
Brady Statistic AP VP Percent: 0.18 %
Brady Statistic AP VS Percent: 16.27 %
Brady Statistic AS VS Percent: 83.52 %
Brady Statistic RA Percent Paced: 16.16 %
Brady Statistic RV Percent Paced: 0.25 %
Implantable Lead Implant Date: 20111110
Implantable Lead Location: 753860
Lead Channel Impedance Value: 384 Ohm
Lead Channel Sensing Intrinsic Amplitude: 1.94 mV
Lead Channel Setting Pacing Amplitude: 2 V
Lead Channel Setting Pacing Pulse Width: 0.4 ms
Lead Channel Setting Sensing Sensitivity: 0.6 mV
MDC IDC LEAD IMPLANT DT: 20111110
MDC IDC LEAD LOCATION: 753859
MDC IDC MSMT LEADCHNL RV IMPEDANCE VALUE: 456 Ohm
MDC IDC MSMT LEADCHNL RV SENSING INTR AMPL: 2.729 mV
MDC IDC PG IMPLANT DT: 20111110
MDC IDC SESS DTM: 20190319142444
MDC IDC SET LEADCHNL RV PACING AMPLITUDE: 2.5 V
MDC IDC STAT BRADY AS VP PERCENT: 0.03 %

## 2017-12-25 DIAGNOSIS — H18413 Arcus senilis, bilateral: Secondary | ICD-10-CM | POA: Insufficient documentation

## 2017-12-25 DIAGNOSIS — H35033 Hypertensive retinopathy, bilateral: Secondary | ICD-10-CM | POA: Insufficient documentation

## 2018-02-08 ENCOUNTER — Telehealth: Payer: Self-pay

## 2018-02-08 MED ORDER — LOSARTAN POTASSIUM 100 MG PO TABS: 100.0000 mg | ORAL_TABLET | Freq: Every day | ORAL | 3 refills | Status: DC

## 2018-02-08 NOTE — Telephone Encounter (Signed)
Pt walked in to let us know that he tried to refill Irbesartan and the pharmacist @ [facility] says it is no longer available and will need a replacement rx for this, Please advise

## 2018-02-08 NOTE — Telephone Encounter (Signed)
Losartan 100 mg daily please MCr 

## 2018-02-26 ENCOUNTER — Ambulatory Visit (INDEPENDENT_AMBULATORY_CARE_PROVIDER_SITE_OTHER): Payer: Medicare Other | Admitting: *Deleted

## 2018-02-26 DIAGNOSIS — I441 Atrioventricular block, second degree: Secondary | ICD-10-CM | POA: Diagnosis not present

## 2018-02-26 NOTE — Progress Notes (Signed)
Remote pacemaker transmission.   

## 2018-03-01 LAB — CUP PACEART REMOTE DEVICE CHECK
Brady Statistic AP VS Percent: 15.92 %
Brady Statistic AS VS Percent: 83.87 %
Brady Statistic RV Percent Paced: 0.22 %
Implantable Lead Implant Date: 20111110
Lead Channel Impedance Value: 384 Ohm
Lead Channel Impedance Value: 448 Ohm
Lead Channel Sensing Intrinsic Amplitude: 2.113 mV
Lead Channel Sensing Intrinsic Amplitude: 3.411 mV
Lead Channel Setting Pacing Amplitude: 2 V
MDC IDC LEAD IMPLANT DT: 20111110
MDC IDC LEAD LOCATION: 753859
MDC IDC LEAD LOCATION: 753860
MDC IDC MSMT BATTERY VOLTAGE: 2.95 V
MDC IDC PG IMPLANT DT: 20111110
MDC IDC SESS DTM: 20190618131331
MDC IDC SET LEADCHNL RV PACING AMPLITUDE: 2.5 V
MDC IDC SET LEADCHNL RV PACING PULSEWIDTH: 0.4 ms
MDC IDC SET LEADCHNL RV SENSING SENSITIVITY: 0.6 mV
MDC IDC STAT BRADY AP VP PERCENT: 0.18 %
MDC IDC STAT BRADY AS VP PERCENT: 0.02 %
MDC IDC STAT BRADY RA PERCENT PACED: 16.01 %

## 2018-03-08 ENCOUNTER — Encounter: Payer: Self-pay | Admitting: *Deleted

## 2018-03-20 ENCOUNTER — Encounter: Payer: Medicare Other | Admitting: Cardiovascular Disease

## 2018-03-21 ENCOUNTER — Encounter: Payer: Self-pay | Admitting: Cardiovascular Disease

## 2018-03-21 ENCOUNTER — Ambulatory Visit (INDEPENDENT_AMBULATORY_CARE_PROVIDER_SITE_OTHER): Payer: Medicare Other | Admitting: Cardiovascular Disease

## 2018-03-21 VITALS — BP 130/64 | HR 66 | Ht 71.0 in | Wt 210.8 lb

## 2018-03-21 DIAGNOSIS — Z79899 Other long term (current) drug therapy: Secondary | ICD-10-CM

## 2018-03-21 DIAGNOSIS — E119 Type 2 diabetes mellitus without complications: Secondary | ICD-10-CM

## 2018-03-21 DIAGNOSIS — E785 Hyperlipidemia, unspecified: Secondary | ICD-10-CM | POA: Diagnosis not present

## 2018-03-21 DIAGNOSIS — I441 Atrioventricular block, second degree: Secondary | ICD-10-CM | POA: Diagnosis not present

## 2018-03-21 DIAGNOSIS — I480 Paroxysmal atrial fibrillation: Secondary | ICD-10-CM

## 2018-03-21 DIAGNOSIS — Z09 Encounter for follow-up examination after completed treatment for conditions other than malignant neoplasm: Secondary | ICD-10-CM | POA: Diagnosis not present

## 2018-03-21 DIAGNOSIS — I10 Essential (primary) hypertension: Secondary | ICD-10-CM | POA: Diagnosis not present

## 2018-03-21 DIAGNOSIS — Z953 Presence of xenogenic heart valve: Secondary | ICD-10-CM

## 2018-03-21 DIAGNOSIS — Z950 Presence of cardiac pacemaker: Secondary | ICD-10-CM

## 2018-03-21 NOTE — Patient Instructions (Signed)
Dr Sallyanne Kuster recommends that you continue on your current medications as directed. Please refer to the Current Medication list given to you today.  Your physician recommends that you return for lab work TODAY.  Remote monitoring is used to monitor your Pacemaker or ICD from home. This monitoring reduces the number of office visits required to check your device to one time per year. It allows [place] to keep an eye on the functioning of your device to ensure it is working properly. You are scheduled for a device check from home on Tuesday, September 17th, 2019. You may send your transmission at any time that day. If you have a wireless device, the transmission will be sent automatically. After your physician reviews your transmission, you will receive a notification with your next transmission date.  To improve our patient care and to more adequately follow your device, [facility] has decided, as a practice, to start following each patient four times a year with your home monitor. This means that you may experience a remote appointment that is close to an in-office appointment with your physician. Your insurance will apply at the same rate as other remote monitoring transmissions.  Dr Sallyanne Kuster recommends that you schedule a follow-up appointment in 12 months with a pacemaker check. You will receive a reminder letter in the mail two months in advance. If you don't receive a letter, please call our office to schedule the follow-up appointment.  If you need a refill on your cardiac medications before your next appointment, please call your pharmacy.

## 2018-03-21 NOTE — Progress Notes (Signed)
Patient ID: [name] [name], male   DOB: Sep 11, 1946, 71 y.o.   MRN: [id number]    Cardiology Office Note    Date:  03/21/2018   ID:  [name] [name], DOB 09/06/1946, MRN [id number]  PCP:  Patient, No Pcp Per  Cardiologist:   Sanda Klein, MD   Chief Complaint  Patient presents with  . Follow-up    Aortic valve replacement  . Follow-up    Pacemaker check    History of Present Illness:  [name] [name] is a 72 y.o. male who presents for follow-up for valvular heart disease and AV node conduction abnormalities with pacemaker implantation. He underwent aortic valve replacement in January 2012 (21 mm bioprosthesis) for symptomatic aortic stenosis. Early on, his prosthesis showed evidence of mild to moderate aortic insufficiency that has not progressed over time. He has preserved left ventricular systolic function. In November 2011 she received a dual-chamber permanent pacemaker for syncope that was likely related to second degree atrioventricular block. He has an MRI conditional Medtronic Revo device. 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  My feels.  He is exercising regularly.  He tries to lose weight by choosing healthy foods and applying portion control.  He is managed to stay just to meet the limits for obesity with a BMI of 29.  He is frustrated that he cannot lose additional weight but is doing his best to follow a healthy lifestyle.  The patient specifically denies any chest pain at rest exertion, dyspnea at rest or with exertion, orthopnea, paroxysmal nocturnal dyspnea, syncope, palpitations, focal neurological deficits, intermittent claudication, lower extremity edema, unexplained weight gain, cough, hemoptysis or wheezing.   He is seeing [name] for endocrinology follow-up in [place]. He reports that his last hemoglobin A1c was less than 7%.  Pacemaker interrogation shows only 16% atrial pacing and  less than 0.3% ventricular pacing. Battery voltage is 2.95 V (recommended replacement at 2.81 V).    Past Medical History:  Diagnosis Date  . Aortic insufficiency   . Atrial fibrillation ([place])   . AV block, 2nd degree   . Diabetes ([place])   . Dyslipidemia   . Hypertension   . LAFB (left anterior fascicular block)   . Mitral insufficiency   . RBBB   . Ventricular tachycardia ([place])   . Ventricular tachycardia, non-sustained [facility])     Past Surgical History:  Procedure Laterality Date  . AORTIC VALVE REPLACEMENT  10/06/2010   [facility] Ease prosthesis  . BACK SURGERY    . CARDIAC CATHETERIZATION  06/30/2010   nonobstructive CAD, heavily ca+ AOV  . NM MYOCAR PERF WALL MOTION  02/28/2007   normal  . PERMANENT PACEMAKER INSERTION  07/21/2010   Medtronic    Outpatient Medications Prior to Visit  Medication Sig Dispense Refill  . amLODipine (NORVASC) 5 MG tablet Take 1 tablet (5 mg total) daily by mouth. 90 tablet 3  . aspirin EC 81 MG tablet Take 81 mg by mouth daily.    [name] atorvastatin (LIPITOR) 40 MG tablet Take 1 tablet (40 mg total) daily by mouth. 90 tablet 3  . glipiZIDE-metformin (METAGLIP) 5-500 MG per tablet Take 1 tablet by mouth 2 (two) times daily before a meal. 1/2 tab in the morning, 1 tab at night.    . losartan (COZAAR) 100 MG tablet Take 1 tablet (100 mg total) by mouth daily. 30 tablet 3  . metoprolol tartrate (LOPRESSOR) 50 MG tablet Take 1 tablet (  50 mg total) 2 (two) times daily by mouth. 180 tablet 3   No facility-administered medications prior to visit.      Allergies:   Patient has no known allergies.   Social History   Socioeconomic History  . Marital status: Single    Spouse name: Not on file  . Number of children: Not on file  . Years of education: Not on file  . Highest education level: Not on file  Occupational History  . Not on file  Social Needs  . Financial resource strain: Not on file  . Food insecurity:    Worry: Not on file     Inability: Not on file  . Transportation needs:    Medical: Not on file    Non-medical: Not on file  Tobacco Use  . Smoking status: Never Smoker  . Smokeless tobacco: Never Used  Substance and Sexual Activity  . Alcohol use: No  . Drug use: No  . Sexual activity: Not on file  Lifestyle  . Physical activity:    Days per week: Not on file    Minutes per session: Not on file  . Stress: Not on file  Relationships  . Social connections:    Talks on phone: Not on file    Gets together: Not on file    Attends religious service: Not on file    Active member of club or organization: Not on file    Attends meetings of clubs or organizations: Not on file    Relationship status: Not on file  Other Topics Concern  . Not on file  Social History Narrative  . Not on file     Family History:  The patient's brother recently underwent prostatectomy for prostate cancer  ROS:   Please see the history of present illness.    ROS All other systems are reviewed and are negative  PHYSICAL EXAM:   VS:  BP 130/64   Pulse 66   Ht 5\' 11"  (1.803 m)   Wt 210 lb 12.8 oz (95.6 kg)   BMI 29.40 kg/m     General: Alert, oriented x3, no distress, overweight, healthy pacemaker site in the left subclavian area Head: no evidence of trauma, PERRL, EOMI, no exophtalmos or lid lag, no myxedema, no xanthelasma; normal ears, nose and oropharynx Neck: normal jugular venous pulsations and no hepatojugular reflux; brisk carotid pulses without delay and no carotid bruits Chest: clear to auscultation, no signs of consolidation by percussion or palpation, normal fremitus, symmetrical and full respiratory excursions Cardiovascular: RRR, widely split second heart sound; grade 2/6 early peaking aortic ejection systolic more murmur, grade 1/6 decrescendo aortic insufficiency murmur at the right lower sternal border, no rubs or gallops Abdomen: no tenderness or distention, no masses by palpation, no abnormal pulsatility or  arterial bruits, normal bowel sounds, no hepatosplenomegaly Extremities: no clubbing, cyanosis or edema; 2+ radial, ulnar and brachial pulses bilaterally; 2+ right femoral, posterior tibial and dorsalis pedis pulses; 2+ left femoral, posterior tibial and dorsalis pedis pulses; no subclavian or femoral bruits Neurological: grossly nonfocal Psych: Normal mood and affect   Wt Readings from Last 3 Encounters:  03/21/18 210 lb 12.8 oz (95.6 kg)  11/28/16 212 lb (96.2 kg)  11/02/15 212 lb 7 oz (96.4 kg)      Studies/Labs Reviewed:   EKG:  EKG is ordered today.  ECG ordered today shows atrial paced ventricular sinus rhythm with long AV delay 270 ms, right bundle branch block, left anterior fascicular block, QRS  of 146 ms, QTC 493ms.  Worsening AV delay, it is unchanged Recent Labs: May 2017 Glucose 126, TSH 3.79, creatinine 0.96, normal liver function tests, hemoglobin 17.3 Total cholesterol 134, triglycerides 120, LDL 84, HDL 34   Lipid Panel    Component Value Date/Time   CHOL 129 11/28/2016 0919   TRIG 113 11/28/2016 0919   HDL 30 (L) 11/28/2016 0919   CHOLHDL 4.3 11/28/2016 0919   VLDL 23 11/28/2016 0919   LDLCALC 76 11/28/2016 0919     ASSESSMENT:    1. Paroxysmal atrial fibrillation (HCC)   2. Second degree AV block, Mobitz type I    3. Pacemaker   4. Follow-up visit for aortic valve replacement with bioprosthetic valve   5. Essential hypertension   6. Type 2 diabetes mellitus without complication, without long-term current use of insulin (HCC)   7. Dyslipidemia   8. Medication management      PLAN:  In order of problems listed above:   1. PAF: He had postoperative atrial fibrillation but in palpating his follow-up has not had any pacemaker recorded arrhythmia.  He is not on anticoagulants. 2. Second degree AV block with substantial evidence of infra-hisian block.  His AV delay is getting longer longer, but so far very little ventricular pacing is necessary. 3. PPM:  Normal device function, continue remote downloads every 2 months and yearly office visit 4. S/P AVR with unexpected degree of aortic insufficiency, not hemodynamically significant at this point and without evidence of progression on previous echoes.  Last echo performed a year ago continue to show a relatively mild degree of aortic insufficiency and I do not think we need to continue to perform annual studies.  We will repeat it if he develops symptoms of aortic valve disease. 5. HTN: Treated to appropriate target range 6. DM: She reports a hemoglobin A1c of only 7%, uncontrolled. 7. HLP: He is exercising more than he was last year.  We will recheck lipid profile since he is fasting today.  Medication Adjustments/Labs and Tests Ordered: Current medicines are reviewed at length with the patient today.  Concerns regarding medicines are outlined above.  Medication changes, Labs and Tests ordered today are listed in the Patient Instructions below. Patient Instructions  Dr Sallyanne Kuster recommends that you continue on your current medications as directed. Please refer to the Current Medication list given to you today.  Your physician recommends that you return for lab work TODAY.  Remote monitoring is used to monitor your Pacemaker or ICD from home. This monitoring reduces the number of office visits required to check your device to one time per year. It allows [place] to keep an eye on the functioning of your device to ensure it is working properly. You are scheduled for a device check from home on Tuesday, September 17th, 2019. You may send your transmission at any time that day. If you have a wireless device, the transmission will be sent automatically. After your physician reviews your transmission, you will receive a notification with your next transmission date.  To improve our patient care and to more adequately follow your device, [facility] has decided, as a practice, to start following each patient  four times a year with your home monitor. This means that you may experience a remote appointment that is close to an in-office appointment with your physician. Your insurance will apply at the same rate as other remote monitoring transmissions.  Dr Sallyanne Kuster recommends that you schedule a follow-up appointment in 12 months  with a pacemaker check. You will receive a reminder letter in the mail two months in advance. If you don't receive a letter, please call our office to schedule the follow-up appointment.  If you need a refill on your cardiac medications before your next appointment, please call your pharmacy.      Signed, Sanda Klein, MD  03/21/2018 3:30 PM    [address], [place], [place]  [address] Phone: [phone]; Fax: [phone]

## 2018-03-22 LAB — LIPID PANEL
CHOLESTEROL TOTAL: 131 mg/dL (ref 100–199)
Chol/HDL Ratio: 4 ratio (ref 0.0–5.0)
HDL: 33 mg/dL — ABNORMAL LOW (ref >39)
LDL CALC: 80 mg/dL (ref 0–99)
Triglycerides: 89 mg/dL (ref 0–149)
VLDL Cholesterol Cal: 18 mg/dL (ref 5–40)

## 2018-03-22 LAB — COMPREHENSIVE METABOLIC PANEL
ALBUMIN: 4.4 g/dL (ref 3.5–4.8)
ALK PHOS: 56 IU/L (ref 39–117)
ALT: 23 IU/L (ref 0–44)
AST: 25 IU/L (ref 0–40)
Albumin/Globulin Ratio: 1.8 (ref 1.2–2.2)
BILIRUBIN TOTAL: 0.7 mg/dL (ref 0.0–1.2)
BUN / CREAT RATIO: 14 (ref 10–24)
BUN: 14 mg/dL (ref 8–27)
CHLORIDE: 104 mmol/L (ref 96–106)
CO2: 22 mmol/L (ref 20–29)
Calcium: 9.4 mg/dL (ref 8.6–10.2)
Creatinine, Ser: 0.97 mg/dL (ref 0.76–1.27)
GFR calc Af Amer: 90 mL/min/{1.73_m2} (ref >59)
GFR calc non Af Amer: 78 mL/min/{1.73_m2} (ref >59)
GLOBULIN, TOTAL: 2.4 g/dL (ref 1.5–4.5)
Glucose: 79 mg/dL (ref 65–99)
Potassium: 4.8 mmol/L (ref 3.5–5.2)
Sodium: 143 mmol/L (ref 134–144)
Total Protein: 6.8 g/dL (ref 6.0–8.5)

## 2018-04-15 ENCOUNTER — Other Ambulatory Visit: Payer: Self-pay | Admitting: *Deleted

## 2018-04-15 MED ORDER — LOSARTAN POTASSIUM 100 MG PO TABS: 100.0000 mg | ORAL_TABLET | Freq: Every day | ORAL | 3 refills | Status: DC

## 2018-05-28 ENCOUNTER — Ambulatory Visit (INDEPENDENT_AMBULATORY_CARE_PROVIDER_SITE_OTHER): Payer: Medicare Other | Admitting: *Deleted

## 2018-05-28 ENCOUNTER — Telehealth: Payer: Self-pay | Admitting: Cardiology

## 2018-05-28 DIAGNOSIS — I441 Atrioventricular block, second degree: Secondary | ICD-10-CM

## 2018-05-28 NOTE — Telephone Encounter (Signed)
LMOVM reminding pt to send remote transmission.   

## 2018-05-29 NOTE — Progress Notes (Signed)
Remote pacemaker transmission.   

## 2018-06-14 ENCOUNTER — Other Ambulatory Visit: Payer: Self-pay | Admitting: Cardiovascular Disease

## 2018-06-21 LAB — CUP PACEART REMOTE DEVICE CHECK
Brady Statistic AP VS Percent: 14.76 %
Brady Statistic AS VP Percent: 0.01 %
Brady Statistic AS VS Percent: 85.12 %
Date Time Interrogation Session: 20190917174114
Implantable Lead Implant Date: 20111110
Implantable Lead Location: 753859
Implantable Lead Location: 753860
Lead Channel Impedance Value: 392 Ohm
Lead Channel Impedance Value: 440 Ohm
Lead Channel Sensing Intrinsic Amplitude: 3.411 mV
Lead Channel Setting Pacing Amplitude: 2.5 V
Lead Channel Setting Sensing Sensitivity: 0.6 mV
MDC IDC LEAD IMPLANT DT: 20111110
MDC IDC MSMT BATTERY VOLTAGE: 2.95 V
MDC IDC MSMT LEADCHNL RA SENSING INTR AMPL: 1.854 mV
MDC IDC PG IMPLANT DT: 20111110
MDC IDC SET LEADCHNL RA PACING AMPLITUDE: 2 V
MDC IDC SET LEADCHNL RV PACING PULSEWIDTH: 0.4 ms
MDC IDC STAT BRADY AP VP PERCENT: 0.11 %
MDC IDC STAT BRADY RA PERCENT PACED: 14.82 %
MDC IDC STAT BRADY RV PERCENT PACED: 0.14 %

## 2018-07-04 DIAGNOSIS — Z86010 Personal history of colonic polyps: Secondary | ICD-10-CM | POA: Insufficient documentation

## 2018-07-04 DIAGNOSIS — K5900 Constipation, unspecified: Secondary | ICD-10-CM | POA: Insufficient documentation

## 2018-07-07 ENCOUNTER — Other Ambulatory Visit: Payer: Self-pay | Admitting: Cardiovascular Disease

## 2018-07-08 NOTE — Telephone Encounter (Signed)
Rx request sent to pharmacy.  

## 2018-08-27 ENCOUNTER — Ambulatory Visit (INDEPENDENT_AMBULATORY_CARE_PROVIDER_SITE_OTHER): Payer: Medicare Other

## 2018-08-27 ENCOUNTER — Telehealth: Payer: Self-pay

## 2018-08-27 DIAGNOSIS — I441 Atrioventricular block, second degree: Secondary | ICD-10-CM | POA: Diagnosis not present

## 2018-08-27 NOTE — Telephone Encounter (Signed)
LMOVM reminding pt to send remote transmission.   

## 2018-08-28 ENCOUNTER — Encounter: Payer: Self-pay | Admitting: Cardiology

## 2018-08-28 NOTE — Progress Notes (Signed)
Remote pacemaker transmission.   

## 2018-09-29 LAB — CUP PACEART REMOTE DEVICE CHECK
Battery Voltage: 2.93 V
Brady Statistic AP VP Percent: 0.21 %
Brady Statistic AS VP Percent: 0.04 %
Brady Statistic RV Percent Paced: 0.27 %
Date Time Interrogation Session: 20191217191558
Implantable Lead Implant Date: 20111110
Implantable Lead Location: 753859
Implantable Pulse Generator Implant Date: 20111110
Lead Channel Setting Pacing Amplitude: 2 V
Lead Channel Setting Pacing Amplitude: 2.5 V
Lead Channel Setting Pacing Pulse Width: 0.4 ms
Lead Channel Setting Sensing Sensitivity: 0.6 mV
MDC IDC LEAD IMPLANT DT: 20111110
MDC IDC LEAD LOCATION: 753860
MDC IDC MSMT LEADCHNL RA IMPEDANCE VALUE: 392 Ohm
MDC IDC MSMT LEADCHNL RA SENSING INTR AMPL: 1.811 mV
MDC IDC MSMT LEADCHNL RV IMPEDANCE VALUE: 456 Ohm
MDC IDC MSMT LEADCHNL RV SENSING INTR AMPL: 3.752 mV
MDC IDC STAT BRADY AP VS PERCENT: 15.3 %
MDC IDC STAT BRADY AS VS PERCENT: 84.46 %
MDC IDC STAT BRADY RA PERCENT PACED: 15.26 %

## 2018-11-26 ENCOUNTER — Ambulatory Visit (INDEPENDENT_AMBULATORY_CARE_PROVIDER_SITE_OTHER): Payer: Medicare Other | Admitting: *Deleted

## 2018-11-26 ENCOUNTER — Other Ambulatory Visit: Payer: Self-pay

## 2018-11-26 DIAGNOSIS — I441 Atrioventricular block, second degree: Secondary | ICD-10-CM

## 2018-11-27 LAB — CUP PACEART REMOTE DEVICE CHECK
Battery Voltage: 2.92 V
Brady Statistic AP VP Percent: 0.28 %
Brady Statistic AS VP Percent: 0.08 %
Brady Statistic AS VS Percent: 82.04 %
Brady Statistic RA Percent Paced: 17.32 %
Brady Statistic RV Percent Paced: 0.39 %
Date Time Interrogation Session: 20200318015738
Implantable Lead Implant Date: 20111110
Implantable Lead Location: 753859
Implantable Lead Location: 753860
Implantable Pulse Generator Implant Date: 20111110
Lead Channel Impedance Value: 464 Ohm
Lead Channel Setting Pacing Amplitude: 2 V
Lead Channel Setting Pacing Amplitude: 2.5 V
Lead Channel Setting Pacing Pulse Width: 0.4 ms
MDC IDC LEAD IMPLANT DT: 20111110
MDC IDC MSMT LEADCHNL RA IMPEDANCE VALUE: 376 Ohm
MDC IDC MSMT LEADCHNL RA SENSING INTR AMPL: 1.94 mV
MDC IDC MSMT LEADCHNL RV SENSING INTR AMPL: 3.07 mV
MDC IDC SET LEADCHNL RV SENSING SENSITIVITY: 0.6 mV
MDC IDC STAT BRADY AP VS PERCENT: 17.6 %

## 2018-12-04 ENCOUNTER — Encounter: Payer: Self-pay | Admitting: Cardiology

## 2018-12-04 NOTE — Progress Notes (Signed)
Remote pacemaker transmission.   

## 2019-02-25 ENCOUNTER — Ambulatory Visit (INDEPENDENT_AMBULATORY_CARE_PROVIDER_SITE_OTHER): Payer: Medicare Other | Admitting: *Deleted

## 2019-02-25 DIAGNOSIS — I441 Atrioventricular block, second degree: Secondary | ICD-10-CM | POA: Diagnosis not present

## 2019-02-25 LAB — CUP PACEART REMOTE DEVICE CHECK
Battery Voltage: 2.92 V
Brady Statistic AP VP Percent: 0.86 %
Brady Statistic AP VS Percent: 23.58 %
Brady Statistic AS VP Percent: 0.15 %
Brady Statistic AS VS Percent: 75.41 %
Brady Statistic RA Percent Paced: 23.48 %
Brady Statistic RV Percent Paced: 1.07 %
Date Time Interrogation Session: 20200616105516
Implantable Lead Implant Date: 20111110
Implantable Lead Implant Date: 20111110
Implantable Lead Location: 753859
Implantable Lead Location: 753860
Implantable Pulse Generator Implant Date: 20111110
Lead Channel Impedance Value: 408 Ohm
Lead Channel Impedance Value: 616 Ohm
Lead Channel Sensing Intrinsic Amplitude: 1.768 mV
Lead Channel Sensing Intrinsic Amplitude: 4.093 mV
Lead Channel Setting Pacing Amplitude: 2 V
Lead Channel Setting Pacing Amplitude: 2.5 V
Lead Channel Setting Pacing Pulse Width: 0.4 ms
Lead Channel Setting Sensing Sensitivity: 0.6 mV

## 2019-03-08 ENCOUNTER — Encounter: Payer: Self-pay | Admitting: Cardiology

## 2019-03-08 NOTE — Progress Notes (Signed)
Remote pacemaker transmission.   

## 2019-03-12 ENCOUNTER — Other Ambulatory Visit: Payer: Self-pay | Admitting: Cardiovascular Disease

## 2019-03-23 ENCOUNTER — Other Ambulatory Visit: Payer: Self-pay | Admitting: Cardiovascular Disease

## 2019-05-27 ENCOUNTER — Ambulatory Visit (INDEPENDENT_AMBULATORY_CARE_PROVIDER_SITE_OTHER): Payer: Medicare Other | Admitting: *Deleted

## 2019-05-27 DIAGNOSIS — I441 Atrioventricular block, second degree: Secondary | ICD-10-CM | POA: Diagnosis not present

## 2019-05-27 DIAGNOSIS — I4891 Unspecified atrial fibrillation: Secondary | ICD-10-CM

## 2019-05-29 LAB — CUP PACEART REMOTE DEVICE CHECK
Battery Voltage: 2.9 V
Brady Statistic AP VP Percent: 0.99 %
Brady Statistic AP VS Percent: 30.29 %
Brady Statistic AS VP Percent: 0.14 %
Brady Statistic AS VS Percent: 68.58 %
Brady Statistic RA Percent Paced: 29.92 %
Brady Statistic RV Percent Paced: 1.2 %
Date Time Interrogation Session: 20200916201908
Implantable Lead Implant Date: 20111110
Implantable Lead Implant Date: 20111110
Implantable Lead Location: 753859
Implantable Lead Location: 753860
Implantable Pulse Generator Implant Date: 20111110
Lead Channel Impedance Value: 384 Ohm
Lead Channel Impedance Value: 712 Ohm
Lead Channel Sensing Intrinsic Amplitude: 1.768 mV
Lead Channel Sensing Intrinsic Amplitude: 2.388 mV
Lead Channel Setting Pacing Amplitude: 2 V
Lead Channel Setting Pacing Amplitude: 2.5 V
Lead Channel Setting Pacing Pulse Width: 0.4 ms
Lead Channel Setting Sensing Sensitivity: 0.6 mV

## 2019-06-03 NOTE — Progress Notes (Signed)
Remote pacemaker transmission.   

## 2019-06-21 ENCOUNTER — Other Ambulatory Visit: Payer: Self-pay | Admitting: Cardiovascular Disease

## 2019-08-03 ENCOUNTER — Other Ambulatory Visit: Payer: Self-pay | Admitting: Cardiovascular Disease

## 2019-08-26 ENCOUNTER — Ambulatory Visit (INDEPENDENT_AMBULATORY_CARE_PROVIDER_SITE_OTHER): Payer: Medicare Other | Admitting: *Deleted

## 2019-08-26 DIAGNOSIS — Z950 Presence of cardiac pacemaker: Secondary | ICD-10-CM | POA: Diagnosis not present

## 2019-08-26 LAB — CUP PACEART REMOTE DEVICE CHECK
Battery Voltage: 2.9 V
Brady Statistic AP VP Percent: 0.76 %
Brady Statistic AP VS Percent: 31.47 %
Brady Statistic AS VP Percent: 0.16 %
Brady Statistic AS VS Percent: 67.61 %
Brady Statistic RA Percent Paced: 30.54 %
Brady Statistic RV Percent Paced: 1 %
Date Time Interrogation Session: 20201215080523
Implantable Lead Implant Date: 20111110
Implantable Lead Implant Date: 20111110
Implantable Lead Location: 753859
Implantable Lead Location: 753860
Implantable Pulse Generator Implant Date: 20111110
Lead Channel Impedance Value: 376 Ohm
Lead Channel Impedance Value: 656 Ohm
Lead Channel Sensing Intrinsic Amplitude: 1.725 mV
Lead Channel Sensing Intrinsic Amplitude: 2.047 mV
Lead Channel Setting Pacing Amplitude: 2 V
Lead Channel Setting Pacing Amplitude: 2.5 V
Lead Channel Setting Pacing Pulse Width: 0.4 ms
Lead Channel Setting Sensing Sensitivity: 0.6 mV

## 2019-09-27 NOTE — Progress Notes (Signed)
PPM remote 

## 2019-11-07 ENCOUNTER — Ambulatory Visit: Payer: Medicare Other | Attending: Internal Medicine

## 2019-11-07 DIAGNOSIS — Z23 Encounter for immunization: Secondary | ICD-10-CM | POA: Insufficient documentation

## 2019-11-07 NOTE — Progress Notes (Signed)
   Covid-19 Vaccination Clinic  Name:  Adonys Kaden    MRN: [id number] DOB: August 03, 1946  11/07/2019  Mr. Markel was observed post Covid-19 immunization for 15 minutes without incidence. He was provided with Vaccine Information Sheet and instruction to access the V-Safe system.   Mr. Haydu was instructed to call 911 with any severe reactions post vaccine: [name] Difficulty breathing  . Swelling of your face and throat  . A fast heartbeat  . A bad rash all over your body  . Dizziness and weakness    Immunizations Administered    Name Date Dose VIS Date Route   Pfizer COVID-19 Vaccine 11/07/2019  4:12 PM 0.3 mL 08/22/2019 Intramuscular   Manufacturer: [place]   Lot: [id number]   [place]: [id number]

## 2019-11-25 ENCOUNTER — Ambulatory Visit (INDEPENDENT_AMBULATORY_CARE_PROVIDER_SITE_OTHER): Payer: Medicare Other | Admitting: *Deleted

## 2019-11-25 DIAGNOSIS — Z950 Presence of cardiac pacemaker: Secondary | ICD-10-CM | POA: Diagnosis not present

## 2019-11-27 LAB — CUP PACEART REMOTE DEVICE CHECK
Battery Voltage: 2.89 V
Brady Statistic AP VP Percent: 0.35 %
Brady Statistic AP VS Percent: 23.87 %
Brady Statistic AS VP Percent: 0.11 %
Brady Statistic AS VS Percent: 75.66 %
Brady Statistic RA Percent Paced: 23.61 %
Brady Statistic RV Percent Paced: 0.52 %
Date Time Interrogation Session: 20210317163010
Implantable Lead Implant Date: 20111110
Implantable Lead Implant Date: 20111110
Implantable Lead Location: 753859
Implantable Lead Location: 753860
Implantable Pulse Generator Implant Date: 20111110
Lead Channel Impedance Value: 424 Ohm
Lead Channel Impedance Value: 640 Ohm
Lead Channel Sensing Intrinsic Amplitude: 1.725 mV
Lead Channel Sensing Intrinsic Amplitude: 2.047 mV
Lead Channel Setting Pacing Amplitude: 2 V
Lead Channel Setting Pacing Amplitude: 2.5 V
Lead Channel Setting Pacing Pulse Width: 0.4 ms
Lead Channel Setting Sensing Sensitivity: 0.6 mV

## 2019-11-27 NOTE — Progress Notes (Signed)
PPM Remote  

## 2019-12-03 ENCOUNTER — Ambulatory Visit: Payer: Medicare Other | Attending: Internal Medicine

## 2019-12-03 DIAGNOSIS — Z23 Encounter for immunization: Secondary | ICD-10-CM

## 2019-12-03 NOTE — Progress Notes (Signed)
   Covid-19 Vaccination Clinic  Name:  Carnelius Segraves    MRN: [id number] DOB: 02/11/1946  12/03/2019  Mr. Misfeldt was observed post Covid-19 immunization for 15 minutes without incident. He was provided with Vaccine Information Sheet and instruction to access the V-Safe system.   Mr. Vaal was instructed to call 911 with any severe reactions post vaccine: [name] Difficulty breathing  . Swelling of face and throat  . A fast heartbeat  . A bad rash all over body  . Dizziness and weakness   Immunizations Administered    Name Date Dose VIS Date Route   Pfizer COVID-19 Vaccine 12/03/2019  8:21 AM 0.3 mL 08/22/2019 Intramuscular   Manufacturer: [place]   Lot: [id number]   [place]: [id number]

## 2019-12-08 ENCOUNTER — Ambulatory Visit (INDEPENDENT_AMBULATORY_CARE_PROVIDER_SITE_OTHER): Payer: Medicare Other | Admitting: Cardiovascular Disease

## 2019-12-08 ENCOUNTER — Encounter: Payer: Self-pay | Admitting: Cardiovascular Disease

## 2019-12-08 ENCOUNTER — Other Ambulatory Visit: Payer: Self-pay

## 2019-12-08 VITALS — BP 130/68 | HR 67 | Ht 71.0 in | Wt 187.0 lb

## 2019-12-08 DIAGNOSIS — Z952 Presence of prosthetic heart valve: Secondary | ICD-10-CM

## 2019-12-08 DIAGNOSIS — E119 Type 2 diabetes mellitus without complications: Secondary | ICD-10-CM

## 2019-12-08 DIAGNOSIS — E785 Hyperlipidemia, unspecified: Secondary | ICD-10-CM | POA: Diagnosis not present

## 2019-12-08 DIAGNOSIS — I10 Essential (primary) hypertension: Secondary | ICD-10-CM

## 2019-12-08 DIAGNOSIS — I491 Atrial premature depolarization: Secondary | ICD-10-CM

## 2019-12-08 DIAGNOSIS — I441 Atrioventricular block, second degree: Secondary | ICD-10-CM

## 2019-12-08 DIAGNOSIS — Z950 Presence of cardiac pacemaker: Secondary | ICD-10-CM

## 2019-12-08 NOTE — Patient Instructions (Addendum)
Medication Instructions:  Losartan has been taken off of your medication list.   *If you need a refill on your cardiac medications before your next appointment, please call your pharmacy*   Lab Work: Your provider would like for you to have the following labs today: Lipid, A1C and CMET  If you have labs (blood work) drawn today and your tests are completely normal, you will receive your results only by: [name] MyChart Message (if you have MyChart) OR . A paper copy in the mail If you have any lab test that is abnormal or we need to change your treatment, we will call you to review the results.   Testing/Procedures: Your physician has requested that you have an echocardiogram. Echocardiography is a painless test that uses sound waves to create images of your heart. It provides your doctor with information about the size and shape of your heart and how well your heart's chambers and valves are working. You may receive an ultrasound enhancing agent through an IV if needed to better visualize your heart during the echo.This procedure takes approximately one hour. There are no restrictions for this procedure. This will take place at the [address].     Follow-Up: At [facility], you and your health needs are our priority.  As part of our continuing mission to provide you with exceptional heart care, we have created designated Provider Care Teams.  These Care Teams include your primary Cardiologist (physician) and Advanced Practice Providers (APPs -  Physician Assistants and Nurse Practitioners) who all work together to provide you with the care you need, when you need it.  We recommend signing up for the patient portal called "MyChart".  Sign up information is provided on this After Visit Summary.  MyChart is used to connect with patients for Virtual Visits (Telemedicine).  Patients are able to view lab/test results, encounter notes, upcoming appointments, etc.  Non-urgent messages  can be sent to your provider as well.   To learn more about what you can do with MyChart, go to [email or web address].    Your next appointment:   12 month(s)  The format for your next appointment:   In Person  Provider:   Sanda Klein, MD

## 2019-12-08 NOTE — Progress Notes (Signed)
Patient ID: [name] [name], male   DOB: Nov 19, 1945, 74 y.o.   MRN: [id number]    Cardiology Office Note    Date:  12/08/2019   ID:  [name] [name], DOB Feb 27, 1946, MRN [id number]  PCP:  Patient, No Pcp Per  Cardiologist:   Sanda Klein, MD   Chief Complaint  Patient presents with  . Irregular Heart Beat  . Cardiac Valve Problem  . Pacemaker Check    History of Present Illness:  [name] [name] is a 74 y.o. male who presents for follow-up for valvular heart disease and AV node conduction abnormalities with pacemaker implantation. He underwent aortic valve replacement in January 2012 (21 mm bioprosthesis) for symptomatic aortic stenosis. Early on, his prosthesis showed evidence of mild to moderate aortic insufficiency that has not progressed over time. He has preserved left ventricular systolic function. In November 2011 she received a dual-chamber permanent pacemaker for syncope that was likely related to second degree atrioventricular block. He has an MRI conditional Medtronic Revo device. 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  As before, he is less active during the winter months, but his activity level is beginning to pick up already.  In the summer he will work for walk for over 5 hours a day, that is down to about 2 hours a day during the winter months.  He denies any difficulty with activity. The patient specifically denies any chest pain at rest exertion, dyspnea at rest or with exertion, orthopnea, paroxysmal nocturnal dyspnea, syncope, palpitations, focal neurological deficits, intermittent claudication, lower extremity edema, unexplained weight gain, cough, hemoptysis or wheezing.  His glucose control remains great.  His last hemoglobin A1c was 5.8% per his report.  Last time his A1c was as low as 5.1% last August.  He is only taking Metformin.  He has steadily lost weight and his BMI is now down to  26.  Pacemaker interrogation shows normal device function.  His battery voltage 2.89 V and is approaching RRT (2.81 V).  He is not device dependent and only paces the atrium roughly 20% of the time in the ventricle 0.5% of the time.  He is only had 2 episodes of atrial mode switch in the last 2 years, both of them less than 5 minutes duration.  In February 2020 he had an episode of atrial flutter with variable AV block that lasted for 3 minutes and 53 seconds.  Lead parameters remain excellent.  Heart rate histogram distribution is appropriate.  He is seeing [name] for endocrinology follow-up in [place].     Past Medical History:  Diagnosis Date  . Aortic insufficiency   . Atrial fibrillation ([place])   . AV block, 2nd degree   . Diabetes ([place])   . Dyslipidemia   . Hypertension   . LAFB (left anterior fascicular block)   . Mitral insufficiency   . RBBB   . Ventricular tachycardia ([place])   . Ventricular tachycardia, non-sustained [facility])     Past Surgical History:  Procedure Laterality Date  . AORTIC VALVE REPLACEMENT  10/06/2010   [facility] Ease prosthesis  . BACK SURGERY    . CARDIAC CATHETERIZATION  06/30/2010   nonobstructive CAD, heavily ca+ AOV  . NM MYOCAR PERF WALL MOTION  02/28/2007   normal  . PERMANENT PACEMAKER INSERTION  07/21/2010   Medtronic    Outpatient Medications Prior to Visit  Medication Sig Dispense Refill  . amLODipine (NORVASC) 5 MG tablet Take  1 tablet (5 mg total) by mouth daily. OFFICE VISIT NEEDED 90 tablet 0  . aspirin EC 81 MG tablet Take 81 mg by mouth daily.    [name] atorvastatin (LIPITOR) 40 MG tablet TAKE 1 TABLET DAILY 90 tablet 4  . metFORMIN (GLUCOPHAGE) 500 MG tablet TAKE 1/2 TABLET BY MOUTH EVERY MORNING AND 1 TABLET BY MOUTH EVERY EVENING    . metoprolol tartrate (LOPRESSOR) 50 MG tablet TAKE 1 TABLET TWICE A DAY 180 tablet 4  . glipiZIDE-metformin (METAGLIP) 5-500 MG per tablet Take 1 tablet by mouth 2 (two) times daily before a meal.  1/2 tab in the morning, 1 tab at night.    . losartan (COZAAR) 100 MG tablet TAKE 1 TABLET DAILY (DUE FOR FOLLOW UP. PLEASE MAKE APPOINTMENT FOR FUTURE REFILLS) 15 tablet 23  . irbesartan (AVAPRO) 300 MG tablet TAKE 1 TABLET (300 MG TOTAL) BY MOUTH DAILY.     No facility-administered medications prior to visit.     Allergies:   Patient has no known allergies.   Social History   Socioeconomic History  . Marital status: Single    Spouse name: Not on file  . Number of children: Not on file  . Years of education: Not on file  . Highest education level: Not on file  Occupational History  . Not on file  Tobacco Use  . Smoking status: Never Smoker  . Smokeless tobacco: Never Used  Substance and Sexual Activity  . Alcohol use: No  . Drug use: No  . Sexual activity: Not on file  Other Topics Concern  . Not on file  Social History Narrative  . Not on file   Social Determinants of Health   Financial Resource Strain:   . Difficulty of Paying Living Expenses:   Food Insecurity:   . Worried About Charity fundraiser in the Last Year:   . Arboriculturist in the Last Year:   Transportation Needs:   . Film/video editor (Medical):   [name] Lack of Transportation (Non-Medical):   Physical Activity:   . Days of Exercise per Week:   . Minutes of Exercise per Session:   Stress:   . Feeling of Stress :   Social Connections:   . Frequency of Communication with Friends and Family:   . Frequency of Social Gatherings with Friends and Family:   . Attends Religious Services:   . Active Member of Clubs or Organizations:   . Attends Archivist Meetings:   [name] Marital Status:      Family History:  The patient's brother recently underwent prostatectomy for prostate cancer  ROS:   Please see the history of present illness.    ROS All other systems are reviewed and are negative.   PHYSICAL EXAM:   VS:  BP 130/68   Pulse 67   Ht 5\' 11"  (1.803 m)   Wt 187 lb (84.8 kg)   SpO2  97%   BMI 26.08 kg/m     General: Alert, oriented x3, no distress, minimally overweight, healthy left subclavian pacemaker site. Head: no evidence of trauma, PERRL, EOMI, no exophtalmos or lid lag, no myxedema, no xanthelasma; normal ears, nose and oropharynx Neck: normal jugular venous pulsations and no hepatojugular reflux; brisk carotid pulses without delay and no carotid bruits Chest: clear to auscultation, no signs of consolidation by percussion or palpation, normal fremitus, symmetrical and full respiratory excursions Cardiovascular: normal position and quality of the apical impulse, regular rhythm, normal first and  widely split second heart sounds, no rubs or gallops, 2/6 early peaking systolic ejection murmur in the aortic focus, barely audible diastolic decrescendo murmur of aortic insufficiency Abdomen: no tenderness or distention, no masses by palpation, no abnormal pulsatility or arterial bruits, normal bowel sounds, no hepatosplenomegaly Extremities: no clubbing, cyanosis or edema; 2+ radial, ulnar and brachial pulses bilaterally; 2+ right femoral, posterior tibial and dorsalis pedis pulses; 2+ left femoral, posterior tibial and dorsalis pedis pulses; no subclavian or femoral bruits Neurological: grossly nonfocal Psych: Normal mood and affect   Wt Readings from Last 3 Encounters:  12/08/19 187 lb (84.8 kg)  03/21/18 210 lb 12.8 oz (95.6 kg)  11/28/16 212 lb (96.2 kg)      Studies/Labs Reviewed:   EKG:  EKG is ordered today.  It has not changed since previous tracings and shows sinus rhythm with trifascicular block (PR 230 ms, right bundle branch block, left anterior fascicular block), QTC 486 ms.    Recent Labs: May 2017 Glucose 126, TSH 3.79, creatinine 0.96, normal liver function tests, hemoglobin 17.3 Total cholesterol 134, triglycerides 120, LDL 84, HDL 34   Lipid Panel    Component Value Date/Time   CHOL 131 03/21/2018 1209   TRIG 89 03/21/2018 1209   HDL 33 (L)  03/21/2018 1209   CHOLHDL 4.0 03/21/2018 1209   CHOLHDL 4.3 11/28/2016 0919   VLDL 23 11/28/2016 0919   LDLCALC 80 03/21/2018 1209     ASSESSMENT:    1. Type 2 diabetes mellitus without complication, without long-term current use of insulin ([place])   2. Dyslipidemia   3. H/O aortic valve replacement   4. Second degree AV block, Mobitz type I    5. Essential hypertension   6. PAC (premature atrial contraction)   7. Pacemaker- Medtronic Revo, Nov 2011      PLAN:  In order of problems listed above:   1. PAF: He had postoperative atrial fibrillation and had the 4-minute episode of atrial flutter in February 2020.  He is not on anticoagulation.  He has no history of embolic events.  Continue monitoring weekly pacemaker. 2. Second degree AV block: Also with right bundle branch block and left anterior fascicular block, but so far with almost no ventricular pacing necessary. 3. PPM: Anticipate possible need for generator change in the next 12 months or so.  Discussed the procedure in some detail today.  She is compliant with remote downloads every 3 months. 4. S/P AVR with unexpected degree of aortic insufficiency, but this has been stable over time.  Has not had an echo in about 3 years and will repeat it in the near future.. 5. HTN: Well treated.  Continue ARB which has the added benefit of renal protection and diabetes. 6. DM: Very well controlled on Metformin monotherapy is not a great job losing weight. 7. HLP: Time to reevaluate.  Target LDL less than 100.  Suspect his HDL may have improved with his weight loss.  Medication Adjustments/Labs and Tests Ordered: Current medicines are reviewed at length with the patient today.  Concerns regarding medicines are outlined above.  Medication changes, Labs and Tests ordered today are listed in the Patient Instructions below. Patient Instructions  Medication Instructions:  Losartan has been taken off of your medication list.   *If you need a  refill on your cardiac medications before your next appointment, please call your pharmacy*   Lab Work: Your provider would like for you to have the following labs today: Lipid, A1C and CMET  If you have labs (blood work) drawn today and your tests are completely normal, you will receive your results only by: [name] MyChart Message (if you have MyChart) OR . A paper copy in the mail If you have any lab test that is abnormal or we need to change your treatment, we will call you to review the results.   Testing/Procedures: Your physician has requested that you have an echocardiogram. Echocardiography is a painless test that uses sound waves to create images of your heart. It provides your doctor with information about the size and shape of your heart and how well your heart's chambers and valves are working. You may receive an ultrasound enhancing agent through an IV if needed to better visualize your heart during the echo.This procedure takes approximately one hour. There are no restrictions for this procedure. This will take place at the [address], [address].     Follow-Up: At [facility], you and your health needs are our priority.  As part of our continuing mission to provide you with exceptional heart care, we have created designated Provider Care Teams.  These Care Teams include your primary Cardiologist (physician) and Advanced Practice Providers (APPs -  Physician Assistants and Nurse Practitioners) who all work together to provide you with the care you need, when you need it.  We recommend signing up for the patient portal called "MyChart".  Sign up information is provided on this After Visit Summary.  MyChart is used to connect with patients for Virtual Visits (Telemedicine).  Patients are able to view lab/test results, encounter notes, upcoming appointments, etc.  Non-urgent messages can be sent to your provider as well.   To learn more about what you can do with MyChart, go to  [email or web address].    Your next appointment:   12 month(s)  The format for your next appointment:   In Person  Provider:   Sanda Klein, MD         Signed, Sanda Klein, MD  12/08/2019 8:45 AM    [address], [place], [place]  [address] Phone: [phone]; Fax: [phone]

## 2019-12-09 ENCOUNTER — Encounter: Payer: Self-pay | Admitting: *Deleted

## 2019-12-09 LAB — COMPREHENSIVE METABOLIC PANEL
ALT: 18 IU/L (ref 0–44)
AST: 27 IU/L (ref 0–40)
Albumin/Globulin Ratio: 1.9 (ref 1.2–2.2)
Albumin: 4.7 g/dL (ref 3.7–4.7)
Alkaline Phosphatase: 78 IU/L (ref 39–117)
BUN/Creatinine Ratio: 17 (ref 10–24)
BUN: 15 mg/dL (ref 8–27)
Bilirubin Total: 1.1 mg/dL (ref 0.0–1.2)
CO2: 23 mmol/L (ref 20–29)
Calcium: 10 mg/dL (ref 8.6–10.2)
Chloride: 100 mmol/L (ref 96–106)
Creatinine, Ser: 0.86 mg/dL (ref 0.76–1.27)
GFR calc Af Amer: 99 mL/min/{1.73_m2} (ref >59)
GFR calc non Af Amer: 85 mL/min/{1.73_m2} (ref >59)
Globulin, Total: 2.5 g/dL (ref 1.5–4.5)
Glucose: 99 mg/dL (ref 65–99)
Potassium: 4.8 mmol/L (ref 3.5–5.2)
Sodium: 139 mmol/L (ref 134–144)
Total Protein: 7.2 g/dL (ref 6.0–8.5)

## 2019-12-09 LAB — LIPID PANEL
Chol/HDL Ratio: 3.5 ratio (ref 0.0–5.0)
Cholesterol, Total: 137 mg/dL (ref 100–199)
HDL: 39 mg/dL — ABNORMAL LOW (ref >39)
LDL Chol Calc (NIH): 83 mg/dL (ref 0–99)
Triglycerides: 77 mg/dL (ref 0–149)
VLDL Cholesterol Cal: 15 mg/dL (ref 5–40)

## 2019-12-09 LAB — HEMOGLOBIN A1C
Est. average glucose Bld gHb Est-mCnc: 123 mg/dL
Hgb A1c MFr Bld: 5.9 % — ABNORMAL HIGH (ref 4.8–5.6)

## 2019-12-25 ENCOUNTER — Other Ambulatory Visit: Payer: Self-pay

## 2019-12-25 ENCOUNTER — Ambulatory Visit (HOSPITAL_COMMUNITY): Payer: Medicare Other | Attending: Internal Medicine

## 2019-12-25 DIAGNOSIS — Z952 Presence of prosthetic heart valve: Secondary | ICD-10-CM | POA: Diagnosis not present

## 2019-12-25 MED ORDER — PERFLUTREN LIPID MICROSPHERE
1.0000 mL | INTRAVENOUS | Status: AC | PRN
  Administered 2019-12-25: 2 mL via INTRAVENOUS

## 2019-12-29 ENCOUNTER — Encounter: Payer: Self-pay | Admitting: *Deleted

## 2020-01-13 ENCOUNTER — Other Ambulatory Visit: Payer: Self-pay | Admitting: Cardiovascular Disease

## 2020-02-24 ENCOUNTER — Ambulatory Visit (INDEPENDENT_AMBULATORY_CARE_PROVIDER_SITE_OTHER): Payer: Medicare Other | Admitting: *Deleted

## 2020-02-24 DIAGNOSIS — I441 Atrioventricular block, second degree: Secondary | ICD-10-CM

## 2020-02-25 LAB — CUP PACEART REMOTE DEVICE CHECK
Battery Voltage: 2.88 V
Brady Statistic AP VP Percent: 0.6 %
Brady Statistic AP VS Percent: 25.33 %
Brady Statistic AS VP Percent: 0.03 %
Brady Statistic AS VS Percent: 74.03 %
Brady Statistic RA Percent Paced: 25.73 %
Brady Statistic RV Percent Paced: 0.67 %
Date Time Interrogation Session: 20210615070800
Implantable Lead Implant Date: 20111110
Implantable Lead Implant Date: 20111110
Implantable Lead Location: 753859
Implantable Lead Location: 753860
Implantable Pulse Generator Implant Date: 20111110
Lead Channel Impedance Value: 392 Ohm
Lead Channel Impedance Value: 600 Ohm
Lead Channel Sensing Intrinsic Amplitude: 1.811 mV
Lead Channel Sensing Intrinsic Amplitude: 2.047 mV
Lead Channel Setting Pacing Amplitude: 2 V
Lead Channel Setting Pacing Amplitude: 2.5 V
Lead Channel Setting Pacing Pulse Width: 0.4 ms
Lead Channel Setting Sensing Sensitivity: 0.6 mV

## 2020-02-25 NOTE — Progress Notes (Signed)
Remote pacemaker transmission.   

## 2020-05-25 ENCOUNTER — Ambulatory Visit (INDEPENDENT_AMBULATORY_CARE_PROVIDER_SITE_OTHER): Payer: Medicare Other | Admitting: *Deleted

## 2020-05-25 DIAGNOSIS — I441 Atrioventricular block, second degree: Secondary | ICD-10-CM

## 2020-05-25 LAB — CUP PACEART REMOTE DEVICE CHECK
Battery Voltage: 2.88 V
Brady Statistic AP VP Percent: 0.88 %
Brady Statistic AP VS Percent: 34.92 %
Brady Statistic AS VP Percent: 0.06 %
Brady Statistic AS VS Percent: 64.14 %
Brady Statistic RA Percent Paced: 35.02 %
Brady Statistic RV Percent Paced: 0.99 %
Date Time Interrogation Session: 20210914071909
Implantable Lead Implant Date: 20111110
Implantable Lead Implant Date: 20111110
Implantable Lead Location: 753859
Implantable Lead Location: 753860
Implantable Pulse Generator Implant Date: 20111110
Lead Channel Impedance Value: 400 Ohm
Lead Channel Impedance Value: 592 Ohm
Lead Channel Sensing Intrinsic Amplitude: 1.854 mV
Lead Channel Sensing Intrinsic Amplitude: 2.729 mV
Lead Channel Setting Pacing Amplitude: 2 V
Lead Channel Setting Pacing Amplitude: 2.5 V
Lead Channel Setting Pacing Pulse Width: 0.4 ms
Lead Channel Setting Sensing Sensitivity: 0.6 mV

## 2020-05-27 NOTE — Progress Notes (Signed)
Remote pacemaker transmission.   

## 2020-06-26 ENCOUNTER — Ambulatory Visit: Payer: Medicare Other | Attending: Internal Medicine

## 2020-06-26 DIAGNOSIS — Z23 Encounter for immunization: Secondary | ICD-10-CM

## 2020-06-26 NOTE — Progress Notes (Signed)
   Covid-19 Vaccination Clinic  Name:  Yusuf Yu    MRN: [id number] DOB: [birth year]  06/26/2020  Mr. Wieman was observed post Covid-19 immunization for 15 minutes without incident. He was provided with Vaccine Information Sheet and instruction to access the V-Safe system.   Mr. Faison was instructed to call 911 with any severe reactions post vaccine: [name] Difficulty breathing  . Swelling of face and throat  . A fast heartbeat  . A bad rash all over body  . Dizziness and weakness

## 2020-08-24 ENCOUNTER — Ambulatory Visit (INDEPENDENT_AMBULATORY_CARE_PROVIDER_SITE_OTHER): Payer: Medicare Other

## 2020-08-24 DIAGNOSIS — I441 Atrioventricular block, second degree: Secondary | ICD-10-CM

## 2020-08-24 LAB — CUP PACEART REMOTE DEVICE CHECK
Battery Voltage: 2.86 V
Brady Statistic AP VP Percent: 0.37 %
Brady Statistic AP VS Percent: 23.34 %
Brady Statistic AS VP Percent: 0.07 %
Brady Statistic AS VS Percent: 76.22 %
Brady Statistic RA Percent Paced: 23.49 %
Brady Statistic RV Percent Paced: 0.46 %
Date Time Interrogation Session: 20211214093742
Implantable Lead Implant Date: 20111110
Implantable Lead Implant Date: 20111110
Implantable Lead Location: 753859
Implantable Lead Location: 753860
Implantable Pulse Generator Implant Date: 20111110
Lead Channel Impedance Value: 416 Ohm
Lead Channel Impedance Value: 608 Ohm
Lead Channel Sensing Intrinsic Amplitude: 1.94 mV
Lead Channel Sensing Intrinsic Amplitude: 3.07 mV
Lead Channel Setting Pacing Amplitude: 2 V
Lead Channel Setting Pacing Amplitude: 2.5 V
Lead Channel Setting Pacing Pulse Width: 0.4 ms
Lead Channel Setting Sensing Sensitivity: 0.6 mV

## 2020-09-08 NOTE — Progress Notes (Signed)
Remote pacemaker transmission.   

## 2020-11-23 ENCOUNTER — Ambulatory Visit: Payer: Medicare Other

## 2020-11-24 ENCOUNTER — Telehealth: Payer: Self-pay

## 2020-11-24 LAB — CUP PACEART REMOTE DEVICE CHECK
Battery Voltage: 2.83 V
Brady Statistic AP VP Percent: 0.31 %
Brady Statistic AP VS Percent: 18.31 %
Brady Statistic AS VP Percent: 0.08 %
Brady Statistic AS VS Percent: 81.3 %
Brady Statistic RA Percent Paced: 18.52 %
Brady Statistic RV Percent Paced: 0.4 %
Date Time Interrogation Session: 20220316101929
Implantable Lead Implant Date: 20111110
Implantable Lead Implant Date: 20111110
Implantable Lead Location: 753859
Implantable Lead Location: 753860
Implantable Pulse Generator Implant Date: 20111110
Lead Channel Impedance Value: 400 Ohm
Lead Channel Impedance Value: 592 Ohm
Lead Channel Sensing Intrinsic Amplitude: 1.854 mV
Lead Channel Sensing Intrinsic Amplitude: 3.411 mV
Lead Channel Setting Pacing Amplitude: 2 V
Lead Channel Setting Pacing Amplitude: 2.5 V
Lead Channel Setting Pacing Pulse Width: 0.4 ms
Lead Channel Setting Sensing Sensitivity: 0.6 mV

## 2020-11-24 NOTE — Telephone Encounter (Signed)
Unsuccessful telephone call to Oneita Jolly to discuss Carelink alert received today for "battery status" nearing voltage for RRT. Hipaa compliant VM message left requesting call back to [phone]. Monthly remotes scheduled. Will await patient call back for notification.

## 2020-12-01 NOTE — Telephone Encounter (Signed)
Second unsuccessful telephone call to Oneita Jolly to discuss Carelink alert received today for "battery status" nearing voltage for RRT. Hipaa compliant VM message left requesting call back to [phone]. Monthly remotes previously scheduled. Will await patient call back for notification.

## 2020-12-09 ENCOUNTER — Encounter: Payer: Self-pay | Admitting: *Deleted

## 2020-12-13 ENCOUNTER — Encounter: Payer: Self-pay | Admitting: Cardiovascular Disease

## 2020-12-20 ENCOUNTER — Telehealth: Payer: Self-pay | Admitting: Cardiovascular Disease

## 2020-12-20 NOTE — Telephone Encounter (Signed)
4.11.22 LMOM to sch fu/appt w/Dr Croitoru. LP

## 2020-12-27 ENCOUNTER — Encounter: Payer: Self-pay | Admitting: Cardiovascular Disease

## 2020-12-27 ENCOUNTER — Ambulatory Visit (INDEPENDENT_AMBULATORY_CARE_PROVIDER_SITE_OTHER): Payer: Medicare Other

## 2020-12-27 ENCOUNTER — Other Ambulatory Visit: Payer: Self-pay

## 2020-12-27 ENCOUNTER — Ambulatory Visit (INDEPENDENT_AMBULATORY_CARE_PROVIDER_SITE_OTHER): Payer: Medicare Other | Admitting: Cardiovascular Disease

## 2020-12-27 VITALS — BP 130/62 | HR 65 | Ht 71.0 in | Wt 211.4 lb

## 2020-12-27 DIAGNOSIS — Z952 Presence of prosthetic heart valve: Secondary | ICD-10-CM

## 2020-12-27 DIAGNOSIS — Z950 Presence of cardiac pacemaker: Secondary | ICD-10-CM

## 2020-12-27 DIAGNOSIS — R351 Nocturia: Secondary | ICD-10-CM

## 2020-12-27 DIAGNOSIS — Z125 Encounter for screening for malignant neoplasm of prostate: Secondary | ICD-10-CM

## 2020-12-27 DIAGNOSIS — I10 Essential (primary) hypertension: Secondary | ICD-10-CM

## 2020-12-27 DIAGNOSIS — I480 Paroxysmal atrial fibrillation: Secondary | ICD-10-CM

## 2020-12-27 DIAGNOSIS — E785 Hyperlipidemia, unspecified: Secondary | ICD-10-CM | POA: Diagnosis not present

## 2020-12-27 DIAGNOSIS — E119 Type 2 diabetes mellitus without complications: Secondary | ICD-10-CM

## 2020-12-27 DIAGNOSIS — I441 Atrioventricular block, second degree: Secondary | ICD-10-CM

## 2020-12-27 LAB — PACEMAKER DEVICE OBSERVATION

## 2020-12-27 MED ORDER — ELIQUIS 5 MG PO TABS: 5.0000 mg | ORAL_TABLET | Freq: Two times a day (BID) | ORAL | 3 refills | Status: DC

## 2020-12-27 MED ORDER — ELIQUIS 5 MG PO TABS: 5.0000 mg | ORAL_TABLET | Freq: Two times a day (BID) | ORAL | 11 refills | Status: DC

## 2020-12-27 NOTE — Progress Notes (Signed)
Patient ID: [name] [name], male   DOB: 19-Aug-1946, 75 y.o.   MRN: [id number]    Cardiology Office Note    Date:  12/27/2020   ID:  [name] [name], DOB 1946/07/01, MRN [id number]  PCP:  Patient, No Pcp Per (Inactive)  Cardiologist:   Sanda Klein, MD   Chief Complaint  Patient presents with  . Atrial Fibrillation    History of Present Illness:  [name] [name] is a 75 y.o. male who presents for follow-up for valvular heart disease and AV node conduction abnormalities with pacemaker implantation. He underwent aortic valve replacement in January 2012 (21 mm bioprosthesis) for symptomatic aortic stenosis. Early on, his prosthesis showed evidence of mild to moderate aortic insufficiency that has not progressed over time. He has preserved left ventricular systolic function. In November 2011 he received a dual-chamber permanent pacemaker for syncope that was likely related to second degree atrioventricular block. He has an MRI conditional Medtronic Revo device. 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  His most recent pacemaker download showed a 1 hour and 60-minute episode of atrial fibrillation with controlled ventricular response that occurred during expected sleep hours and was asymptomatic.  Previously, he had a 4-minute episode of atrial flutter that occurred about a year ago.  He also had postoperative atrial fibrillation when he had his aortic valve replacement approximately 10 years ago.  He does not have a history of stroke or TIA.  He is doing well.  He continues to exercise more during the summer months (about 5 hours a day) compared to the winter months (2 to 3 hours a day).  But he can do so without any cardiovascular complaints.  The patient specifically denies any chest pain at rest exertion, dyspnea at rest or with exertion, orthopnea, paroxysmal nocturnal dyspnea, syncope, palpitations, focal neurological  deficits, intermittent claudication, lower extremity edema, unexplained weight gain, cough, hemoptysis or wheezing.  Its been over a year since his last primary care physician visit.  He has not had any interval hospitalizations or emergency room visits and has not had any lab work performed either.  Most recent hemoglobin A1c was 5.9% in March 2021.  LDL cholesterol was 83 at that appointment.  Unfortunately, he has gained back some weight and he is now borderline obese with a BMI over 29.  Blood pressure control remains normal.  His pacemaker is functioning normally, but is approaching ERI (current voltage 2.83, RRT 2.81 V).  Lead parameters remain excellent.  He has 25% atrial pacing and only 0.5% ventricular pacing.  He has a very low burden of atrial fibrillation less than 0.1%, pretty much made up by the 77-minute episode that occurred in January.  He has not had any episodes of high ventricular rate.  He is seeing [name] for endocrinology follow-up in [place].     Past Medical History:  Diagnosis Date  . Aortic insufficiency   . Atrial fibrillation ([place])   . AV block, 2nd degree   . Diabetes ([place])   . Dyslipidemia   . Hypertension   . LAFB (left anterior fascicular block)   . Mitral insufficiency   . RBBB   . Ventricular tachycardia ([place])   . Ventricular tachycardia, non-sustained [facility])     Past Surgical History:  Procedure Laterality Date  . AORTIC VALVE REPLACEMENT  10/06/2010   [facility] Ease prosthesis  . BACK SURGERY    . CARDIAC CATHETERIZATION  06/30/2010   nonobstructive  CAD, heavily ca+ AOV  . NM MYOCAR PERF WALL MOTION  02/28/2007   normal  . PERMANENT PACEMAKER INSERTION  07/21/2010   Medtronic    Outpatient Medications Prior to Visit  Medication Sig Dispense Refill  . amLODipine (NORVASC) 5 MG tablet Take 1 tablet (5 mg total) by mouth daily. OFFICE VISIT NEEDED 90 tablet 0  . atorvastatin (LIPITOR) 40 MG tablet TAKE 1 TABLET DAILY 90 tablet 4  .  glipiZIDE (GLUCOTROL) 5 MG tablet TAKE ONE-HALF (1/2) TABLET EVERY MORNING AND 1 TABLET EVERY EVENING    . irbesartan (AVAPRO) 300 MG tablet TAKE 1 TABLET (300 MG TOTAL) BY MOUTH DAILY.    . metFORMIN (GLUCOPHAGE) 500 MG tablet TAKE 1/2 TABLET BY MOUTH EVERY MORNING AND 1 TABLET BY MOUTH EVERY EVENING    . metoprolol tartrate (LOPRESSOR) 50 MG tablet TAKE 1 TABLET TWICE A DAY 180 tablet 3  . aspirin EC 81 MG tablet Take 81 mg by mouth daily.     No facility-administered medications prior to visit.     Allergies:   Patient has no known allergies.   Social History   Socioeconomic History  . Marital status: Single    Spouse name: Not on file  . Number of children: Not on file  . Years of education: Not on file  . Highest education level: Not on file  Occupational History  . Not on file  Tobacco Use  . Smoking status: Never Smoker  . Smokeless tobacco: Never Used  Substance and Sexual Activity  . Alcohol use: No  . Drug use: No  . Sexual activity: Not on file  Other Topics Concern  . Not on file  Social History Narrative  . Not on file   Social Determinants of Health   Financial Resource Strain: Not on file  Food Insecurity: Not on file  Transportation Needs: Not on file  Physical Activity: Not on file  Stress: Not on file  Social Connections: Not on file     Family History:  The patient's brother recently underwent prostatectomy for prostate cancer  ROS:   Please see the history of present illness.    ROS All other systems are reviewed and are negative.   PHYSICAL EXAM:   VS:  BP 130/62   Pulse 65   Ht 5\' 11"  (1.803 m)   Wt 211 lb 6.4 oz (95.9 kg)   BMI 29.48 kg/m      General: Alert, oriented x3, no distress, and obese.  Well-healed left subclavian pacemaker site Head: no evidence of trauma, PERRL, EOMI, no exophtalmos or lid lag, no myxedema, no xanthelasma; normal ears, nose and oropharynx Neck: normal jugular venous pulsations and no hepatojugular reflux;  brisk carotid pulses without delay and no carotid bruits Chest: clear to auscultation, no signs of consolidation by percussion or palpation, normal fremitus, symmetrical and full respiratory excursions Cardiovascular: normal position and quality of the apical impulse, regular rhythm, normal first and widely split second heart sounds, 2-3/6 early peaking systolic ejection murmur in the aortic focus, barely audible decrease in the diastolic aortic murmur, rubs or gallops Abdomen: no tenderness or distention, no masses by palpation, no abnormal pulsatility or arterial bruits, normal bowel sounds, no hepatosplenomegaly Extremities: no clubbing, cyanosis or edema; 2+ radial, ulnar and brachial pulses bilaterally; 2+ right femoral, posterior tibial and dorsalis pedis pulses; 2+ left femoral, posterior tibial and dorsalis pedis pulses; no subclavian or femoral bruits Neurological: grossly nonfocal Psych: Normal mood and affect   Wt Readings  from Last 3 Encounters:  12/27/20 211 lb 6.4 oz (95.9 kg)  12/08/19 187 lb (84.8 kg)  03/21/18 210 lb 12.8 oz (95.6 kg)      Studies/Labs Reviewed:  Echocardiogram 12/25/2019 1. Left ventricular ejection fraction, by estimation, is 60 to 65%. The  left ventricle has normal function. Left ventricular endocardial border  not optimally defined to evaluate regional wall motion. There is moderate  asymmetric left ventricular  hypertrophy of the basal-septal segment. Left ventricular diastolic  parameters are consistent with Grade I diastolic dysfunction (impaired  relaxation).  2. Right ventricular systolic function is mildly reduced. The right  ventricular size is mildly enlarged.  3. Left atrial size was moderately dilated.  4. The mitral valve is degenerative. Mild mitral valve regurgitation. No  evidence of mitral stenosis.  5. The aortic valve has been repaired/replaced. Aortic valve  regurgitation is mild. There is a 21 mm Magna pericardial valve  present in  the aortic position. Procedure Date: 10/06/2010. Aortic valve mean  gradient measures 11.0 mmHg.  6. Aortic dilatation noted. There is mild dilatation of the ascending  aorta measuring 40 mm.   Comparison(s): A prior study was performed on 11/29/2016. No significant  change from prior study. Prior images reviewed side by side.   EKG:  EKG is ordered today.  It is very similar to previous tracing and shows sinus rhythm with trifascicular block (PR interval 250 ms, RBBB plus LAFB), no ischemic repolarization abnormalities, QTC 447 ms.  Recent Labs: May 2017 Glucose 126, TSH 3.79, creatinine 0.96, normal liver function tests, hemoglobin 17.3 Total cholesterol 134, triglycerides 120, LDL 84, HDL 34   Lipid Panel    Component Value Date/Time   CHOL 137 12/08/2019 0901   TRIG 77 12/08/2019 0901   HDL 39 (L) 12/08/2019 0901   CHOLHDL 3.5 12/08/2019 0901   CHOLHDL 4.3 11/28/2016 0919   VLDL 23 11/28/2016 0919   LDLCALC 83 12/08/2019 0901   03 29 2021 Cholesterol 137, HDL 39, LDL 83, triglycerides 77 Hemoglobin A1c 5.9% Creatinine 0.86, potassium 4.8, normal liver function tests  ASSESSMENT:    1. Paroxysmal atrial fibrillation (HCC)   2. Second degree AV block, Mobitz type I   3. Pacemaker- Medtronic Revo, Nov 2011   4. History of aortic valve replacement   5. Essential hypertension   6. Type 2 diabetes mellitus without complication, without long-term current use of insulin (HCC)   7. Dyslipidemia   8. Nocturia    9. Screening for prostate cancer      PLAN:  In order of problems listed above:   1. PAF: He had postoperative atrial fibrillation and had the 4-minute episode of atrial flutter in February 2020, now with a clear episode of atrial fibrillation lasting for more than 1 hour.  Reviewed the purpose of oral anticoagulation and the risk benefit ratio.  Stop aspirin and start Eliquis 5 mg twice daily.  CHA2DS2-VASc 4 (age 70, hypertension, diabetes) he has no  history of embolic events.  Continue monitoring via his pacemaker. 2. Second degree AV block: Also with right bundle branch block and left anterior fascicular block, but continues to require very little ventricular pacing. 3. PPM: Anticipate need for pacemaker generator change in the next 3-6 months. 4. S/P AVR with unexpected degree of aortic insufficiency, but this has been stable over time.  Most recent echo performed a year ago showed no change in aortic insufficiency severity.  Reevaluation in another 2-3 years 5. HTN: Well treated.  Continue  ARB which has the added benefit of renal protection and diabetes.  Avoid additional weight gain. 6. DM: Recheck hemoglobin A1c.  Pointed out his weight gain and the fact that this will worsen glycemic control. 7. HLP: Recheck labs today.  Chronically low HDL consistent with metabolic syndrome 8. Check PSA today for complaints of nocturia.  Medication Adjustments/Labs and Tests Ordered: Current medicines are reviewed at length with the patient today.  Concerns regarding medicines are outlined above.  Medication changes, Labs and Tests ordered today are listed in the Patient Instructions below. Patient Instructions  Medication Instructions:  STOP the Aspirin START Eliquis 5 mg twice daily *If you need a refill on your cardiac medications before your next appointment, please call your pharmacy*   Lab Work: Your provider would like for you to have the following labs today: CBC, CMET, Lipid, A1C and PSA  If you have labs (blood work) drawn today and your tests are completely normal, you will receive your results only by: [name] MyChart Message (if you have MyChart) OR . A paper copy in the mail If you have any lab test that is abnormal or we need to change your treatment, we will call you to review the results.   Testing/Procedures: None ordered   Follow-Up: At [facility], you and your health needs are our priority.  As part of our continuing  mission to provide you with exceptional heart care, we have created designated Provider Care Teams.  These Care Teams include your primary Cardiologist (physician) and Advanced Practice Providers (APPs -  Physician Assistants and Nurse Practitioners) who all work together to provide you with the care you need, when you need it.  We recommend signing up for the patient portal called "MyChart".  Sign up information is provided on this After Visit Summary.  MyChart is used to connect with patients for Virtual Visits (Telemedicine).  Patients are able to view lab/test results, encounter notes, upcoming appointments, etc.  Non-urgent messages can be sent to your provider as well.   To learn more about what you can do with MyChart, go to [email or web address].    Your next appointment:   6 month(s)  The format for your next appointment:   In Person  Provider:   Sanda Klein, MD        Signed, Sanda Klein, MD  12/27/2020 1:28 PM    [facility] [address], [place], [place]  [address] Phone: [phone]; Fax: [phone]

## 2020-12-27 NOTE — Patient Instructions (Signed)
Medication Instructions:  STOP the Aspirin START Eliquis 5 mg twice daily *If you need a refill on your cardiac medications before your next appointment, please call your pharmacy*   Lab Work: Your provider would like for you to have the following labs today: CBC, CMET, Lipid, A1C and PSA  If you have labs (blood work) drawn today and your tests are completely normal, you will receive your results only by: [name] MyChart Message (if you have MyChart) OR . A paper copy in the mail If you have any lab test that is abnormal or we need to change your treatment, we will call you to review the results.   Testing/Procedures: None ordered   Follow-Up: At [facility], you and your health needs are our priority.  As part of our continuing mission to provide you with exceptional heart care, we have created designated Provider Care Teams.  These Care Teams include your primary Cardiologist (physician) and Advanced Practice Providers (APPs -  Physician Assistants and Nurse Practitioners) who all work together to provide you with the care you need, when you need it.  We recommend signing up for the patient portal called "MyChart".  Sign up information is provided on this After Visit Summary.  MyChart is used to connect with patients for Virtual Visits (Telemedicine).  Patients are able to view lab/test results, encounter notes, upcoming appointments, etc.  Non-urgent messages can be sent to your provider as well.   To learn more about what you can do with MyChart, go to [email or web address].    Your next appointment:   6 month(s)  The format for your next appointment:   In Person  Provider:   Sanda Klein, MD

## 2020-12-28 LAB — COMPREHENSIVE METABOLIC PANEL
ALT: 22 IU/L (ref 0–44)
AST: 23 IU/L (ref 0–40)
Albumin/Globulin Ratio: 1.8 (ref 1.2–2.2)
Albumin: 4.4 g/dL (ref 3.7–4.7)
Alkaline Phosphatase: 76 IU/L (ref 44–121)
BUN/Creatinine Ratio: 13 (ref 10–24)
BUN: 12 mg/dL (ref 8–27)
Bilirubin Total: 0.9 mg/dL (ref 0.0–1.2)
CO2: 22 mmol/L (ref 20–29)
Calcium: 9.6 mg/dL (ref 8.6–10.2)
Chloride: 102 mmol/L (ref 96–106)
Creatinine, Ser: 0.95 mg/dL (ref 0.76–1.27)
Globulin, Total: 2.5 g/dL (ref 1.5–4.5)
Glucose: 88 mg/dL (ref 65–99)
Potassium: 4.6 mmol/L (ref 3.5–5.2)
Sodium: 142 mmol/L (ref 134–144)
Total Protein: 6.9 g/dL (ref 6.0–8.5)
eGFR: 83 mL/min/{1.73_m2} (ref >59)

## 2020-12-28 LAB — LIPID PANEL
Chol/HDL Ratio: 3.9 ratio (ref 0.0–5.0)
Cholesterol, Total: 142 mg/dL (ref 100–199)
HDL: 36 mg/dL — ABNORMAL LOW (ref >39)
LDL Chol Calc (NIH): 86 mg/dL (ref 0–99)
Triglycerides: 108 mg/dL (ref 0–149)
VLDL Cholesterol Cal: 20 mg/dL (ref 5–40)

## 2020-12-28 LAB — CBC
Hematocrit: 47 % (ref 37.5–51.0)
Hemoglobin: 16.9 g/dL (ref 13.0–17.7)
MCH: 31.6 pg (ref 26.6–33.0)
MCHC: 36 g/dL — ABNORMAL HIGH (ref 31.5–35.7)
MCV: 88 fL (ref 79–97)
Platelets: 232 10*3/uL (ref 150–450)
RBC: 5.35 x10E6/uL (ref 4.14–5.80)
RDW: 12.4 % (ref 11.6–15.4)
WBC: 6.9 10*3/uL (ref 3.4–10.8)

## 2020-12-28 LAB — HEMOGLOBIN A1C
Est. average glucose Bld gHb Est-mCnc: 128 mg/dL
Hgb A1c MFr Bld: 6.1 % — ABNORMAL HIGH (ref 4.8–5.6)

## 2020-12-28 LAB — PSA: Prostate Specific Ag, Serum: 12.8 ng/mL — ABNORMAL HIGH (ref 0.0–4.0)

## 2020-12-29 LAB — CUP PACEART REMOTE DEVICE CHECK
Battery Voltage: 2.83 V
Brady Statistic AP VP Percent: 0.18 %
Brady Statistic AP VS Percent: 14.31 %
Brady Statistic AS VP Percent: 0.03 %
Brady Statistic AS VS Percent: 85.48 %
Brady Statistic RA Percent Paced: 14.44 %
Brady Statistic RV Percent Paced: 0.22 %
Date Time Interrogation Session: 20220418063206
Implantable Lead Implant Date: 20111110
Implantable Lead Implant Date: 20111110
Implantable Lead Location: 753859
Implantable Lead Location: 753860
Implantable Pulse Generator Implant Date: 20111110
Lead Channel Impedance Value: 408 Ohm
Lead Channel Impedance Value: 592 Ohm
Lead Channel Sensing Intrinsic Amplitude: 1.768 mV
Lead Channel Sensing Intrinsic Amplitude: 2.729 mV
Lead Channel Setting Pacing Amplitude: 2 V
Lead Channel Setting Pacing Amplitude: 2.5 V
Lead Channel Setting Pacing Pulse Width: 0.4 ms
Lead Channel Setting Sensing Sensitivity: 0.6 mV

## 2021-01-06 ENCOUNTER — Encounter: Payer: Self-pay | Admitting: *Deleted

## 2021-01-13 NOTE — Progress Notes (Signed)
Remote pacemaker transmission.   

## 2021-01-13 NOTE — Addendum Note (Signed)
Addended by: Cheri Kearns A on: 01/13/2021 09:36 AM   Modules accepted: Level of Service

## 2021-01-27 ENCOUNTER — Ambulatory Visit (INDEPENDENT_AMBULATORY_CARE_PROVIDER_SITE_OTHER): Payer: Medicare Other

## 2021-01-27 DIAGNOSIS — I441 Atrioventricular block, second degree: Secondary | ICD-10-CM

## 2021-01-31 LAB — CUP PACEART REMOTE DEVICE CHECK
Battery Voltage: 2.82 V
Brady Statistic AP VP Percent: 0.83 %
Brady Statistic AP VS Percent: 23.4 %
Brady Statistic AS VP Percent: 0.18 %
Brady Statistic AS VS Percent: 75.59 %
Brady Statistic RA Percent Paced: 23.84 %
Brady Statistic RV Percent Paced: 1.05 %
Date Time Interrogation Session: 20220519070356
Implantable Lead Implant Date: 20111110
Implantable Lead Implant Date: 20111110
Implantable Lead Location: 753859
Implantable Lead Location: 753860
Implantable Pulse Generator Implant Date: 20111110
Lead Channel Impedance Value: 408 Ohm
Lead Channel Impedance Value: 600 Ohm
Lead Channel Sensing Intrinsic Amplitude: 1.682 mV
Lead Channel Sensing Intrinsic Amplitude: 2.729 mV
Lead Channel Setting Pacing Amplitude: 2 V
Lead Channel Setting Pacing Amplitude: 2.5 V
Lead Channel Setting Pacing Pulse Width: 0.4 ms
Lead Channel Setting Sensing Sensitivity: 0.6 mV

## 2021-02-15 ENCOUNTER — Other Ambulatory Visit: Payer: Self-pay | Admitting: Cardiovascular Disease

## 2021-02-18 NOTE — Progress Notes (Signed)
Remote pacemaker transmission.   

## 2021-02-18 NOTE — Addendum Note (Signed)
Addended by: Cheri Kearns A on: 02/18/2021 11:25 AM   Modules accepted: Level of Service

## 2021-02-28 ENCOUNTER — Ambulatory Visit (INDEPENDENT_AMBULATORY_CARE_PROVIDER_SITE_OTHER): Payer: Medicare Other

## 2021-02-28 DIAGNOSIS — I441 Atrioventricular block, second degree: Secondary | ICD-10-CM

## 2021-02-28 LAB — CUP PACEART REMOTE DEVICE CHECK
Battery Voltage: 2.82 V
Brady Statistic AP VP Percent: 0.99 %
Brady Statistic AP VS Percent: 27.59 %
Brady Statistic AS VP Percent: 0.19 %
Brady Statistic AS VS Percent: 71.23 %
Brady Statistic RA Percent Paced: 27.93 %
Brady Statistic RV Percent Paced: 1.2 %
Date Time Interrogation Session: 20220620073155
Implantable Lead Implant Date: 20111110
Implantable Lead Implant Date: 20111110
Implantable Lead Location: 753859
Implantable Lead Location: 753860
Implantable Pulse Generator Implant Date: 20111110
Lead Channel Impedance Value: 400 Ohm
Lead Channel Impedance Value: 608 Ohm
Lead Channel Sensing Intrinsic Amplitude: 1.897 mV
Lead Channel Sensing Intrinsic Amplitude: 2.729 mV
Lead Channel Setting Pacing Amplitude: 2 V
Lead Channel Setting Pacing Amplitude: 2.5 V
Lead Channel Setting Pacing Pulse Width: 0.4 ms
Lead Channel Setting Sensing Sensitivity: 0.6 mV

## 2021-03-21 NOTE — Progress Notes (Signed)
Remote pacemaker transmission.   

## 2021-03-31 ENCOUNTER — Telehealth: Payer: Self-pay

## 2021-03-31 ENCOUNTER — Ambulatory Visit (INDEPENDENT_AMBULATORY_CARE_PROVIDER_SITE_OTHER): Payer: Medicare Other

## 2021-03-31 DIAGNOSIS — I441 Atrioventricular block, second degree: Secondary | ICD-10-CM

## 2021-03-31 LAB — CUP PACEART REMOTE DEVICE CHECK
Battery Voltage: 2.81 V
Brady Statistic AP VP Percent: 0.71 %
Brady Statistic AP VS Percent: 19.81 %
Brady Statistic AS VP Percent: 10.86 %
Brady Statistic AS VS Percent: 68.63 %
Brady Statistic RA Percent Paced: 19.83 %
Brady Statistic RV Percent Paced: 11.76 %
Date Time Interrogation Session: 20220721081121
Implantable Lead Implant Date: 20111110
Implantable Lead Implant Date: 20111110
Implantable Lead Location: 753859
Implantable Lead Location: 753860
Implantable Pulse Generator Implant Date: 20111110
Lead Channel Impedance Value: 400 Ohm
Lead Channel Impedance Value: 600 Ohm
Lead Channel Sensing Intrinsic Amplitude: 1.854 mV
Lead Channel Sensing Intrinsic Amplitude: 3.07 mV
Lead Channel Setting Pacing Amplitude: 2.5 V
Lead Channel Setting Pacing Pulse Width: 0.4 ms
Lead Channel Setting Sensing Sensitivity: 0.6 mV

## 2021-03-31 NOTE — Telephone Encounter (Signed)
Pacemaker has reached RRT 03/21/21. Attempted to contact patient to advise and inform in need of device clinic apt. With Dr. Sallyanne Kuster.   No answer, LMTCB.

## 2021-03-31 NOTE — Telephone Encounter (Signed)
Dr. Loletha Grayer does not have any regular appts until 05/30/21. Lattie Haw can I use an Acute DOD spot?

## 2021-03-31 NOTE — Telephone Encounter (Signed)
Patient returned phone call. Informed patient his device has reached RRT as of 03/21/21. Advised a scheduler will contact him to set up apt. With Dr. Sallyanne Kuster in office to discuss gen change. Patient agreeable to plan.

## 2021-04-01 NOTE — Telephone Encounter (Signed)
Appointment for 05/02/21 with Dr. Sallyanne Kuster

## 2021-04-22 NOTE — Progress Notes (Signed)
Remote pacemaker transmission.   

## 2021-05-02 ENCOUNTER — Other Ambulatory Visit: Payer: Self-pay

## 2021-05-02 ENCOUNTER — Ambulatory Visit (INDEPENDENT_AMBULATORY_CARE_PROVIDER_SITE_OTHER): Payer: Medicare Other

## 2021-05-02 ENCOUNTER — Encounter: Payer: Self-pay | Admitting: Cardiovascular Disease

## 2021-05-02 ENCOUNTER — Ambulatory Visit (INDEPENDENT_AMBULATORY_CARE_PROVIDER_SITE_OTHER): Payer: Medicare Other | Admitting: Cardiovascular Disease

## 2021-05-02 VITALS — BP 128/66 | HR 65 | Ht 71.0 in | Wt 208.8 lb

## 2021-05-02 DIAGNOSIS — I10 Essential (primary) hypertension: Secondary | ICD-10-CM

## 2021-05-02 DIAGNOSIS — E785 Hyperlipidemia, unspecified: Secondary | ICD-10-CM

## 2021-05-02 DIAGNOSIS — Z7901 Long term (current) use of anticoagulants: Secondary | ICD-10-CM

## 2021-05-02 DIAGNOSIS — Z45010 Encounter for checking and testing of cardiac pacemaker pulse generator [battery]: Secondary | ICD-10-CM

## 2021-05-02 DIAGNOSIS — I441 Atrioventricular block, second degree: Secondary | ICD-10-CM

## 2021-05-02 DIAGNOSIS — I480 Paroxysmal atrial fibrillation: Secondary | ICD-10-CM

## 2021-05-02 DIAGNOSIS — E119 Type 2 diabetes mellitus without complications: Secondary | ICD-10-CM

## 2021-05-02 DIAGNOSIS — Z952 Presence of prosthetic heart valve: Secondary | ICD-10-CM

## 2021-05-02 DIAGNOSIS — Z01818 Encounter for other preprocedural examination: Secondary | ICD-10-CM | POA: Diagnosis not present

## 2021-05-02 NOTE — Progress Notes (Signed)
Patient ID: [name] [name], male   DOB: 03/27/1946, 75 y.o.   MRN: [id number]    Cardiology Office Note    Date:  05/02/2021   ID:  [name] [name], DOB June 26, 1946, MRN [id number]  PCP:  Patient, No Pcp Per (Inactive)  Cardiologist:   Sanda Klein, MD   Chief Complaint  Patient presents with   Pacemaker Problem    History of Present Illness:  [name] [name] is a 75 y.o. 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   He underwent aortic valve replacement in January 2012 (21 mm bioprosthesis) for symptomatic aortic stenosis. Early on, his prosthesis showed evidence of mild to moderate aortic insufficiency that has not progressed over time. He has preserved left ventricular systolic function.  He did have postoperative atrial fibrillation.  In November 2011 he received a dual-chamber permanent pacemaker for syncope that was likely related to second degree atrioventricular block. He has an MRI conditional Medtronic Revo device. 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  His pacemaker reached recommended replacement trigger on March 21, 2021 and switched to VVI mode at 65 bpm.  We have had a hard time getting him on the phone, but thankfully he has not been symptomatic with the mode switch.  He presents today with ventricular paced rhythm and retrograde atrial activation, unaware of any change.  Otherwise lead parameters are normal.  He has 18% atrial pacing and 13% ventricular pacing.  The heart rate histogram distribution is normal.  His device showed an episode of fibrillation with controlled ventricular response lasting for about 77 minutes, earlier this year.  In 2021 he had a 4-minute episode of atrial flutter.  In 2012 he had postoperative atrial fibrillation at the time of AVR.  He does not have a history of  stroke, TIA or embolic events.  He admits to being less physically active recently, which has led to some weight gain.  He denies any exertional dyspnea or angina and has not had palpitations, dizziness or syncope.  He does not have complaints of lower extremity edema, although a little bit of pitting edema is detected today on physical exam.  He denies orthopnea, PND, intermittent claudication or any focal neurological events.  Metabolic parameters are well controlled with a hemoglobin A1c of 6.1% and an LDL cholesterol of 86 earlier this year.  He is seeing [name] for endocrinology follow-up in [place].     Past Medical History:  Diagnosis Date   Aortic insufficiency    Atrial fibrillation (HCC)    AV block, 2nd degree    Diabetes ([place])    Dyslipidemia    Hypertension    LAFB (left anterior fascicular block)    Mitral insufficiency    RBBB    Ventricular tachycardia (HCC)    Ventricular tachycardia, non-sustained [facility])     Past Surgical History:  Procedure Laterality Date   AORTIC VALVE REPLACEMENT  10/06/2010   Edwards Magna Ease prosthesis   BACK SURGERY     CARDIAC CATHETERIZATION  06/30/2010   nonobstructive CAD, heavily ca+ AOV   NM MYOCAR PERF WALL MOTION  02/28/2007   normal   PERMANENT PACEMAKER INSERTION  07/21/2010   Medtronic    Outpatient Medications Prior to Visit  Medication Sig Dispense Refill   amLODipine (NORVASC) 5 MG tablet Take 1 tablet (5 mg total) by mouth daily. OFFICE VISIT  NEEDED 90 tablet 0   apixaban (ELIQUIS) 5 MG TABS tablet Take 1 tablet (5 mg total) by mouth 2 (two) times daily. 180 tablet 3   atorvastatin (LIPITOR) 40 MG tablet TAKE 1 TABLET DAILY 90 tablet 4   glipiZIDE (GLUCOTROL) 5 MG tablet TAKE ONE-HALF (1/2) TABLET EVERY MORNING AND 1 TABLET EVERY EVENING     irbesartan (AVAPRO) 300 MG tablet TAKE 1 TABLET (300 MG TOTAL) BY MOUTH DAILY.     metFORMIN (GLUCOPHAGE) 500 MG tablet TAKE 1/2 TABLET BY MOUTH EVERY MORNING AND 1 TABLET BY  MOUTH EVERY EVENING     metoprolol tartrate (LOPRESSOR) 50 MG tablet TAKE 1 TABLET TWICE A DAY 180 tablet 3   No facility-administered medications prior to visit.     Allergies:   Patient has no known allergies.   Social History   Socioeconomic History   Marital status: Single    Spouse name: Not on file   Number of children: Not on file   Years of education: Not on file   Highest education level: Not on file  Occupational History   Not on file  Tobacco Use   Smoking status: Never   Smokeless tobacco: Never  Substance and Sexual Activity   Alcohol use: No   Drug use: No   Sexual activity: Not on file  Other Topics Concern   Not on file  Social History Narrative   Not on file   Social Determinants of Health   Financial Resource Strain: Not on file  Food Insecurity: Not on file  Transportation Needs: Not on file  Physical Activity: Not on file  Stress: Not on file  Social Connections: Not on file     Family History:  The patient's brother recently underwent prostatectomy for prostate cancer  ROS:   Please see the history of present illness.    ROS All other systems are reviewed and are negative.   PHYSICAL EXAM:   VS:  BP 128/66 (BP Location: Left Arm, Patient Position: Sitting, Cuff Size: Large)   Pulse 65   Ht '5\' 11"'$  (1.803 m)   Wt 208 lb 12.8 oz (94.7 kg)   SpO2 98%   BMI 29.12 kg/m     General: Alert, oriented x3, no distress, overweight Head: no evidence of trauma, PERRL, EOMI, no exophtalmos or lid lag, no myxedema, no xanthelasma; normal ears, nose and oropharynx Neck: normal jugular venous pulsations and no hepatojugular reflux; brisk carotid pulses without delay and no carotid bruits Chest: clear to auscultation, no signs of consolidation by percussion or palpation, normal fremitus, symmetrical and full respiratory excursions Cardiovascular: normal position and quality of the apical impulse, regular rhythm, normal first and widely split second heart  sounds, 2/6 early peaking aortic ejection murmur and 1/6 decrescendo diastolic murmur at the right lower sternal border, no apical murmurs, rubs or gallops Abdomen: no tenderness or distention, no masses by palpation, no abnormal pulsatility or arterial bruits, normal bowel sounds, no hepatosplenomegaly Extremities: no clubbing, cyanosis; 1+ symmetrical pitting edema of the ankles; 2+ radial, ulnar and brachial pulses bilaterally; 2+ right femoral, posterior tibial and dorsalis pedis pulses; 2+ left femoral, posterior tibial and dorsalis pedis pulses; no subclavian or femoral bruits Neurological: grossly nonfocal Psych: Normal mood and affect   Wt Readings from Last 3 Encounters:  05/02/21 208 lb 12.8 oz (94.7 kg)  12/27/20 211 lb 6.4 oz (95.9 kg)  12/08/19 187 lb (84.8 kg)      Studies/Labs Reviewed:  Echocardiogram 12/25/2019  1.  Left ventricular ejection fraction, by estimation, is 60 to 65%. The  left ventricle has normal function. Left ventricular endocardial border  not optimally defined to evaluate regional wall motion. There is moderate  asymmetric left ventricular  hypertrophy of the basal-septal segment. Left ventricular diastolic  parameters are consistent with Grade I diastolic dysfunction (impaired  relaxation).   2. Right ventricular systolic function is mildly reduced. The right  ventricular size is mildly enlarged.   3. Left atrial size was moderately dilated.   4. The mitral valve is degenerative. Mild mitral valve regurgitation. No  evidence of mitral stenosis.   5. The aortic valve has been repaired/replaced. Aortic valve  regurgitation is mild. There is a 21 mm Magna pericardial valve present in  the aortic position. Procedure Date: 10/06/2010. Aortic valve mean  gradient measures 11.0 mmHg.   6. Aortic dilatation noted. There is mild dilatation of the ascending  aorta measuring 40 mm.   Comparison(s): A prior study was performed on 11/29/2016. No significant   change from prior study. Prior images reviewed side by side.   EKG:  EKG is ordered today.  It shows asynchronous ventricular pacing at 65 bpm.  The QRS is quite broad and the QTC is 530 ms.  Prior to his device reaching RRT his ECG showed sinus rhythm with trifascicular block (PR interval 250 ms, RBBB plus LAFB), no ischemic repolarization abnormalities, QTC 447 ms.  Recent Labs: May 2017 Glucose 126, TSH 3.79, creatinine 0.96, normal liver function tests, hemoglobin 17.3 Total cholesterol 134, triglycerides 120, LDL 84, HDL 34   Lipid Panel    Component Value Date/Time   CHOL 142 12/27/2020 1151   TRIG 108 12/27/2020 1151   HDL 36 (L) 12/27/2020 1151   CHOLHDL 3.9 12/27/2020 1151   CHOLHDL 4.3 11/28/2016 0919   VLDL 23 11/28/2016 0919   LDLCALC 86 12/27/2020 1151   03 29 2021 Cholesterol 137, HDL 39, LDL 83, triglycerides 77 Hemoglobin A1c 5.9% Creatinine 0.86, potassium 4.8, normal liver function tests  12/27/2020 Cholesterol 142, HDL 36, LDL 86, triglycerides 108 Hemoglobin A1c 6.1% Creatinine 0.95, potassium 4.6, normal liver function tests ASSESSMENT:    1. Paroxysmal atrial fibrillation (HCC)   2. Second degree AV block, Mobitz type I   3. Pacemaker battery depletion   4. Long term current use of anticoagulant   5. History of aortic valve replacement   6. Essential hypertension   7. Type 2 diabetes mellitus without complication, without long-term current use of insulin ([place])   8. Pre-op testing   9. Dyslipidemia (high LDL; low HDL)      PLAN:  In order of problems listed above:   PAF: Has had 3 episodes of atrial tachyarrhythmia, most recently an episode of atrial fibrillation controlled ventricular response lasting more than an hour in early 2022.  CHA2DS2-VASc 4 (age 47, hypertension, diabetes) he has no history of embolic events.  On anticoagulation. Second degree AV block: Has bifascicular block, but has traditionally required very little ventricular pacing  (recent increase in V pacing is related to device reaching RRT and mode switch to VVI). PPM'@ERI'$ : He is not pacemaker dependent.  We will schedule for generator change out.This procedure has been fully reviewed with the patient and written informed consent has been obtained.  Device was switched back to [id number]-[id number] until we can perform generator change out, to promote AV synchrony and minimize chances that he will develop symptoms. Anticoagulation: Tolerating Eliquis without any bleeding problems. S/P AVR with unexpected degree  of aortic insufficiency, but this has been stable over time.  Most recent echo performed a year ago showed no change in aortic insufficiency severity.  Reevaluation in another 2-3 years HTN: Good control.  Continue ARB for renal protection and beta-blocker for rate control in case of breakthrough arrhythmia.  Amlodipine seems to be causing a little bit of ankle swelling, but this is tolerable. DM: A1c remains acceptable, although it has deteriorated from a year ago.  Reminded him of the importance of weight loss, regular exercise, compliance with a healthy, carbohydrate restricted diet. HLP: Low HDL unlikely to improve without significant weight loss and increasing physical activity.  Continue statin.   Medication Adjustments/Labs and Tests Ordered: Current medicines are reviewed at length with the patient today.  Concerns regarding medicines are outlined above.  Medication changes, Labs and Tests ordered today are listed in the Patient Instructions below. Patient Instructions  Medication Instructions: Your physician recommends that you continue on your current medications as directed. Please refer to the Current Medication list given to you today.  * If you need a refill on your cardiac medications before your next appointment, please call your pharmacy. *  Labwork: Pre procedure lab work today: BMET & CBC  * Will notify you of abnormal results, otherwise continue current  treatment plan.*  Testing/Procedures: Your physician has recommended that you have a pacemaker/defibrillator generator change (battery change). Please follow the instructions below, located under the special instructions section.  Follow-Up: Your physician recommends that you schedule a wound check appointment 10-14 days, after your procedure on 05/23/21, with the device clinic.  Your physician recommends that you schedule a follow up appointment in 91 days, after your procedure on 05/23/21, with Dr. Sallyanne Kuster.  Thank you for choosing [facility]!!      Any Other Special Instructions Will Be Listed Below (If Applicable).    [place] at [place], [place]  [place], [place] [address]  Phone: [phone] Fax: [phone]    Generator Change Procedure Instructions  You are scheduled for a Generator Change (battery change) on  05/23/21  with Dr. Sallyanne Kuster.  1. Please arrive at the [facility], Entrance "A"  at [facility] at  1:30pm on the day of your procedure. (The address is [address])  2. DIET: You may have a light, early breakfast the morning of your procedure. NOTHING TO EAT AFTER 8:00 AM.  3. LABS: Completed 05/02/21  4. MEDICATIONS: Hold the Eliquis on Sunday 9/11 and the morning of the procedure on 05/23/21  5.  Plan for an overnight stay.  Bring your insurance cards and a list of you medications.  6.  Wash your chest and neck with surgical scrub the evening before and the morning of your procedure.  Rinse well. Please review the surgical scrub instruction sheet given to you.   7. Your chest will need to be shaved prior to this procedure (if needed). We ask that you do this yourself at home 1 to 2 days before or if uncomfortable/unable to do yourself, then it will be performed by the hospital staff the day of.  * Special note:  Every effort is made to have your procedure done on time.  Occasionally there  are emergencies that present themselves at the hospital that may cause delays.  Please be patient if a delay does occur.                                                                                                           *  If you have any questions after you get home, please call Lattie Haw, RN at [phone].    [place] - Preparing For Surgery  Before surgery, you can play an important role. Because skin is not sterile, your skin needs to be as free of germs as possible. You can reduce the number of germs on your skin by washing with CHG (chlorahexidine gluconate) Soap before surgery.  CHG is an antiseptic cleaner which kills germs and bonds with the skin to continue killing germs even after washing.   Please do not use if you have an allergy to CHG or antibacterial soaps.  If your skin becomes reddened/irritated stop using the CHG.   Do not shave (including legs and underarms) for at least 48 hours prior to first CHG shower.  It is OK to shave your face.  Please follow these instructions carefully:  1.  Shower the night before surgery and the morning of surgery with CHG.  2.  If you choose to wash your hair, wash your hair first as usual with your normal shampoo.  3.  After you shampoo, rinse your hair and body thoroughly to remove the shampoo.  4.  Use CHG as you would any other liquid soap.  You can apply CHG directly to the skin and wash gently with a clean washcloth. 5.  Apply the CHG Soap to your body ONLY FROM THE NECK DOWN.  Do not use on open wounds or open sores.  Avoid contact with your eyes, ears, mouth and genitals (private parts).    6.  Wash thoroughly, paying special attention to the area where your surgery will be performed.  7.  Thoroughly rinse your body with warm water from the neck down.   8.  DO NOT shower/wash with your normal soap after using and rinsing off the CHG soap.  9.  Pat yourself dry with a clean towel.   10.  Wear clean pajamas.   11.  Place clean  sheets on your bed the night of your first shower and do not sleep with pets.  Day of Surgery: Do not apply any deodorants/lotions.  Please wear clean clothes to the hospital/surgery center.      Signed, Sanda Klein, MD  05/02/2021 [facility] [place], [place], [place]  [address] Phone: [phone]; Fax: [phone]

## 2021-05-02 NOTE — Patient Instructions (Signed)
Medication Instructions: Your physician recommends that you continue on your current medications as directed. Please refer to the Current Medication list given to you today.  * If you need a refill on your cardiac medications before your next appointment, please call your pharmacy. *  Labwork: Pre procedure lab work today: BMET & CBC  * Will notify you of abnormal results, otherwise continue current treatment plan.*  Testing/Procedures: Your physician has recommended that you have a pacemaker/defibrillator generator change (battery change). Please follow the instructions below, located under the special instructions section.  Follow-Up: Your physician recommends that you schedule a wound check appointment 10-14 days, after your procedure on 05/23/21, with the device clinic.  Your physician recommends that you schedule a follow up appointment in 91 days, after your procedure on 05/23/21, with Dr. Sallyanne Kuster.  Thank you for choosing [facility]!!      Any Other Special Instructions Will Be Listed Below (If Applicable).    [place] at [place], [place]  [place], [place] [address]  Phone: [phone] Fax: [phone]    Generator Change Procedure Instructions  You are scheduled for a Generator Change (battery change) on  05/23/21  with Dr. Sallyanne Kuster.  1. Please arrive at the [facility], Entrance "A"  at [facility] at  1:30pm on the day of your procedure. (The address is [address]  2. DIET: You may have a light, early breakfast the morning of your procedure. NOTHING TO EAT AFTER 8:00 AM.  3. LABS: Completed 05/02/21  4. MEDICATIONS: Hold the Eliquis on Sunday 9/11 and the morning of the procedure on 05/23/21  5.  Plan for an overnight stay.  Bring your insurance cards and a list of you medications.  6.  Wash your chest and neck with surgical scrub the evening before and the morning of your procedure.  Rinse  well. Please review the surgical scrub instruction sheet given to you.   7. Your chest will need to be shaved prior to this procedure (if needed). We ask that you do this yourself at home 1 to 2 days before or if uncomfortable/unable to do yourself, then it will be performed by the hospital staff the day of.  * Special note:  Every effort is made to have your procedure done on time.  Occasionally there are emergencies that present themselves at the hospital that may cause delays.  Please be patient if a delay does occur.                                                                                                           * If you have any questions after you get home, please call Lattie Haw, RN at [phone].    [place] - Preparing For Surgery  Before surgery, you can play an important role. Because skin is not sterile, your skin needs to be as free of germs as possible. You can reduce the number of germs on your skin by washing with CHG (chlorahexidine gluconate) Soap before  surgery.  CHG is an antiseptic cleaner which kills germs and bonds with the skin to continue killing germs even after washing.   Please do not use if you have an allergy to CHG or antibacterial soaps.  If your skin becomes reddened/irritated stop using the CHG.   Do not shave (including legs and underarms) for at least 48 hours prior to first CHG shower.  It is OK to shave your face.  Please follow these instructions carefully:  1.  Shower the night before surgery and the morning of surgery with CHG.  2.  If you choose to wash your hair, wash your hair first as usual with your normal shampoo.  3.  After you shampoo, rinse your hair and body thoroughly to remove the shampoo.  4.  Use CHG as you would any other liquid soap.  You can apply CHG directly to the skin and wash gently with a clean washcloth. 5.  Apply the CHG Soap to your body ONLY FROM THE NECK DOWN.  Do not use on open wounds or open sores.  Avoid contact  with your eyes, ears, mouth and genitals (private parts).    6.  Wash thoroughly, paying special attention to the area where your surgery will be performed.  7.  Thoroughly rinse your body with warm water from the neck down.   8.  DO NOT shower/wash with your normal soap after using and rinsing off the CHG soap.  9.  Pat yourself dry with a clean towel.   10.  Wear clean pajamas.   11.  Place clean sheets on your bed the night of your first shower and do not sleep with pets.  Day of Surgery: Do not apply any deodorants/lotions.  Please wear clean clothes to the hospital/surgery center.

## 2021-05-02 NOTE — H&P (View-Only) (Signed)
Patient ID: [name] [name], male   DOB: 1946-02-26, 75 y.o.   MRN: [id number]    Cardiology Office Note    Date:  05/02/2021   ID:  [name] [name], DOB 1946-05-17, MRN [id number]  PCP:  Patient, No Pcp Per (Inactive)  Cardiologist:   Sanda Klein, MD   Chief Complaint  Patient presents with   Pacemaker Problem    History of Present Illness:  [name] [name] is a 75 y.o. 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   He underwent aortic valve replacement in January 2012 (21 mm bioprosthesis) for symptomatic aortic stenosis. Early on, his prosthesis showed evidence of mild to moderate aortic insufficiency that has not progressed over time. He has preserved left ventricular systolic function.  He did have postoperative atrial fibrillation.  In November 2011 he received a dual-chamber permanent pacemaker for syncope that was likely related to second degree atrioventricular block. He has an MRI conditional Medtronic Revo device. 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  His pacemaker reached recommended replacement trigger on March 21, 2021 and switched to VVI mode at 65 bpm.  We have had a hard time getting him on the phone, but thankfully he has not been symptomatic with the mode switch.  He presents today with ventricular paced rhythm and retrograde atrial activation, unaware of any change.  Otherwise lead parameters are normal.  He has 18% atrial pacing and 13% ventricular pacing.  The heart rate histogram distribution is normal.  His device showed an episode of fibrillation with controlled ventricular response lasting for about 77 minutes, earlier this year.  In 2021 he had a 4-minute episode of atrial flutter.  In 2012 he had postoperative atrial fibrillation at the time of AVR.  He does not have a history of  stroke, TIA or embolic events.  He admits to being less physically active recently, which has led to some weight gain.  He denies any exertional dyspnea or angina and has not had palpitations, dizziness or syncope.  He does not have complaints of lower extremity edema, although a little bit of pitting edema is detected today on physical exam.  He denies orthopnea, PND, intermittent claudication or any focal neurological events.  Metabolic parameters are well controlled with a hemoglobin A1c of 6.1% and an LDL cholesterol of 86 earlier this year.  He is seeing [name] for endocrinology follow-up in [place].     Past Medical History:  Diagnosis Date   Aortic insufficiency    Atrial fibrillation (HCC)    AV block, 2nd degree    Diabetes ([place])    Dyslipidemia    Hypertension    LAFB (left anterior fascicular block)    Mitral insufficiency    RBBB    Ventricular tachycardia (HCC)    Ventricular tachycardia, non-sustained [facility])     Past Surgical History:  Procedure Laterality Date   AORTIC VALVE REPLACEMENT  10/06/2010   Edwards Magna Ease prosthesis   BACK SURGERY     CARDIAC CATHETERIZATION  06/30/2010   nonobstructive CAD, heavily ca+ AOV   NM MYOCAR PERF WALL MOTION  02/28/2007   normal   PERMANENT PACEMAKER INSERTION  07/21/2010   Medtronic    Outpatient Medications Prior to Visit  Medication Sig Dispense Refill   amLODipine (NORVASC) 5 MG tablet Take 1 tablet (5 mg total) by mouth daily. OFFICE VISIT  NEEDED 90 tablet 0   apixaban (ELIQUIS) 5 MG TABS tablet Take 1 tablet (5 mg total) by mouth 2 (two) times daily. 180 tablet 3   atorvastatin (LIPITOR) 40 MG tablet TAKE 1 TABLET DAILY 90 tablet 4   glipiZIDE (GLUCOTROL) 5 MG tablet TAKE ONE-HALF (1/2) TABLET EVERY MORNING AND 1 TABLET EVERY EVENING     irbesartan (AVAPRO) 300 MG tablet TAKE 1 TABLET (300 MG TOTAL) BY MOUTH DAILY.     metFORMIN (GLUCOPHAGE) 500 MG tablet TAKE 1/2 TABLET BY MOUTH EVERY MORNING AND 1 TABLET BY  MOUTH EVERY EVENING     metoprolol tartrate (LOPRESSOR) 50 MG tablet TAKE 1 TABLET TWICE A DAY 180 tablet 3   No facility-administered medications prior to visit.     Allergies:   Patient has no known allergies.   Social History   Socioeconomic History   Marital status: Single    Spouse name: Not on file   Number of children: Not on file   Years of education: Not on file   Highest education level: Not on file  Occupational History   Not on file  Tobacco Use   Smoking status: Never   Smokeless tobacco: Never  Substance and Sexual Activity   Alcohol use: No   Drug use: No   Sexual activity: Not on file  Other Topics Concern   Not on file  Social History Narrative   Not on file   Social Determinants of Health   Financial Resource Strain: Not on file  Food Insecurity: Not on file  Transportation Needs: Not on file  Physical Activity: Not on file  Stress: Not on file  Social Connections: Not on file     Family History:  The patient's brother recently underwent prostatectomy for prostate cancer  ROS:   Please see the history of present illness.    ROS All other systems are reviewed and are negative.   PHYSICAL EXAM:   VS:  BP 128/66 (BP Location: Left Arm, Patient Position: Sitting, Cuff Size: Large)   Pulse 65   Ht '5\' 11"'$  (1.803 m)   Wt 208 lb 12.8 oz (94.7 kg)   SpO2 98%   BMI 29.12 kg/m     General: Alert, oriented x3, no distress, overweight Head: no evidence of trauma, PERRL, EOMI, no exophtalmos or lid lag, no myxedema, no xanthelasma; normal ears, nose and oropharynx Neck: normal jugular venous pulsations and no hepatojugular reflux; brisk carotid pulses without delay and no carotid bruits Chest: clear to auscultation, no signs of consolidation by percussion or palpation, normal fremitus, symmetrical and full respiratory excursions Cardiovascular: normal position and quality of the apical impulse, regular rhythm, normal first and widely split second heart  sounds, 2/6 early peaking aortic ejection murmur and 1/6 decrescendo diastolic murmur at the right lower sternal border, no apical murmurs, rubs or gallops Abdomen: no tenderness or distention, no masses by palpation, no abnormal pulsatility or arterial bruits, normal bowel sounds, no hepatosplenomegaly Extremities: no clubbing, cyanosis; 1+ symmetrical pitting edema of the ankles; 2+ radial, ulnar and brachial pulses bilaterally; 2+ right femoral, posterior tibial and dorsalis pedis pulses; 2+ left femoral, posterior tibial and dorsalis pedis pulses; no subclavian or femoral bruits Neurological: grossly nonfocal Psych: Normal mood and affect   Wt Readings from Last 3 Encounters:  05/02/21 208 lb 12.8 oz (94.7 kg)  12/27/20 211 lb 6.4 oz (95.9 kg)  12/08/19 187 lb (84.8 kg)      Studies/Labs Reviewed:  Echocardiogram 12/25/2019  1.  Left ventricular ejection fraction, by estimation, is 60 to 65%. The  left ventricle has normal function. Left ventricular endocardial border  not optimally defined to evaluate regional wall motion. There is moderate  asymmetric left ventricular  hypertrophy of the basal-septal segment. Left ventricular diastolic  parameters are consistent with Grade I diastolic dysfunction (impaired  relaxation).   2. Right ventricular systolic function is mildly reduced. The right  ventricular size is mildly enlarged.   3. Left atrial size was moderately dilated.   4. The mitral valve is degenerative. Mild mitral valve regurgitation. No  evidence of mitral stenosis.   5. The aortic valve has been repaired/replaced. Aortic valve  regurgitation is mild. There is a 21 mm Magna pericardial valve present in  the aortic position. Procedure Date: 10/06/2010. Aortic valve mean  gradient measures 11.0 mmHg.   6. Aortic dilatation noted. There is mild dilatation of the ascending  aorta measuring 40 mm.   Comparison(s): A prior study was performed on 11/29/2016. No significant   change from prior study. Prior images reviewed side by side.   EKG:  EKG is ordered today.  It shows asynchronous ventricular pacing at 65 bpm.  The QRS is quite broad and the QTC is 530 ms.  Prior to his device reaching RRT his ECG showed sinus rhythm with trifascicular block (PR interval 250 ms, RBBB plus LAFB), no ischemic repolarization abnormalities, QTC 447 ms.  Recent Labs: May 2017 Glucose 126, TSH 3.79, creatinine 0.96, normal liver function tests, hemoglobin 17.3 Total cholesterol 134, triglycerides 120, LDL 84, HDL 34   Lipid Panel    Component Value Date/Time   CHOL 142 12/27/2020 1151   TRIG 108 12/27/2020 1151   HDL 36 (L) 12/27/2020 1151   CHOLHDL 3.9 12/27/2020 1151   CHOLHDL 4.3 11/28/2016 0919   VLDL 23 11/28/2016 0919   LDLCALC 86 12/27/2020 1151   03 29 2021 Cholesterol 137, HDL 39, LDL 83, triglycerides 77 Hemoglobin A1c 5.9% Creatinine 0.86, potassium 4.8, normal liver function tests  12/27/2020 Cholesterol 142, HDL 36, LDL 86, triglycerides 108 Hemoglobin A1c 6.1% Creatinine 0.95, potassium 4.6, normal liver function tests ASSESSMENT:    1. Paroxysmal atrial fibrillation (HCC)   2. Second degree AV block, Mobitz type I   3. Pacemaker battery depletion   4. Long term current use of anticoagulant   5. History of aortic valve replacement   6. Essential hypertension   7. Type 2 diabetes mellitus without complication, without long-term current use of insulin ([place])   8. Pre-op testing   9. Dyslipidemia (high LDL; low HDL)      PLAN:  In order of problems listed above:   PAF: Has had 3 episodes of atrial tachyarrhythmia, most recently an episode of atrial fibrillation controlled ventricular response lasting more than an hour in early 2022.  CHA2DS2-VASc 4 (age 23, hypertension, diabetes) he has no history of embolic events.  On anticoagulation. Second degree AV block: Has bifascicular block, but has traditionally required very little ventricular pacing  (recent increase in V pacing is related to device reaching RRT and mode switch to VVI). PPM'@ERI'$ : He is not pacemaker dependent.  We will schedule for generator change out.This procedure has been fully reviewed with the patient and written informed consent has been obtained.  Device was switched back to AAIR-DDDR until we can perform generator change out, to promote AV synchrony and minimize chances that he will develop symptoms. Anticoagulation: Tolerating Eliquis without any bleeding problems. S/P AVR with unexpected degree  of aortic insufficiency, but this has been stable over time.  Most recent echo performed a year ago showed no change in aortic insufficiency severity.  Reevaluation in another 2-3 years HTN: Good control.  Continue ARB for renal protection and beta-blocker for rate control in case of breakthrough arrhythmia.  Amlodipine seems to be causing a little bit of ankle swelling, but this is tolerable. DM: A1c remains acceptable, although it has deteriorated from a year ago.  Reminded him of the importance of weight loss, regular exercise, compliance with a healthy, carbohydrate restricted diet. HLP: Low HDL unlikely to improve without significant weight loss and increasing physical activity.  Continue statin.   Medication Adjustments/Labs and Tests Ordered: Current medicines are reviewed at length with the patient today.  Concerns regarding medicines are outlined above.  Medication changes, Labs and Tests ordered today are listed in the Patient Instructions below. Patient Instructions  Medication Instructions: Your physician recommends that you continue on your current medications as directed. Please refer to the Current Medication list given to you today.  * If you need a refill on your cardiac medications before your next appointment, please call your pharmacy. *  Labwork: Pre procedure lab work today: BMET & CBC  * Will notify you of abnormal results, otherwise continue current  treatment plan.*  Testing/Procedures: Your physician has recommended that you have a pacemaker/defibrillator generator change (battery change). Please follow the instructions below, located under the special instructions section.  Follow-Up: Your physician recommends that you schedule a wound check appointment 10-14 days, after your procedure on 05/23/21, with the device clinic.  Your physician recommends that you schedule a follow up appointment in 91 days, after your procedure on 05/23/21, with Dr. Sallyanne Kuster.  Thank you for choosing [facility]!!      Any Other Special Instructions Will Be Listed Below (If Applicable).    [facility] [place], [address]  [place], [place] [address]  Phone: [phone] Fax: [phone]    Generator Change Procedure Instructions  You are scheduled for a Generator Change (battery change) on  05/23/21  with Dr. Sallyanne Kuster.  1. Please arrive at the [facility], Entrance "A"  at [facility] at  1:30pm on the day of your procedure. (The address is [address]  2. DIET: You may have a light, early breakfast the morning of your procedure. NOTHING TO EAT AFTER 8:00 AM.  3. LABS: Completed 05/02/21  4. MEDICATIONS: Hold the Eliquis on Sunday 9/11 and the morning of the procedure on 05/23/21  5.  Plan for an overnight stay.  Bring your insurance cards and a list of you medications.  6.  Wash your chest and neck with surgical scrub the evening before and the morning of your procedure.  Rinse well. Please review the surgical scrub instruction sheet given to you.   7. Your chest will need to be shaved prior to this procedure (if needed). We ask that you do this yourself at home 1 to 2 days before or if uncomfortable/unable to do yourself, then it will be performed by the hospital staff the day of.  * Special note:  Every effort is made to have your procedure done on time.  Occasionally there  are emergencies that present themselves at the hospital that may cause delays.  Please be patient if a delay does occur.                                                                                                           *  If you have any questions after you get home, please call Lattie Haw, RN at [phone].    [facility] - Preparing For Surgery  Before surgery, you can play an important role. Because skin is not sterile, your skin needs to be as free of germs as possible. You can reduce the number of germs on your skin by washing with CHG (chlorahexidine gluconate) Soap before surgery.  CHG is an antiseptic cleaner which kills germs and bonds with the skin to continue killing germs even after washing.   Please do not use if you have an allergy to CHG or antibacterial soaps.  If your skin becomes reddened/irritated stop using the CHG.   Do not shave (including legs and underarms) for at least 48 hours prior to first CHG shower.  It is OK to shave your face.  Please follow these instructions carefully:  1.  Shower the night before surgery and the morning of surgery with CHG.  2.  If you choose to wash your hair, wash your hair first as usual with your normal shampoo.  3.  After you shampoo, rinse your hair and body thoroughly to remove the shampoo.  4.  Use CHG as you would any other liquid soap.  You can apply CHG directly to the skin and wash gently with a clean washcloth. 5.  Apply the CHG Soap to your body ONLY FROM THE NECK DOWN.  Do not use on open wounds or open sores.  Avoid contact with your eyes, ears, mouth and genitals (private parts).    6.  Wash thoroughly, paying special attention to the area where your surgery will be performed.  7.  Thoroughly rinse your body with warm water from the neck down.   8.  DO NOT shower/wash with your normal soap after using and rinsing off the CHG soap.  9.  Pat yourself dry with a clean towel.   10.  Wear clean pajamas.   11.  Place clean  sheets on your bed the night of your first shower and do not sleep with pets.  Day of Surgery: Do not apply any deodorants/lotions.  Please wear clean clothes to the hospital/surgery center.      Signed, Sanda Klein, MD  05/02/2021 [facility] [place], [place], [place]  [address] Phone: [phone]; Fax: [phone]

## 2021-05-03 LAB — CBC
Hematocrit: 47.7 % (ref 37.5–51.0)
Hemoglobin: 17.2 g/dL (ref 13.0–17.7)
MCH: 32.1 pg (ref 26.6–33.0)
MCHC: 36.1 g/dL — ABNORMAL HIGH (ref 31.5–35.7)
MCV: 89 fL (ref 79–97)
Platelets: 195 10*3/uL (ref 150–450)
RBC: 5.36 x10E6/uL (ref 4.14–5.80)
RDW: 12.7 % (ref 11.6–15.4)
WBC: 7.4 10*3/uL (ref 3.4–10.8)

## 2021-05-03 LAB — CUP PACEART REMOTE DEVICE CHECK
Battery Voltage: 2.79 V
Brady Statistic AP VP Percent: 2.58 %
Brady Statistic AP VS Percent: 40.56 %
Brady Statistic AS VP Percent: 1 %
Brady Statistic AS VS Percent: 55.86 %
Brady Statistic RA Percent Paced: 40.67 %
Brady Statistic RV Percent Paced: 3.65 %
Date Time Interrogation Session: 20220823101216
Implantable Lead Implant Date: 20111110
Implantable Lead Implant Date: 20111110
Implantable Lead Location: 753859
Implantable Lead Location: 753860
Implantable Pulse Generator Implant Date: 20111110
Lead Channel Impedance Value: 408 Ohm
Lead Channel Impedance Value: 576 Ohm
Lead Channel Sensing Intrinsic Amplitude: 1.682 mV
Lead Channel Sensing Intrinsic Amplitude: 2.388 mV
Lead Channel Setting Pacing Amplitude: 2 V
Lead Channel Setting Pacing Amplitude: 2.5 V
Lead Channel Setting Pacing Pulse Width: 0.4 ms
Lead Channel Setting Sensing Sensitivity: 0.6 mV

## 2021-05-03 LAB — BASIC METABOLIC PANEL
BUN/Creatinine Ratio: 15 (ref 10–24)
BUN: 14 mg/dL (ref 8–27)
CO2: 25 mmol/L (ref 20–29)
Calcium: 9.6 mg/dL (ref 8.6–10.2)
Chloride: 101 mmol/L (ref 96–106)
Creatinine, Ser: 0.93 mg/dL (ref 0.76–1.27)
Glucose: 66 mg/dL (ref 65–99)
Potassium: 4.9 mmol/L (ref 3.5–5.2)
Sodium: 141 mmol/L (ref 134–144)
eGFR: 86 mL/min/{1.73_m2} (ref >59)

## 2021-05-18 NOTE — Addendum Note (Signed)
Addended by: Cheri Kearns A on: 05/18/2021 08:04 PM   Modules accepted: Level of Service

## 2021-05-18 NOTE — Progress Notes (Signed)
Remote pacemaker transmission.   

## 2021-05-22 ENCOUNTER — Other Ambulatory Visit: Payer: Self-pay | Admitting: *Deleted

## 2021-05-22 DIAGNOSIS — I441 Atrioventricular block, second degree: Secondary | ICD-10-CM

## 2021-05-23 ENCOUNTER — Ambulatory Visit (HOSPITAL_COMMUNITY)
Admission: RE | Admit: 2021-05-23 | Discharge: 2021-05-23 | Disposition: A | Discharge status: Home / Self Care | Payer: Medicare Other | Attending: Cardiovascular Disease | Admitting: Cardiovascular Disease

## 2021-05-23 ENCOUNTER — Other Ambulatory Visit: Payer: Self-pay

## 2021-05-23 ENCOUNTER — Ambulatory Visit (HOSPITAL_COMMUNITY)
Admission: RE | Disposition: A | Discharge status: Home / Self Care | Payer: Self-pay | Source: Home / Self Care | Attending: Cardiovascular Disease

## 2021-05-23 DIAGNOSIS — E785 Hyperlipidemia, unspecified: Secondary | ICD-10-CM | POA: Diagnosis not present

## 2021-05-23 DIAGNOSIS — I441 Atrioventricular block, second degree: Secondary | ICD-10-CM

## 2021-05-23 DIAGNOSIS — Z7901 Long term (current) use of anticoagulants: Secondary | ICD-10-CM | POA: Insufficient documentation

## 2021-05-23 DIAGNOSIS — I480 Paroxysmal atrial fibrillation: Secondary | ICD-10-CM | POA: Insufficient documentation

## 2021-05-23 DIAGNOSIS — Z45010 Encounter for checking and testing of cardiac pacemaker pulse generator [battery]: Secondary | ICD-10-CM

## 2021-05-23 DIAGNOSIS — E119 Type 2 diabetes mellitus without complications: Secondary | ICD-10-CM | POA: Insufficient documentation

## 2021-05-23 DIAGNOSIS — Z7984 Long term (current) use of oral hypoglycemic drugs: Secondary | ICD-10-CM | POA: Insufficient documentation

## 2021-05-23 DIAGNOSIS — I10 Essential (primary) hypertension: Secondary | ICD-10-CM | POA: Insufficient documentation

## 2021-05-23 DIAGNOSIS — Z952 Presence of prosthetic heart valve: Secondary | ICD-10-CM | POA: Diagnosis not present

## 2021-05-23 DIAGNOSIS — Z79899 Other long term (current) drug therapy: Secondary | ICD-10-CM | POA: Diagnosis not present

## 2021-05-23 DIAGNOSIS — I352 Nonrheumatic aortic (valve) stenosis with insufficiency: Secondary | ICD-10-CM | POA: Diagnosis not present

## 2021-05-23 HISTORY — PX: PPM GENERATOR CHANGEOUT: EP1233

## 2021-05-23 LAB — GLUCOSE, CAPILLARY: Glucose-Capillary: 77 mg/dL (ref 70–99)

## 2021-05-23 SURGERY — PPM GENERATOR CHANGEOUT

## 2021-05-23 MED ORDER — SODIUM CHLORIDE 0.9% FLUSH: 3.0000 mL | INTRAVENOUS | Status: DC | PRN

## 2021-05-23 MED ORDER — SODIUM CHLORIDE 0.9 % IV SOLN
INTRAVENOUS | Status: AC
  Filled 2021-05-23: qty 2

## 2021-05-23 MED ORDER — LIDOCAINE HCL (PF) 1 % IJ SOLN
INTRAMUSCULAR | Status: DC | PRN
  Administered 2021-05-23: 30 mL

## 2021-05-23 MED ORDER — SODIUM CHLORIDE 0.9 % IV SOLN: INTRAVENOUS | Status: DC

## 2021-05-23 MED ORDER — MIDAZOLAM HCL 5 MG/5ML IJ SOLN
INTRAMUSCULAR | Status: AC
  Filled 2021-05-23: qty 5

## 2021-05-23 MED ORDER — SODIUM CHLORIDE 0.9% FLUSH: 3.0000 mL | Freq: Two times a day (BID) | INTRAVENOUS | Status: DC

## 2021-05-23 MED ORDER — FENTANYL CITRATE (PF) 100 MCG/2ML IJ SOLN
INTRAMUSCULAR | Status: DC | PRN
  Administered 2021-05-23: 25 ug via INTRAVENOUS

## 2021-05-23 MED ORDER — ACETAMINOPHEN 325 MG PO TABS
325.0000 mg | ORAL_TABLET | ORAL | Status: DC | PRN
  Filled 2021-05-23: qty 2

## 2021-05-23 MED ORDER — FENTANYL CITRATE (PF) 100 MCG/2ML IJ SOLN
INTRAMUSCULAR | Status: AC
  Filled 2021-05-23: qty 2

## 2021-05-23 MED ORDER — SODIUM CHLORIDE 0.9 % IV SOLN: 250.0000 mL | INTRAVENOUS | Status: DC | PRN

## 2021-05-23 MED ORDER — CHLORHEXIDINE GLUCONATE 4 % EX LIQD
4.0000 "application " | Freq: Once | CUTANEOUS | Status: DC
  Filled 2021-05-23: qty 60

## 2021-05-23 MED ORDER — ONDANSETRON HCL 4 MG/2ML IJ SOLN: 4.0000 mg | Freq: Four times a day (QID) | INTRAMUSCULAR | Status: DC | PRN

## 2021-05-23 MED ORDER — CEFAZOLIN SODIUM-DEXTROSE 2-4 GM/100ML-% IV SOLN
INTRAVENOUS | Status: AC
  Filled 2021-05-23: qty 100

## 2021-05-23 MED ORDER — MIDAZOLAM HCL 5 MG/5ML IJ SOLN
INTRAMUSCULAR | Status: DC | PRN
  Administered 2021-05-23: 1 mg via INTRAVENOUS

## 2021-05-23 MED ORDER — SODIUM CHLORIDE 0.9 % IV SOLN
80.0000 mg | INTRAVENOUS | Status: DC
  Filled 2021-05-23: qty 2

## 2021-05-23 MED ORDER — CEFAZOLIN SODIUM-DEXTROSE 2-4 GM/100ML-% IV SOLN
2.0000 g | INTRAVENOUS | Status: AC
  Administered 2021-05-23: 2 g via INTRAVENOUS
  Filled 2021-05-23: qty 100

## 2021-05-23 MED ORDER — APIXABAN 5 MG PO TABS: 5.0000 mg | ORAL_TABLET | Freq: Two times a day (BID) | ORAL | 3 refills | Status: DC

## 2021-05-23 SURGICAL SUPPLY — 5 items
CABLE SURGICAL S-101-97-12 (CABLE) ×2 IMPLANT
IPG PACE AZUR XT DR MRI W1DR01 (Pacemaker) IMPLANT
PACE AZURE XT DR MRI W1DR01 (Pacemaker) ×2 IMPLANT
PAD PRO RADIOLUCENT 2001M-C (PAD) ×2 IMPLANT
TRAY PACEMAKER INSERTION (PACKS) ×2 IMPLANT

## 2021-05-23 NOTE — Interval H&P Note (Signed)
History and Physical Interval Note:  05/23/2021 2:53 PM  [name] [name]  has presented today for surgery, with the diagnosis of eri.  The various methods of treatment have been discussed with the patient and family. After consideration of risks, benefits and other options for treatment, the patient has consented to  Procedure(s): PPM GENERATOR CHANGEOUT (N/A) as a surgical intervention.  The patient's history has been reviewed, patient examined, no change in status, stable for surgery.  I have reviewed the patient's chart and labs.  Questions were answered to the patient's satisfaction.     Amontae Ng

## 2021-05-23 NOTE — Discharge Instructions (Signed)
Supplemental Discharge Instructions for  Pacemaker/Defibrillator Patients  Activity No restrictions. NO DRIVING is preferable for 2 weeks; If absolutely necessary, drive only short, familiar routes. DO wear your seatbelt, even if it crosses over the pacemaker site.  WOUND CARE Keep the wound area clean and dry.  Remove the dressing the day after you return home (usually 48 hours after the procedure). DO NOT SUBMERGE UNDER WATER UNTIL FULLY HEALED (no tub baths, hot tubs, swimming pools, etc.).  You  may shower or take a sponge bath after the dressing is removed. DO NOT SOAK the area and do not allow the shower to directly spray on the site. If you have staples, these will be removed in the office in 7-14 days. If you have tape/steri-strips on your wound, these will fall off; do not pull them off prematurely.   No bandage is needed on the site.  DO  NOT apply any creams, oils, or ointments to the wound area. If you notice any drainage or discharge from the wound, any swelling, excessive redness or bruising at the site, or if you develop a fever > 101? F after you are discharged home, call the office at once.  Special Instructions You are still able to use cellular telephones.  Avoid carrying your cellular phone near your device. When traveling through airports, show security personnel your identification card to avoid being screened in the metal detectors.  Avoid arc welding equipment, MRI testing (magnetic resonance imaging), TENS units (transcutaneous nerve stimulators).  Call the office for questions about other devices. Avoid electrical appliances that are in poor condition or are not properly grounded. Microwave ovens are safe to be near or to operate.

## 2021-05-23 NOTE — Op Note (Signed)
Procedure report  Procedure performed:  1. Dual chamber pacemaker generator changeout  2. Light sedation  Reason for procedure:  1. Device generator at elective replacement interval  2. Second degree AV block Procedure performed by:  Sanda Klein, MD  Complications:  None  Estimated blood loss:  <5 mL  Medications administered during procedure:  Ancef 2 g intravenously, lidocaine 1% 30 mL locally, fentanyl 25 mcg intravenously, Versed 1 mg intravenously Device details:   New Generator Medtronic Azure XT DR model number W1DR 01, serial number [id number] Right atrial lead (chronic) Medtronic, model number [id number], serial numberLFP [id number] (implanted 07/21/2010) Right ventricular lead (chronic) Medtronic, model number [id number], serial number [id number] (implanted 07/21/2010)  Explanted generator Medtronic Revo,  model number RV DR 01,  (implanted 07/21/2010)  Procedure details:  After the risks and benefits of the procedure were discussed the patient provided informed consent. She was brought to the cardiac catheter lab in the fasting state. The patient was prepped and draped in usual sterile fashion. Local anesthesia with 1% lidocaine was administered to to the left infraclavicular area. A 5-6cm horizontal incision was made parallel with and 2-3 cm caudal to the left clavicle, in the area of an old scar.  Using minimal electrocautery and mostly sharp and blunt dissection the prepectoral pocket was opened carefully to avoid injury to the loops of chronic leads. Extensive dissection was not necessary. The device was explanted. The pocket was carefully inspected for hemostasis and flushed with copious amounts of antibiotic solution.  The leads were disconnected from the old generator and the new generator was connected to the chronic leads, with appropriate pacing noted.  Device testing via telemetry showed normal parameters of both leads.  The entire system was then carefully inserted in the  pocket with care been taking that the leads and device assumed a comfortable position without pressure on the incision. Great care was taken that the leads be located deep to the generator. The pocket was then closed in layers using 2 layers of 2-0 Vicryl, 1 layer of 3-0 Vicryl and cutaneous strips after which a sterile dressing was applied.   At the end of the procedure the following lead parameters were encountered:   Right atrial lead sensed P waves 2.0 mV, impedance 418 ohms, threshold 0.5 at 0.4 ms pulse width.  Right ventricular lead sensed R waves 4 mV, impedance 605 ohms, threshold 1.5 at 0.4 ms pulse width.  Sanda Klein, MD, [facility] [phone] office [phone] pager

## 2021-05-24 ENCOUNTER — Encounter (HOSPITAL_COMMUNITY): Payer: Self-pay | Admitting: Cardiovascular Disease

## 2021-05-24 MED FILL — Gentamicin Sulfate Inj 40 MG/ML: INTRAMUSCULAR | Qty: 80 | Status: AC

## 2021-05-30 ENCOUNTER — Encounter: Payer: Self-pay | Admitting: Cardiovascular Disease

## 2021-06-02 ENCOUNTER — Telehealth: Payer: Self-pay

## 2021-06-02 ENCOUNTER — Ambulatory Visit (INDEPENDENT_AMBULATORY_CARE_PROVIDER_SITE_OTHER): Payer: Medicare Other

## 2021-06-02 ENCOUNTER — Other Ambulatory Visit: Payer: Self-pay

## 2021-06-02 DIAGNOSIS — I441 Atrioventricular block, second degree: Secondary | ICD-10-CM | POA: Diagnosis not present

## 2021-06-02 NOTE — Telephone Encounter (Signed)
Pt calling to check if his transmission has been received.  He underwent gen changeout on 05/23/21 with Dr. Vivia Ewing.  Paceart and Carelink have not been updated.    Updated device info in Paceart and Carelink.   Rescheduled future checks.  Noted that patient has appt today for device clinic.  Advised patient to bring Medtronic phone with him to clinic today for education on how to use it/.

## 2021-06-02 NOTE — Patient Instructions (Addendum)

## 2021-06-03 LAB — CUP PACEART INCLINIC DEVICE CHECK
Battery Remaining Longevity: 170 mo
Battery Voltage: 3.22 V
Brady Statistic AP VP Percent: 0.08 %
Brady Statistic AP VS Percent: 24.14 %
Brady Statistic AS VP Percent: 0.08 %
Brady Statistic AS VS Percent: 75.7 %
Brady Statistic RA Percent Paced: 25.67 %
Brady Statistic RV Percent Paced: 0.16 %
Date Time Interrogation Session: 20220922122800
Implantable Lead Implant Date: 20111110
Implantable Lead Implant Date: 20111110
Implantable Lead Location: 753859
Implantable Lead Location: 753860
Implantable Pulse Generator Implant Date: 20220912
Lead Channel Impedance Value: 342 Ohm
Lead Channel Impedance Value: 418 Ohm
Lead Channel Impedance Value: 532 Ohm
Lead Channel Impedance Value: 608 Ohm
Lead Channel Pacing Threshold Amplitude: 0.75 V
Lead Channel Pacing Threshold Amplitude: 1.5 V
Lead Channel Pacing Threshold Pulse Width: 0.4 ms
Lead Channel Pacing Threshold Pulse Width: 0.4 ms
Lead Channel Sensing Intrinsic Amplitude: 1.8 mV
Lead Channel Sensing Intrinsic Amplitude: 3.625 mV
Lead Channel Setting Pacing Amplitude: 2 V
Lead Channel Setting Pacing Amplitude: 3 V
Lead Channel Setting Pacing Pulse Width: 0.4 ms
Lead Channel Setting Sensing Sensitivity: 1.2 mV

## 2021-06-03 NOTE — Progress Notes (Signed)
Wound check appointment. Steri-strips removed. Wound without redness or edema. Incision edges approximated, wound well healed. Normal device function. Thresholds, sensing, and impedances consistent with implant measurements. Device programmed at 2.0V/3.0V today for chronic lead settings and appropriate safety margins. Histogram distribution appropriate for patient and level of activity. No mode switches or high ventricular rates noted. Patient educated about wound care, arm mobility, lifting restrictions. Patient enrolled in remote monitoring with next transmission scheduled 07/04/21. ROV follow up with Dr. Sallyanne Kuster 08/29/21.

## 2021-06-20 ENCOUNTER — Other Ambulatory Visit: Payer: Self-pay | Admitting: Cardiovascular Disease

## 2021-07-12 DIAGNOSIS — C61 Malignant neoplasm of prostate: Secondary | ICD-10-CM

## 2021-07-12 HISTORY — DX: Malignant neoplasm of prostate: C61

## 2021-08-15 ENCOUNTER — Other Ambulatory Visit: Payer: Self-pay | Admitting: Urology

## 2021-08-15 ENCOUNTER — Other Ambulatory Visit (HOSPITAL_COMMUNITY): Payer: Self-pay | Admitting: Urology

## 2021-08-15 DIAGNOSIS — C61 Malignant neoplasm of prostate: Secondary | ICD-10-CM

## 2021-08-22 ENCOUNTER — Ambulatory Visit (INDEPENDENT_AMBULATORY_CARE_PROVIDER_SITE_OTHER): Payer: Medicare Other

## 2021-08-22 DIAGNOSIS — I441 Atrioventricular block, second degree: Secondary | ICD-10-CM | POA: Diagnosis not present

## 2021-08-22 DIAGNOSIS — Z950 Presence of cardiac pacemaker: Secondary | ICD-10-CM

## 2021-08-23 ENCOUNTER — Telehealth: Payer: Self-pay | Admitting: Cardiovascular Disease

## 2021-08-23 NOTE — Telephone Encounter (Signed)
LMOVM for patient to call the device clinic. I left our direct number for the patient to call back.

## 2021-08-23 NOTE — Telephone Encounter (Signed)
°  1. Has your device fired? No   2. Is you device beeping? No   3. Are you experiencing draining or swelling at device site? No   4. Are you calling to see if we received your device transmission? Yes, received a call this morning that his transmission wasn't received.   5. Have you passed out? No   Apps states it is active and can send transmission at anytime.     Please route to [place]

## 2021-08-24 LAB — CUP PACEART REMOTE DEVICE CHECK
Battery Remaining Longevity: 171 mo
Battery Voltage: 3.21 V
Brady Statistic AP VP Percent: 0.09 %
Brady Statistic AP VS Percent: 23.38 %
Brady Statistic AS VP Percent: 0.08 %
Brady Statistic AS VS Percent: 76.46 %
Brady Statistic RA Percent Paced: 23.58 %
Brady Statistic RV Percent Paced: 0.17 %
Date Time Interrogation Session: 20221214054020
Implantable Lead Implant Date: 20111110
Implantable Lead Implant Date: 20111110
Implantable Lead Location: 753859
Implantable Lead Location: 753860
Implantable Pulse Generator Implant Date: 20220912
Lead Channel Impedance Value: 342 Ohm
Lead Channel Impedance Value: 418 Ohm
Lead Channel Impedance Value: 513 Ohm
Lead Channel Impedance Value: 589 Ohm
Lead Channel Pacing Threshold Amplitude: 0.75 V
Lead Channel Pacing Threshold Amplitude: 1.75 V
Lead Channel Pacing Threshold Pulse Width: 0.4 ms
Lead Channel Pacing Threshold Pulse Width: 0.4 ms
Lead Channel Sensing Intrinsic Amplitude: 1.75 mV
Lead Channel Sensing Intrinsic Amplitude: 1.75 mV
Lead Channel Sensing Intrinsic Amplitude: 4 mV
Lead Channel Sensing Intrinsic Amplitude: 4 mV
Lead Channel Setting Pacing Amplitude: 1.75 V
Lead Channel Setting Pacing Amplitude: 3.75 V
Lead Channel Setting Pacing Pulse Width: 0.4 ms
Lead Channel Setting Sensing Sensitivity: 1.2 mV

## 2021-08-24 NOTE — Telephone Encounter (Signed)
Transmission received 08/24/2021

## 2021-08-26 ENCOUNTER — Encounter (HOSPITAL_COMMUNITY)
Admission: RE | Admit: 2021-08-26 | Discharge: 2021-08-26 | Disposition: A | Discharge status: Home / Self Care | Payer: Medicare Other | Source: Ambulatory Visit | Attending: Urology | Admitting: Urology

## 2021-08-26 DIAGNOSIS — C61 Malignant neoplasm of prostate: Secondary | ICD-10-CM | POA: Diagnosis present

## 2021-08-26 MED ORDER — TECHNETIUM TC 99M MEDRONATE IV KIT
21.9000 | PACK | Freq: Once | INTRAVENOUS | Status: AC
  Administered 2021-08-26: 21.9 via INTRAVENOUS

## 2021-08-29 ENCOUNTER — Encounter: Payer: Medicare Other | Admitting: Cardiovascular Disease

## 2021-09-01 NOTE — Progress Notes (Signed)
Remote pacemaker transmission.   

## 2021-09-19 ENCOUNTER — Other Ambulatory Visit: Payer: Self-pay | Admitting: Urology

## 2021-10-11 ENCOUNTER — Telehealth: Payer: Self-pay | Admitting: Radiation Oncology

## 2021-10-11 NOTE — Telephone Encounter (Signed)
Called patient to schedule a consultation w. Dr. Manning. No answer, LVM for a return call.  ?

## 2021-10-12 ENCOUNTER — Telehealth: Payer: Self-pay | Admitting: Radiation Oncology

## 2021-10-12 NOTE — Telephone Encounter (Signed)
Called patient to schedule a consultation w. Dr. Manning. No answer, LVM for a return call.  ?

## 2021-10-13 ENCOUNTER — Telehealth: Payer: Self-pay | Admitting: Radiation Oncology

## 2021-10-13 NOTE — Telephone Encounter (Signed)
Called patient to schedule a consultation w. Dr. Manning. No answer, LVM for a return call.  ?

## 2021-10-13 NOTE — Telephone Encounter (Signed)
Sent a "unable to contact letter" to the patient's listed address 10/13/21.

## 2021-10-13 NOTE — Telephone Encounter (Signed)
Called the patient's emergency contact to schedule a consultation w. Dr. Tammi Klippel. No answer, LVM for a return call.

## 2021-10-24 ENCOUNTER — Encounter: Payer: Self-pay | Admitting: Cardiovascular Disease

## 2021-10-25 ENCOUNTER — Encounter: Payer: Self-pay | Admitting: Cardiovascular Disease

## 2021-10-25 ENCOUNTER — Ambulatory Visit
Admission: RE | Admit: 2021-10-25 | Discharge: 2021-10-25 | Disposition: A | Discharge status: Home / Self Care | Payer: Medicare Other | Source: Ambulatory Visit | Attending: Radiation Oncology | Admitting: Radiation Oncology

## 2021-10-25 ENCOUNTER — Other Ambulatory Visit: Payer: Self-pay

## 2021-10-25 DIAGNOSIS — E119 Type 2 diabetes mellitus without complications: Secondary | ICD-10-CM | POA: Diagnosis not present

## 2021-10-25 DIAGNOSIS — Z7901 Long term (current) use of anticoagulants: Secondary | ICD-10-CM | POA: Insufficient documentation

## 2021-10-25 DIAGNOSIS — I472 Ventricular tachycardia, unspecified: Secondary | ICD-10-CM | POA: Insufficient documentation

## 2021-10-25 DIAGNOSIS — I4891 Unspecified atrial fibrillation: Secondary | ICD-10-CM | POA: Insufficient documentation

## 2021-10-25 DIAGNOSIS — Z7984 Long term (current) use of oral hypoglycemic drugs: Secondary | ICD-10-CM | POA: Diagnosis not present

## 2021-10-25 DIAGNOSIS — Z79899 Other long term (current) drug therapy: Secondary | ICD-10-CM | POA: Diagnosis not present

## 2021-10-25 DIAGNOSIS — I10 Essential (primary) hypertension: Secondary | ICD-10-CM | POA: Insufficient documentation

## 2021-10-25 DIAGNOSIS — E785 Hyperlipidemia, unspecified: Secondary | ICD-10-CM | POA: Insufficient documentation

## 2021-10-25 DIAGNOSIS — C61 Malignant neoplasm of prostate: Secondary | ICD-10-CM | POA: Insufficient documentation

## 2021-10-25 NOTE — Progress Notes (Signed)
GU Location of Tumor / Histology: Prostate Ca  If Prostate Cancer, Gleason Score is (4 + 3) and PSA is (11.9 as of 04/2021)  Biopsies  Dr. Tresa Moore      Past/Anticipated interventions by urology, if any:   Dr. Tresa Moore   Past/Anticipated interventions by medical oncology, if any: NA  Weight changes, if any: No stable at this time.  Not unintended loss.  IPSS:  6 SHIM:  12  Bowel/Bladder complaints, if any:  No bowel and No bladder.  Nausea/Vomiting, if any: No  Pain issues, if any:  0/10  SAFETY ISSUES: Prior radiation?  No Pacemaker/ICD? Yes, Pacemaker Possible current pregnancy?  Male Is the patient on methotrexate? No  Current Complaints / other details:  Need more information on treatment options.

## 2021-10-25 NOTE — Progress Notes (Signed)
Radiation Oncology         [phone] ________________________________  Initial Outpatient Consultation  Name: [name] [name] MRN: [id number]  Date: 10/25/2021  DOB: [birth year]  GL:OVFIEPP, No Pcp Per (Inactive)  Alexis Frock, MD   REFERRING PHYSICIAN: Alexis Frock, MD  DIAGNOSIS: 76 y.o. gentleman with Stage T2a adenocarcinoma of the prostate with Gleason score of 4+3, and PSA of 11.9.    ICD-10-CM   1. Malignant neoplasm of prostate [facility])  C61       HISTORY OF PRESENT ILLNESS: [name] [name] is a 76 y.o. male with a diagnosis of prostate cancer. He was noted to have an elevated PSA of 12.8 in 12/2020, by his primary care physician, Dr. Quincy Simmonds.  Accordingly, he was referred for evaluation in urology by Dr. Tresa Moore in 02/2021,  digital rectal examination was performed at that time revealing slight right apical induration. A repeat PSA performed on 05/10/21 remained elevated at 11.9. Therefore, the patient proceeded to transrectal ultrasound with 12 biopsies of the prostate on 08/01/21.  The prostate volume measured 56 cc.  Out of 12 core biopsies, 8 were positive.  The maximum Gleason score was 4+3, and this was seen in the right apex lateral, right mid lateral (two small foci, with perineural invasion), and right base. Additionally, Gleason 3+4 was seen in the right apex and left base lateral, and Gleason 3+3 in the left apex lateral, left apex, and right mid (small focus).  He underwent staging CT A/P on 08/26/21 showing no evidence of metastatic disease. 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  The patient reviewed the biopsy results with his urologist and he has kindly been referred today for discussion of potential radiation treatment  options.  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  SOCIAL HISTORY:  Social History   Socioeconomic History   Marital status: Single    Spouse name: Not on file   Number of children: Not on file   Years of education: Not on file   Highest education level: Not on file  Occupational History   Not on file  Tobacco Use   Smoking status: Never   Smokeless tobacco: Never  Substance and Sexual Activity   Alcohol use: No   Drug use: No  Sexual activity: Not on file  Other Topics Concern   Not on file  Social History Narrative   Not on file   Social Determinants of Health   Financial Resource Strain: Not on file  Food Insecurity: Not on file  Transportation Needs: Not on file  Physical Activity: Not on file  Stress: Not on file  Social Connections: Not on file  Intimate Partner Violence: Not on file    ALLERGIES: Patient has no known allergies.  MEDICATIONS:  Current Outpatient Medications  Medication Sig Dispense Refill   amLODipine (NORVASC) 5 MG tablet  Take 1 tablet (5 mg total) by mouth daily. OFFICE VISIT NEEDED 90 tablet 0   apixaban (ELIQUIS) 5 MG TABS tablet Take 1 tablet (5 mg total) by mouth 2 (two) times daily. 180 tablet 3   atorvastatin (LIPITOR) 40 MG tablet TAKE 1 TABLET DAILY 90 tablet 4   glipiZIDE (GLUCOTROL) 5 MG tablet Take 2.5-5 mg by mouth See admin instructions. 2.5 mg in the morning, 5 mg in the evening     irbesartan (AVAPRO) 300 MG tablet TAKE 1 TABLET (300 MG TOTAL) BY MOUTH DAILY.     metFORMIN (GLUCOPHAGE) 500 MG tablet Take 250-500 mg by mouth See admin instructions. 250 mg in the morning, 500 mg in the evening     metoprolol tartrate (LOPRESSOR) 50 MG tablet TAKE 1 TABLET TWICE A DAY 180 tablet 3   No current facility-administered medications for this encounter.    REVIEW OF SYSTEMS:  On review of systems, the patient reports that he is doing well overall. He denies any chest pain, shortness of breath, cough, fevers, chills, night sweats, unintended weight changes. He denies any bowel disturbances, and denies abdominal pain, nausea or vomiting. He denies any new musculoskeletal or joint aches or pains. His IPSS was 6, indicating mild urinary symptoms. His SHIM was 12, indicating he has moderate erectile dysfunction. A complete review of systems is obtained and is otherwise negative.    PHYSICAL EXAM:  Wt Readings from Last 3 Encounters:  10/25/21 203 lb 3.2 oz (92.2 kg)  05/23/21 214 lb (97.1 kg)  05/02/21 208 lb 12.8 oz (94.7 kg)   Temp Readings from Last 3 Encounters:  10/25/21 97.8 F (36.6 C)  05/23/21 98.2 F (36.8 C) (Oral)   BP Readings from Last 3 Encounters:  10/25/21 (!) 190/79  05/23/21 (!) 151/73  05/02/21 128/66   Pulse Readings from Last 3 Encounters:  10/25/21 86  05/23/21 78  05/02/21 65    /10  In general this is a well appearing Caucasian male in no acute distress. He's alert and oriented x4 and appropriate throughout the examination. Cardiopulmonary assessment is negative for  acute distress, and he exhibits normal effort.     KPS = 90  100 - Normal; no complaints; no evidence of disease. 90   - Able to carry on normal activity; minor signs or symptoms of disease. 80   - Normal activity with effort; some signs or symptoms of disease. 55   - Cares for self; unable to carry on normal activity or to do active work. 60   - Requires occasional assistance, but is able to care for most of his personal needs. 50   - Requires considerable assistance and frequent medical care. 60   - Disabled; requires special care and assistance. 79   - Severely disabled; hospital admission is indicated although death not imminent. 64   - Very sick; hospital admission necessary; active supportive treatment  necessary. 10   - Moribund; fatal processes progressing rapidly. 0     - Dead  Karnofsky DA, Abelmann [place], Craver LS and Burchenal [facility] [phone]) The use of the nitrogen mustards in the palliative treatment of carcinoma: with particular reference to bronchogenic carcinoma Cancer 1 634-56  LABORATORY DATA:  Lab Results  Component Value Date   WBC 7.4 05/02/2021   HGB 17.2 05/02/2021   HCT 47.7 05/02/2021   MCV 89 05/02/2021   PLT 195 05/02/2021   Lab Results  Component Value Date   NA 141 05/02/2021   K 4.9 05/02/2021   CL 101 05/02/2021   CO2 25 05/02/2021   Lab Results  Component Value Date   ALT 22 12/27/2020   AST 23 12/27/2020   ALKPHOS 76 12/27/2020   BILITOT 0.9 12/27/2020     RADIOGRAPHY: No results found.    IMPRESSION/PLAN: 1. 76 y.o. gentleman with Stage T2a adenocarcinoma of the prostate with Gleason Score of 4+3, and PSA of 11.9. We discussed the patient's workup and outlined the nature of prostate cancer in this setting. The patient's T stage, Gleason's score, and PSA put him into the unfavorable intermediate risk group. Accordingly, he is eligible for a variety of potential treatment options including prostatectomy or ST-ADT in combination with 5.5 weeks of  external radiation. We discussed the available radiation techniques, and focused on the details and logistics of delivery. He is not an ideal surgical candidate due to his significant cardiac disease and need for chronic anticoagulation. Therefore, we discussed and outlined the risks, benefits, short and long-term effects associated with daily external beam radiotherapy and compared and contrasted these with prostatectomy. We discussed the role of SpaceOAR gel in reducing the rectal toxicity associated with radiotherapy. We also detailed the role of ADT in the treatment of unfavorable intermediate risk prostate cancer and outlined the associated side effects that could be expected with this therapy. He was encouraged to ask questions that were answered to his stated satisfaction.  At the conclusion of our conversation, the patient is interested in moving forward with 5.5 weeks of external beam therapy concurrent with ST-ADT. We will share our discussion with Dr. Tresa Moore and make arrangements for a follow up visit, first available, to start ADT now. We will also coordinate for fiducial markers and SpaceOAR gel placement in April 2023, prior to simulation, to reduce rectal toxicity from radiotherapy. The patient appears to have a good understanding of his disease and our treatment recommendations which are of curative intent and is in agreement with the stated plan.  Therefore, we will move forward with treatment planning accordingly, in anticipation of beginning IMRT approximately 2 months after starting ADT. We enjoyed meeting him and look forward to continuing to participate in his care.  We personally spent 70 minutes in this encounter including chart review, reviewing radiological studies, meeting face-to-face with the patient, entering orders and completing documentation.    Nicholos Johns, PA-C    Tyler Pita, MD  [facility] Direct Dial: [phone]   Fax:  [phone] [email or web address]   Skype   LinkedIn   This document serves as a record of services personally performed by Tyler Pita, MD and Freeman Caldron, PA-C. It was created on their behalf by Wilburn Mylar, a trained medical scribe. The creation of this record is based on the scribe's personal observations and the provider's statements to them. This document has been checked and approved by the attending provider.

## 2021-10-25 NOTE — Progress Notes (Signed)
TO BE COMPLETED BY RADIATION ONCOLOGIST OFFICE:   Patient Name: [name] [name]   Date of Birth: 02-13-1946   Radiation Oncologist:   Site to be Treated:   Will x-rays >10 MV be used? No  Will the radiation be >10 cm from the device? Yes  Planned Treatment Start Date:   TO BE COMPLETED BY CARDIOLOGIST OFFICE:   Device Information:  Pacemaker [x]      ICD []    Brand: Medtronic: [id number] Model #: Santina Evans DR MRI  Serial Number: [id number]     Date of Placement: 05/23/2021  Site of Placement: Left Chest  Remote Device Check--Frequency: Every 91 days   Last Check: 08/24/2021  Is the Patient Pacer Dependent?:  Yes []   No [x]   Does cardiologist request Radiation Oncology to schedule device testing by vendor for the following:  Prior to the Initiation of Treatments?  Yes []  No [x]  During Treatments?  Yes []  No [x]  Post Radiation Treatments?  Yes []  No [x]   Is device monitoring necessary by vendor/cardiologist team during treatments?  Yes []   No [x]   Is cardiac monitoring by Radiation Oncology nursing necessary during treatments? Yes []   No [x]   Do you recommend device be relocated prior to Radiation Treatment? Yes []   No [x]   **PLEASE LIST ANY NOTES OR SPECIAL REQUESTS:       CARDIOLOGIST SIGNATURE:  Dr. Sanda Klein Per [facility] Standing Orders, Simone Curia  10/25/2021 1:20 PM  **Please route completed form back to Radiation Oncology Nursing and "[place]", OR send an update if there will be a delay in having form completed by expected start date.  **Call [phone] if you have any questions or do not get an in-basket response from a Radiation Oncology staff member

## 2021-10-25 NOTE — Progress Notes (Signed)
Introduced myself to the patient, and his brother, as the prostate nurse navigator.  No barriers to care identified at this time.  He is here to discuss his radiation treatment options.  I gave him my business card and asked him to call me with questions or concerns.  Verbalized understanding.  

## 2021-11-02 ENCOUNTER — Telehealth: Payer: Self-pay | Admitting: Cardiovascular Disease

## 2021-11-02 MED ORDER — AMLODIPINE BESYLATE 5 MG PO TABS: 5.0000 mg | ORAL_TABLET | Freq: Every day | ORAL | 0 refills | Status: DC

## 2021-11-02 NOTE — Telephone Encounter (Signed)
°*  STAT* If patient is at the pharmacy, call can be transferred to refill team.   1. Which medications need to be refilled? (please list name of each medication and dose if known) amLODipine (NORVASC) 5 MG tablet  2. Which pharmacy/location (including street and city if local pharmacy) is medication to be sent to? [facility] [place], [place]  3. Do they need a 30 day or 90 day supply? 90 day    Patient is completely out of medication. He has an appointment 11/14/2021.

## 2021-11-04 ENCOUNTER — Other Ambulatory Visit: Payer: Self-pay | Admitting: Urology

## 2021-11-04 ENCOUNTER — Telehealth: Payer: Self-pay | Admitting: Cardiovascular Disease

## 2021-11-04 NOTE — Telephone Encounter (Signed)
° °  Patient Name: [name] [name]  DOB: 05-Apr-1946 MRN: [id number]  Primary Cardiologist: Sanda Klein, MD  Chart reviewed as part of pre-operative protocol coverage. The patient has an upcoming visit scheduled with Dr. Sallyanne Kuster on 11/14/2021 at which time clearance can be addressed in case there are any issues that would impact surgical recommendations.  Gold seed markers and space oar not scheduled until 01/06/2022. I added preop FYI to appointment notes that provider is aware to address at time of outpatient visit. Per office protocol the cardiology provider should for their finalize clearance decision and recommendations regarding DOAC therapy to requesting party below.    I will route this message as FYI to requesting party and remove this message from the preop box a separate preop APP input not needed at this time.  Please call with any questions.  Lenna Sciara, NP 11/04/2021, 4:26 PM

## 2021-11-04 NOTE — Progress Notes (Signed)
Pt chart assessed meets [facility] guidelines, cardiac clearance requested for 01-06-2022 surgery.

## 2021-11-04 NOTE — Telephone Encounter (Signed)
° °  Pre-operative Risk Assessment    Patient Name: Mase Dhondt  DOB: 06-20-1946 MRN: [id number]      Request for Surgical Clearance    Procedure:   Gold seed markers and space oar  Date of Surgery:  Clearance 01/06/22                                 Surgeon:  Dr. Roanna Raider  Surgeon's Group or Practice Name:  [facility]  Phone number:  [phone] Fax number:  [phone]   Type of Clearance Requested:   - Medical   Eliquis need stop 48 hrs prior to procedure    Type of Anesthesia:  MAC   Additional requests/questions:      SignedMilbert Coulter   11/04/2021, 3:41 PM

## 2021-11-14 ENCOUNTER — Ambulatory Visit (INDEPENDENT_AMBULATORY_CARE_PROVIDER_SITE_OTHER): Payer: Medicare Other | Admitting: Cardiovascular Disease

## 2021-11-14 ENCOUNTER — Encounter: Payer: Self-pay | Admitting: Cardiovascular Disease

## 2021-11-14 ENCOUNTER — Other Ambulatory Visit: Payer: Self-pay

## 2021-11-14 VITALS — BP 172/78 | HR 71 | Ht 71.0 in | Wt 203.2 lb

## 2021-11-14 DIAGNOSIS — E785 Hyperlipidemia, unspecified: Secondary | ICD-10-CM

## 2021-11-14 DIAGNOSIS — I441 Atrioventricular block, second degree: Secondary | ICD-10-CM

## 2021-11-14 DIAGNOSIS — I480 Paroxysmal atrial fibrillation: Secondary | ICD-10-CM | POA: Diagnosis not present

## 2021-11-14 DIAGNOSIS — Z45010 Encounter for checking and testing of cardiac pacemaker pulse generator [battery]: Secondary | ICD-10-CM | POA: Diagnosis not present

## 2021-11-14 DIAGNOSIS — D6869 Other thrombophilia: Secondary | ICD-10-CM | POA: Diagnosis not present

## 2021-11-14 DIAGNOSIS — I351 Nonrheumatic aortic (valve) insufficiency: Secondary | ICD-10-CM

## 2021-11-14 DIAGNOSIS — I10 Essential (primary) hypertension: Secondary | ICD-10-CM

## 2021-11-14 DIAGNOSIS — E119 Type 2 diabetes mellitus without complications: Secondary | ICD-10-CM

## 2021-11-14 DIAGNOSIS — Z952 Presence of prosthetic heart valve: Secondary | ICD-10-CM

## 2021-11-14 MED ORDER — AMLODIPINE BESYLATE 5 MG PO TABS: 5.0000 mg | ORAL_TABLET | Freq: Every day | ORAL | 3 refills | Status: AC

## 2021-11-14 NOTE — Patient Instructions (Signed)

## 2021-11-14 NOTE — Progress Notes (Signed)
Patient ID: [name] [name], male   DOB: Oct 17, 1945, 76 y.o.   MRN: [id number]    Cardiology Office Note    Date:  11/14/2021   ID:  [name] [name], DOB [birth year], MRN [id number]  PCP:  Patient, No Pcp Per (Inactive)  Cardiologist:   Sanda Klein, MD   No chief complaint on file.   History of Present Illness:  [name] [name] is a 76 y.o. 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   He underwent aortic valve replacement in January 2012 (21 mm bioprosthesis) for symptomatic aortic stenosis. Early on, his prosthesis showed evidence of mild to moderate aortic insufficiency that has not progressed over time. He has preserved left ventricular systolic function.  He did have postoperative atrial fibrillation.  In November 2011 he received a dual-chamber permanent pacemaker for syncope that was likely related to second degree atrioventricular block. He has an MRI conditional Medtronic Revo device (reached ERI in July 2022 and underwent generator change out with a Medtronic Azure XT device). 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  Since his last appointment he has been diagnosed with prostate cancer and is planning to undergo gold seed brachytherapy in May of this year.  His urologist is Dr. Tresa Moore and the implanting physician will be Dr. Rexene Alberts.  He has started Lupron injections and is to start bicalutamide.  Pacemaker interrogation shows normal device function.  He has roughly 30% atrial pacing and only 0.3% ventricular pacing.  He has had a low burden of atrial fibrillation at 0.2% including an 8-hour episode in December and a 90-minute episode on February 20.   Rate control is consistently good.  He has not had any episodes of ventricular tachycardia.  Lead parameters remain excellent. As always he has extensive evidence of infrahisian disease with a very long AV delay (during paced rhythm 348 ms) and bifascicular block (RBBB plus LAFB).  Despite this he requires very little ventricular pacing with MVP programming.  His device showed an episode of fibrillation with controlled ventricular response lasting for about 77 minutes, earlier this year.  In 2021 he had a 4-minute episode of atrial flutter.  In 2012 he had postoperative atrial fibrillation at the time of AVR.  He does not have a history of stroke, TIA or embolic events.  The patient specifically denies any chest pain at rest exertion, dyspnea at rest or with exertion, orthopnea, paroxysmal nocturnal dyspnea, syncope, palpitations, focal neurological deficits, intermittent claudication, lower extremity edema, unexplained weight gain, cough, hemoptysis or wheezing.  Metabolic parameters are very well controlled.  Most recent hemoglobin A1c was 6.1%.  LDL cholesterol was 86, HDL 36 (he does not have known CAD or PAD and did not have significant coronary stenoses at the time of cardiac catheterization before his aortic valve replacement in 2012. He is seeing [name] for endocrinology follow-up in [place].     Past Medical History:  Diagnosis Date   Aortic insufficiency    Atrial fibrillation (HCC)    AV block, 2nd degree    Diabetes (HCC)    Dyslipidemia    Hypertension    LAFB (left anterior fascicular block)    Mitral insufficiency    RBBB    Ventricular tachycardia  Ventricular tachycardia, non-sustained     Past Surgical History:  Procedure Laterality Date   AORTIC VALVE REPLACEMENT  10/06/2010   [facility] Ease prosthesis   BACK SURGERY     CARDIAC CATHETERIZATION  06/30/2010   nonobstructive CAD, heavily ca+ AOV   NM MYOCAR PERF WALL MOTION  02/28/2007   normal   PERMANENT  PACEMAKER INSERTION  07/21/2010   Medtronic   PPM GENERATOR CHANGEOUT N/A 05/23/2021   Procedure: PPM GENERATOR CHANGEOUT;  Surgeon: Sanda Klein, MD;  Location: [facility];  Service: Cardiovascular;  Laterality: N/A;    Outpatient Medications Prior to Visit  Medication Sig Dispense Refill   apixaban (ELIQUIS) 5 MG TABS tablet Take 1 tablet (5 mg total) by mouth 2 (two) times daily. 180 tablet 3   glipiZIDE (GLUCOTROL) 5 MG tablet Take 2.5-5 mg by mouth See admin instructions. 2.5 mg in the morning, 5 mg in the evening     irbesartan (AVAPRO) 300 MG tablet TAKE 1 TABLET (300 MG TOTAL) BY MOUTH DAILY.     metFORMIN (GLUCOPHAGE) 500 MG tablet Take 250-500 mg by mouth See admin instructions. 250 mg in the morning, 500 mg in the evening     metoprolol tartrate (LOPRESSOR) 50 MG tablet TAKE 1 TABLET TWICE A DAY 180 tablet 3   amLODipine (NORVASC) 5 MG tablet Take 1 tablet (5 mg total) by mouth daily. OFFICE VISIT NEEDED (Patient not taking: Reported on 11/14/2021) 90 tablet 0   No facility-administered medications prior to visit.     Allergies:   Patient has no known allergies.   Social History   Socioeconomic History   Marital status: Single    Spouse name: Not on file   Number of children: Not on file   Years of education: Not on file   Highest education level: Not on file  Occupational History   Not on file  Tobacco Use   Smoking status: Never   Smokeless tobacco: Never  Substance and Sexual Activity   Alcohol use: No   Drug use: No   Sexual activity: Not on file  Other Topics Concern   Not on file  Social History Narrative   Not on file   Social Determinants of Health   Financial Resource Strain: Not on file  Food Insecurity: Not on file  Transportation Needs: Not on file  Physical Activity: Not on file  Stress: Not on file  Social Connections: Not on file     Family History:  The patient's brother recently underwent prostatectomy for prostate cancer  ROS:    Please see the history of present illness.    ROS All other systems are reviewed and are negative.   PHYSICAL EXAM:   VS:  BP (!) 172/78    Pulse 71    Ht '5\' 11"'$  (1.803 m)    Wt 203 lb 3.2 oz (92.2 kg)    SpO2 98%    BMI 28.34 kg/m      General: Alert, oriented x3, no distress, well-healed left subclavian pacemaker site Head: no evidence of trauma, PERRL, EOMI, no exophtalmos or lid lag, no myxedema, no xanthelasma; normal ears, nose and oropharynx Neck: normal jugular venous pulsations and no hepatojugular reflux; brisk carotid pulses without delay and no carotid bruits Chest: clear to auscultation, no signs of consolidation by percussion or palpation, normal fremitus, symmetrical and full respiratory excursions Cardiovascular: normal position and quality of the apical impulse, regular rhythm, normal first and widely split second heart sounds, 1/6  aortic ejection murmur and 1/6 decrescendo diastolic murmur best heard at the left lower sternal border, no rubs or gallops Abdomen: no tenderness or distention, no masses by palpation, no abnormal pulsatility or arterial bruits, normal bowel sounds, no hepatosplenomegaly Extremities: no clubbing, cyanosis or edema; 2+ radial, ulnar and brachial pulses bilaterally; 2+ right femoral, posterior tibial and dorsalis pedis pulses; 2+ left femoral, posterior tibial and dorsalis pedis pulses; no subclavian or femoral bruits Neurological: grossly nonfocal Psych: Normal mood and affect    Wt Readings from Last 3 Encounters:  11/14/21 203 lb 3.2 oz (92.2 kg)  10/25/21 203 lb 3.2 oz (92.2 kg)  05/23/21 214 lb (97.1 kg)      Studies/Labs Reviewed:  Echocardiogram 12/25/2019  1. Left ventricular ejection fraction, by estimation, is 60 to 65%. The left ventricle has normal function. Left ventricular endocardial border not optimally defined to evaluate regional wall motion. There is moderate asymmetric left ventricular hypertrophy of the basal-septal  segment. Left ventricular diastolic parameters are consistent with Grade I diastolic dysfunction (impaired relaxation).   2. Right ventricular systolic function is mildly reduced. The right  ventricular size is mildly enlarged.   3. Left atrial size was moderately dilated.   4. The mitral valve is degenerative. Mild mitral valve regurgitation. No evidence of mitral stenosis.   5. The aortic valve has been repaired/replaced. Aortic valve  regurgitation is mild. There is a 21 mm Magna pericardial valve present in the aortic position. Procedure Date: 10/06/2010. Aortic valve mean gradient measures 11.0 mmHg.   6. Aortic dilatation noted. There is mild dilatation of the ascending aorta measuring 40 mm.   Comparison(s): A prior study was performed on 11/29/2016. No significant change from prior study. Prior images reviewed side by side.   EKG:  EKG is ordered today.  Personally reviewed, shows atrial paced, ventricular sensed rhythm with a very long AV delay of 348 ms and bifascicular block (RBBB plus LAFB), QTc 445 ms   Recent Labs: 08/09/2021 Creatinine 0.7, potassium 5.3   Lipid Panel    Component Value Date/Time   CHOL 142 12/27/2020 1151   TRIG 108 12/27/2020 1151   HDL 36 (L) 12/27/2020 1151   CHOLHDL 3.9 12/27/2020 1151   CHOLHDL 4.3 11/28/2016 0919   VLDL 23 11/28/2016 0919   LDLCALC 86 12/27/2020 1151   03 29 2021 Cholesterol 137, HDL 39, LDL 83, triglycerides 77 Hemoglobin A1c 5.9% Creatinine 0.86, potassium 4.8, normal liver function tests  12/27/2020 Cholesterol 142, HDL 36, LDL 86, triglycerides 108 Hemoglobin A1c 6.1% Creatinine 0.95, potassium 4.6, normal liver function tests  ASSESSMENT:    1. Paroxysmal atrial fibrillation (HCC)   2. Second degree AV block, Mobitz type I   3. Pacemaker battery depletion   4. Acquired thrombophilia ([place])   5. S/P bioprosthetic AVR - 21 mm [facility], Dr. [name] 09/2010   6. Nonrheumatic aortic valve insufficiency   7.  Essential hypertension   8. Type 2 diabetes mellitus without complication, without long-term current use of insulin ([place])   9. Dyslipidemia (high LDL; low HDL)      PLAN:  In order of problems listed above:   PAF: Very low burden of arrhythmia which is asymptomatic and well rate controlled CHA2DS2-VASc 4 (age 74, hypertension, diabetes) he has no history of embolic events.  On anticoagulation. Second degree AV block: He has the equivalent of trifascicular block, but fortunately has very little ventricular pacing.   Pacemaker: Not dependent.  Normal device function.  Recent  generator change out with battery at beginning of service.  Would not need any reprogramming if he undergoes radiation therapy. Anticoagulation: Denies any bleeding complications.  We will have to stop his anticoagulant for 2 days before his prostate radioactive seed implants. S/P AVR with unexpected degree of aortic insufficiency, but this has been stable over time.  No evidence of CHF.  No progression in the severity of aortic insufficiency.  Plan to reevaluate in another 2 years.  He should receive antibiotic prophylaxis before invasive procedures. HTN: His amlodipine prescription ran out and this explains why his blood pressure is high.  We will refill this today. DM: Very well controlled. HLP: DL in target range on statin.  Advise more physical activity and weight loss to help with his low HDL.  He does not have known CAD or PAD.  Target LDL less than 100   Medication Adjustments/Labs and Tests Ordered: Current medicines are reviewed at length with the patient today.  Concerns regarding medicines are outlined above.  Medication changes, Labs and Tests ordered today are listed in the Patient Instructions below. Patient Instructions  Medication Instructions:  No changes *If you need a refill on your cardiac medications before your next appointment, please call your pharmacy*   Lab Work: None ordered If you have labs  (blood work) drawn today and your tests are completely normal, you will receive your results only by: [place] (if you have MyChart) OR A paper copy in the mail If you have any lab test that is abnormal or we need to change your treatment, we will call you to review the results.   Testing/Procedures: None ordered   Follow-Up: At [facility], you and your health needs are our priority.  As part of our continuing mission to provide you with exceptional heart care, we have created designated Provider Care Teams.  These Care Teams include your primary Cardiologist (physician) and Advanced Practice Providers (APPs -  Physician Assistants and Nurse Practitioners) who all work together to provide you with the care you need, when you need it.  We recommend signing up for the patient portal called "MyChart".  Sign up information is provided on this After Visit Summary.  MyChart is used to connect with patients for Virtual Visits (Telemedicine).  Patients are able to view lab/test results, encounter notes, upcoming appointments, etc.  Non-urgent messages can be sent to your provider as well.   To learn more about what you can do with MyChart, go to [email or web address].    Your next appointment:   12 month(s)  The format for your next appointment:   In Person  Provider:   Sanda Klein, MD {       Signed, Sanda Klein, MD  11/14/2021 2:59 PM    [address], [place], [place]  [address] Phone: [phone]; Fax: [phone]

## 2021-11-17 ENCOUNTER — Encounter: Payer: Self-pay | Admitting: Urology

## 2021-11-17 NOTE — Progress Notes (Signed)
Patient started Biclutamide ADT 11/07/21 and got a 6 month Lupron injection 11/16/21. Scheduled for fiducial markers and SpaceOAR gel 01/17/22 and CT SIM to follow on 01/19/22. ? ?Nicholos Johns, MMS, PA-C ?[place] at [facility] ?Radiation Oncology Physician Assistant ?Direct Dial: [phone]  Fax: [phone] ? ?

## 2021-11-21 ENCOUNTER — Ambulatory Visit (INDEPENDENT_AMBULATORY_CARE_PROVIDER_SITE_OTHER): Payer: Medicare Other

## 2021-11-21 DIAGNOSIS — I441 Atrioventricular block, second degree: Secondary | ICD-10-CM | POA: Diagnosis not present

## 2021-11-22 ENCOUNTER — Telehealth: Payer: Self-pay | Admitting: Cardiovascular Disease

## 2021-11-22 NOTE — Telephone Encounter (Signed)
Patient's brother states he received a call about the patient sending a manual transmission. He states the message cut off and did not get the full thing. He states to call the patient back at: [phone] ?

## 2021-11-23 LAB — CUP PACEART REMOTE DEVICE CHECK
Battery Remaining Longevity: 168 mo
Battery Voltage: 3.18 V
Brady Statistic AP VP Percent: 0.15 %
Brady Statistic AP VS Percent: 60.29 %
Brady Statistic AS VP Percent: 0.07 %
Brady Statistic AS VS Percent: 39.49 %
Brady Statistic RA Percent Paced: 60.54 %
Brady Statistic RV Percent Paced: 0.22 %
Date Time Interrogation Session: 20230315011702
Implantable Lead Implant Date: 20111110
Implantable Lead Implant Date: 20111110
Implantable Lead Location: 753859
Implantable Lead Location: 753860
Implantable Pulse Generator Implant Date: 20220912
Lead Channel Impedance Value: 323 Ohm
Lead Channel Impedance Value: 399 Ohm
Lead Channel Impedance Value: 513 Ohm
Lead Channel Impedance Value: 570 Ohm
Lead Channel Pacing Threshold Amplitude: 0.875 V
Lead Channel Pacing Threshold Amplitude: 1.25 V
Lead Channel Pacing Threshold Pulse Width: 0.4 ms
Lead Channel Pacing Threshold Pulse Width: 0.4 ms
Lead Channel Sensing Intrinsic Amplitude: 1.75 mV
Lead Channel Sensing Intrinsic Amplitude: 1.75 mV
Lead Channel Sensing Intrinsic Amplitude: 3.25 mV
Lead Channel Sensing Intrinsic Amplitude: 3.25 mV
Lead Channel Setting Pacing Amplitude: 1.75 V
Lead Channel Setting Pacing Amplitude: 2.75 V
Lead Channel Setting Pacing Pulse Width: 0.4 ms
Lead Channel Setting Sensing Sensitivity: 1.2 mV

## 2021-11-23 NOTE — Telephone Encounter (Signed)
LMOVM to let the patient and his brother know that I did not see a phone. I do not know who gave the patient a call and asked for a transmission. I do see a transmission for 11-23-2021. I left my direct number for them to call back if they have any further questions. ?

## 2021-12-05 NOTE — Progress Notes (Signed)
Remote pacemaker transmission.   

## 2022-01-09 ENCOUNTER — Ambulatory Visit: Payer: Medicare Other | Admitting: Radiation Oncology

## 2022-01-13 ENCOUNTER — Encounter (HOSPITAL_BASED_OUTPATIENT_CLINIC_OR_DEPARTMENT_OTHER): Payer: Self-pay | Admitting: Urology

## 2022-01-13 ENCOUNTER — Other Ambulatory Visit: Payer: Self-pay

## 2022-01-13 NOTE — Progress Notes (Addendum)
ADDENDUM:  Device orders requested via inbox message to heartcare in epic on 01-12-2022 and second sent today 01-16-2022. No response.  Also, called main heart care number to verify device clinic office number, [phone] and [phone], tried both number's no answer and voicemail to leave message.  Informed [name] , [name]. ? ? ?Spoke w/ via phone for pre-op interview--- pt ?Lab needs dos----  istat             ?Lab results------ current ekg in epic/ chart ?COVID test -----patient states asymptomatic no test needed ?Arrive at ------- 0830 on 01-17-2022 ?NPO after MN NO Solid Food.  Clear liquids from MN until--- 0730 ? ?Med rec completed ?Medications to take morning of surgery ----- lopressor, norvasc, lipitor  ?Diabetic medication ----- do not take metformin/ glipizide morning of surgery ? ?Patient instructed to bring photo id and insurance card day of surgery ?Patient aware to have Driver (ride ) / caregiver   for 24 hours after surgery --- brother, [name] [name] ?Patient Special Instructions ----- will do one fleet enema night before surgery ? ?Pre-Op special Istructions ----- pt has cardiac clearance office note and letter dated 11-14-2021 by Dr Sallyanne Kuster in epic and copy on chart. ?Device orders requested via inbox message to [place] [facility] in epic on 01-12-2022. ? ?Patient verbalized understanding of instructions that were given at this phone interview. ?Patient denies shortness of breath, chest pain, fever, cough at this phone interview.  ? ? ?Anesthesia Review:  HTN;  PAF ; hx NSTEMI 10/ 2011 in setting syncope, nonsustained VT, severe aortic stenosis, second degree AV block;  non-obstruction CAD;  PPM 11/ 2011;  s/p AVR 01/ 2012;  DM2 ?Pt denies any cardiac s&s, sob, and no peripheral swelling ? ?PCP:  none ?Cardiologist : Dr Sallyanne Kuster (lov 11-14-2021 epic) ?Endocrinologist:  Dr D. Veda Canning 11-15-2021 epic ?EKG :  11-14-2021 ?Echo : 12-25-2019 ?Stress test: 02-28-2007 ?Cardiac Cath :  06-30-2010 ?Activity level: denies sob w/ any activity ?Sleep Study/ CPAP : NO ?Fasting Blood Sugar :      / Checks Blood Sugar -- times a day:  occasionally checks ?Blood Thinner/ Instructions /Last Dose: Eliquis ?ASA / Instructions/ Last Dose :  NO ?Per pt was given instructions from cardiologist to stop 2 days prior to surgery, stated last dose will be Sunday night 01-15-2022 ?

## 2022-01-16 ENCOUNTER — Encounter: Payer: Self-pay | Admitting: Cardiovascular Disease

## 2022-01-16 ENCOUNTER — Other Ambulatory Visit: Payer: Self-pay | Admitting: Urology

## 2022-01-16 NOTE — Progress Notes (Signed)
PERIOPERATIVE PRESCRIPTION FOR IMPLANTED CARDIAC DEVICE PROGRAMMING ? ?Patient Information: ?Name:  [name] [name]  ?DOB:  10/02/1945  ?MRN:  [id number]  ?  ?Planned Procedure: Prostate gold seed implant and insert space oar  ?Surgeon:  Dr Rexene Alberts  ?Date of Procedure:  01-17-2022  ?Cautery will be used.  ?Position during surgery:  lithotomy  ? ?Please send documentation back to:  ?[place] (Fax # [phone])   ?Device Information: ? ?Clinic EP Physician:  Dr. Sallyanne Kuster  ? ?Device Type:  Pacemaker ?Manufacturer and Phone #:  Medtronic: [phone] ?Pacemaker Dependent?:  No. ?Date of Last Device Check:  11/23/21 Remote Normal Device Function?:  Yes.   ? ?Electrophysiologist's Recommendations: ? ?Have magnet available. ?Provide continuous ECG monitoring when magnet is used or reprogramming is to be performed.  ?Procedure should not interfere with device function.  No device programming or magnet placement needed. ? ?Per Device Clinic Standing Orders, ?Wanda Plump, RN  ?3:32 PM 01/16/2022  ?

## 2022-01-17 ENCOUNTER — Ambulatory Visit (HOSPITAL_BASED_OUTPATIENT_CLINIC_OR_DEPARTMENT_OTHER): Payer: Medicare Other | Admitting: Anesthesiology

## 2022-01-17 ENCOUNTER — Other Ambulatory Visit: Payer: Self-pay

## 2022-01-17 ENCOUNTER — Ambulatory Visit (HOSPITAL_BASED_OUTPATIENT_CLINIC_OR_DEPARTMENT_OTHER)
Admission: RE | Admit: 2022-01-17 | Discharge: 2022-01-17 | Disposition: A | Discharge status: Home / Self Care | Payer: Medicare Other | Attending: Urology | Admitting: Urology

## 2022-01-17 ENCOUNTER — Encounter (HOSPITAL_BASED_OUTPATIENT_CLINIC_OR_DEPARTMENT_OTHER): Payer: Self-pay | Admitting: Urology

## 2022-01-17 ENCOUNTER — Encounter (HOSPITAL_BASED_OUTPATIENT_CLINIC_OR_DEPARTMENT_OTHER)
Admission: RE | Disposition: A | Discharge status: Home / Self Care | Payer: Self-pay | Source: Home / Self Care | Attending: Urology

## 2022-01-17 DIAGNOSIS — Z8042 Family history of malignant neoplasm of prostate: Secondary | ICD-10-CM | POA: Insufficient documentation

## 2022-01-17 DIAGNOSIS — I252 Old myocardial infarction: Secondary | ICD-10-CM | POA: Insufficient documentation

## 2022-01-17 DIAGNOSIS — Z87891 Personal history of nicotine dependence: Secondary | ICD-10-CM | POA: Insufficient documentation

## 2022-01-17 DIAGNOSIS — E782 Mixed hyperlipidemia: Secondary | ICD-10-CM | POA: Diagnosis not present

## 2022-01-17 DIAGNOSIS — E119 Type 2 diabetes mellitus without complications: Secondary | ICD-10-CM | POA: Insufficient documentation

## 2022-01-17 DIAGNOSIS — Z950 Presence of cardiac pacemaker: Secondary | ICD-10-CM | POA: Insufficient documentation

## 2022-01-17 DIAGNOSIS — C61 Malignant neoplasm of prostate: Secondary | ICD-10-CM

## 2022-01-17 DIAGNOSIS — I480 Paroxysmal atrial fibrillation: Secondary | ICD-10-CM | POA: Insufficient documentation

## 2022-01-17 DIAGNOSIS — Z7901 Long term (current) use of anticoagulants: Secondary | ICD-10-CM | POA: Insufficient documentation

## 2022-01-17 DIAGNOSIS — Z7984 Long term (current) use of oral hypoglycemic drugs: Secondary | ICD-10-CM | POA: Insufficient documentation

## 2022-01-17 DIAGNOSIS — I2510 Atherosclerotic heart disease of native coronary artery without angina pectoris: Secondary | ICD-10-CM | POA: Insufficient documentation

## 2022-01-17 DIAGNOSIS — I10 Essential (primary) hypertension: Secondary | ICD-10-CM | POA: Insufficient documentation

## 2022-01-17 DIAGNOSIS — Z952 Presence of prosthetic heart valve: Secondary | ICD-10-CM | POA: Insufficient documentation

## 2022-01-17 DIAGNOSIS — Z79899 Other long term (current) drug therapy: Secondary | ICD-10-CM | POA: Insufficient documentation

## 2022-01-17 HISTORY — DX: Bifascicular block: I45.2

## 2022-01-17 HISTORY — DX: Mixed hyperlipidemia: E78.2

## 2022-01-17 HISTORY — DX: Benign prostatic hyperplasia with lower urinary tract symptoms: N40.1

## 2022-01-17 HISTORY — PX: SPACE OAR INSTILLATION: SHX6769

## 2022-01-17 HISTORY — DX: Presence of spectacles and contact lenses: Z97.3

## 2022-01-17 HISTORY — DX: Type 2 diabetes mellitus without complications: E11.9

## 2022-01-17 HISTORY — DX: Long term (current) use of anticoagulants: Z79.01

## 2022-01-17 HISTORY — DX: Type 2 diabetes mellitus with mild nonproliferative diabetic retinopathy without macular edema, bilateral: E11.3293

## 2022-01-17 HISTORY — DX: Nonrheumatic aortic (valve) insufficiency: I35.1

## 2022-01-17 HISTORY — DX: Tinnitus, bilateral: H93.13

## 2022-01-17 HISTORY — DX: Paroxysmal atrial fibrillation: I48.0

## 2022-01-17 HISTORY — PX: GOLD SEED IMPLANT: SHX6343

## 2022-01-17 HISTORY — DX: Cardiac murmur, unspecified: R01.1

## 2022-01-17 HISTORY — DX: Atherosclerotic heart disease of native coronary artery without angina pectoris: I25.10

## 2022-01-17 LAB — POCT I-STAT, CHEM 8
BUN: 23 mg/dL (ref 8–23)
Calcium, Ion: 1.13 mmol/L — ABNORMAL LOW (ref 1.15–1.40)
Chloride: 106 mmol/L (ref 98–111)
Creatinine, Ser: 0.8 mg/dL (ref 0.61–1.24)
Glucose, Bld: 126 mg/dL — ABNORMAL HIGH (ref 70–99)
HCT: 46 % (ref 39.0–52.0)
Hemoglobin: 15.6 g/dL (ref 13.0–17.0)
Potassium: 3.4 mmol/L — ABNORMAL LOW (ref 3.5–5.1)
Sodium: 142 mmol/L (ref 135–145)
TCO2: 24 mmol/L (ref 22–32)

## 2022-01-17 LAB — GLUCOSE, CAPILLARY: Glucose-Capillary: 131 mg/dL — ABNORMAL HIGH (ref 70–99)

## 2022-01-17 SURGERY — INSERTION, GOLD SEEDS
Anesthesia: Monitor Anesthesia Care

## 2022-01-17 MED ORDER — ACETAMINOPHEN 500 MG PO TABS
1000.0000 mg | ORAL_TABLET | Freq: Once | ORAL | Status: AC
  Administered 2022-01-17: 1000 mg via ORAL

## 2022-01-17 MED ORDER — SODIUM CHLORIDE (PF) 0.9 % IJ SOLN
INTRAMUSCULAR | Status: DC | PRN
  Administered 2022-01-17: 10 mL

## 2022-01-17 MED ORDER — METOPROLOL TARTRATE 5 MG/5ML IV SOLN
INTRAVENOUS | Status: DC | PRN
  Administered 2022-01-17: 5 mg via INTRAVENOUS

## 2022-01-17 MED ORDER — PROPOFOL 10 MG/ML IV BOLUS
INTRAVENOUS | Status: DC | PRN
  Administered 2022-01-17: 30 mg via INTRAVENOUS

## 2022-01-17 MED ORDER — FENTANYL CITRATE (PF) 100 MCG/2ML IJ SOLN
INTRAMUSCULAR | Status: AC
  Filled 2022-01-17: qty 2

## 2022-01-17 MED ORDER — ONDANSETRON HCL 4 MG/2ML IJ SOLN
INTRAMUSCULAR | Status: AC
  Filled 2022-01-17: qty 2

## 2022-01-17 MED ORDER — CEFAZOLIN SODIUM-DEXTROSE 2-4 GM/100ML-% IV SOLN: 2.0000 g | INTRAVENOUS | Status: DC

## 2022-01-17 MED ORDER — PROPOFOL 1000 MG/100ML IV EMUL
INTRAVENOUS | Status: AC
  Filled 2022-01-17: qty 100

## 2022-01-17 MED ORDER — LACTATED RINGERS IV SOLN: INTRAVENOUS | Status: DC

## 2022-01-17 MED ORDER — ACETAMINOPHEN 500 MG PO TABS
ORAL_TABLET | ORAL | Status: AC
  Filled 2022-01-17: qty 2

## 2022-01-17 MED ORDER — CEFAZOLIN SODIUM-DEXTROSE 2-4 GM/100ML-% IV SOLN
INTRAVENOUS | Status: AC
  Filled 2022-01-17: qty 100

## 2022-01-17 MED ORDER — ONDANSETRON HCL 4 MG/2ML IJ SOLN
INTRAMUSCULAR | Status: DC | PRN
  Administered 2022-01-17: 4 mg via INTRAVENOUS

## 2022-01-17 MED ORDER — BUPIVACAINE-EPINEPHRINE (PF) 0.25% -1:200000 IJ SOLN
INTRAMUSCULAR | Status: DC | PRN
  Administered 2022-01-17: 10 mL

## 2022-01-17 MED ORDER — PROPOFOL 500 MG/50ML IV EMUL
INTRAVENOUS | Status: DC | PRN
  Administered 2022-01-17: 200 ug/kg/min via INTRAVENOUS

## 2022-01-17 MED ORDER — FENTANYL CITRATE (PF) 100 MCG/2ML IJ SOLN
INTRAMUSCULAR | Status: DC | PRN
  Administered 2022-01-17 (×2): 50 ug via INTRAVENOUS

## 2022-01-17 SURGICAL SUPPLY — 26 items
BLADE CLIPPER SENSICLIP SURGIC (BLADE) ×3 IMPLANT
CNTNR URN SCR LID CUP LEK RST (MISCELLANEOUS) ×2 IMPLANT
CONT SPEC 4OZ STRL OR WHT (MISCELLANEOUS) ×2
COVER BACK TABLE 60X90IN (DRAPES) ×3 IMPLANT
DRSG TEGADERM 4X4.75 (GAUZE/BANDAGES/DRESSINGS) ×3 IMPLANT
DRSG TEGADERM 8X12 (GAUZE/BANDAGES/DRESSINGS) ×3 IMPLANT
GAUZE SPONGE 4X4 12PLY STRL (GAUZE/BANDAGES/DRESSINGS) ×3 IMPLANT
GAUZE SPONGE 4X4 3PLY NS LF (GAUZE/BANDAGES/DRESSINGS) ×1 IMPLANT
GLOVE BIO SURGEON STRL SZ7.5 (GLOVE) ×3 IMPLANT
GLOVE ECLIPSE 8.0 STRL XLNG CF (GLOVE) ×3 IMPLANT
GLOVE SURG ORTHO 8.5 STRL (GLOVE) ×3 IMPLANT
IMPL SPACEOAR VUE SYSTEM (Spacer) ×2 IMPLANT
IMPLANT SPACEOAR VUE SYSTEM (Spacer) ×2 IMPLANT
KIT TURNOVER CYSTO (KITS) ×3 IMPLANT
MARKER GOLD PRELOAD 1.2X3 (Urological Implant) ×2 IMPLANT
MARKER SKIN DUAL TIP RULER LAB (MISCELLANEOUS) ×3 IMPLANT
NDL SPNL 22GX3.5 QUINCKE BK (NEEDLE) ×2 IMPLANT
NEEDLE SPNL 22GX3.5 QUINCKE BK (NEEDLE) ×2 IMPLANT
SEED GOLD PRELOAD 1.2X3 (Urological Implant) ×2 IMPLANT
SHEATH ULTRASOUND LF (SHEATH) IMPLANT
SHEATH ULTRASOUND LTX NONSTRL (SHEATH) IMPLANT
SURGILUBE 2OZ TUBE FLIPTOP (MISCELLANEOUS) ×3 IMPLANT
SYR 10ML LL (SYRINGE) ×3 IMPLANT
SYR CONTROL 10ML LL (SYRINGE) ×3 IMPLANT
TOWEL OR 17X26 10 PK STRL BLUE (TOWEL DISPOSABLE) ×3 IMPLANT
UNDERPAD 30X36 HEAVY ABSORB (UNDERPADS AND DIAPERS) ×3 IMPLANT

## 2022-01-17 NOTE — Anesthesia Preprocedure Evaluation (Addendum)
Anesthesia Evaluation  ?Patient identified by MRN, date of birth, ID band ?Patient awake ? ? ? ?Reviewed: ?Allergy & Precautions, NPO status , Patient's Chart, lab work & pertinent test results ? ?History of Anesthesia Complications ?Negative for: history of anesthetic complications ? ?Airway ?Mallampati: I ? ?TM Distance: >3 FB ?Neck ROM: Full ? ? ? Dental ? ?(+) Dental Advisory Given ?  ?Pulmonary ?former smoker,  ?  ?breath sounds clear to auscultation ? ? ? ? ? ? Cardiovascular ?hypertension, Pt. on medications and Pt. on home beta blockers ?(-) angina+ dysrhythmias Ventricular Tachycardia + pacemaker + Valvular Problems/Murmurs (s/p AVR for bicuspid AV)  ?Rhythm:Regular Rate:Tachycardia ?+ Systolic murmurs ?'21 ECHO: EF 60 to 65%. The LV has normal function, moderate asymmetric left ventricular  ?hypertrophy of the basal-septal segment. Grade I diastolic dysfunction (impaired  ?relaxation).  ?RV systolic function is mildly reduced, mildly enlarged.  ?MV is degenerative. Mild MR. No MS.  ?AV has been repaired/replaced. AI is mild. There is a 21 mm Magna pericardial valve present in  ?the aortic position. Procedure Date: 10/06/2010. Aortic valve mean gradient measures 11.0 mmHg.  ?  ?Neuro/Psych ?negative neurological ROS ?   ? GI/Hepatic ?negative GI ROS, Neg liver ROS,   ?Endo/Other  ?diabetes (glu 126), Oral Hypoglycemic Agents ? Renal/GU ?  ? ?  ?Musculoskeletal ? ? Abdominal ?  ?Peds ? Hematology ?eliquis   ?Anesthesia Other Findings ? ? Reproductive/Obstetrics ? ?  ? ? ? ? ? ? ? ? ? ? ? ? ? ?  ?  ? ? ? ? ? ? ?Anesthesia Physical ?Anesthesia Plan ? ?ASA: 3 ? ?Anesthesia Plan: MAC  ? ?Post-op Pain Management: Tylenol PO (pre-op)*  ? ?Induction:  ? ?PONV Risk Score and Plan: 1 and Ondansetron and Treatment may vary due to age or medical condition ? ?Airway Management Planned: Natural Airway and Simple Face Mask ? ?Additional Equipment: None ? ?Intra-op Plan:  ? ?Post-operative  Plan:  ? ?Informed Consent: I have reviewed the patients History and Physical, chart, labs and discussed the procedure including the risks, benefits and alternatives for the proposed anesthesia with the patient or authorized representative who has indicated his/her understanding and acceptance.  ? ? ? ?Dental advisory given ? ?Plan Discussed with: CRNA and Surgeon ? ?Anesthesia Plan Comments:   ? ? ? ? ? ?Anesthesia Quick Evaluation ? ?

## 2022-01-17 NOTE — Discharge Instructions (Addendum)
Activity:  You are encouraged to ambulate frequently (about every hour during waking hours) to help prevent blood clots from forming in your legs or lungs.    Diet: You should advance your diet as instructed by your physician.  It will be normal to have some bloating, nausea, and abdominal discomfort intermittently.  Prescriptions:  You will be provided a prescription for pain medication to take as needed.  If your pain is not severe enough to require the prescription pain medication, you may take extra strength Tylenol instead which will have less side effects.  You should also take a prescribed stool softener to avoid straining with bowel movements as the prescription pain medication may constipate you.  What to call us about: You should call the office ([phone]) if you develop fever > 101 or develop persistent vomiting. Activity:  You are encouraged to ambulate frequently (about every hour during waking hours) to help prevent blood clots from forming in your legs or lungs.   Post Anesthesia Home Care Instructions  Activity: Get plenty of rest for the remainder of the day. A responsible adult should stay with you for 24 hours following the procedure.  For the next 24 hours, DO NOT: -Drive a car -Operate machinery -Drink alcoholic beverages -Take any medication unless instructed by your physician -Make any legal decisions or sign important papers.  Meals: Start with liquid foods such as gelatin or soup. Progress to regular foods as tolerated. Avoid greasy, spicy, heavy foods. If nausea and/or vomiting occur, drink only clear liquids until the nausea and/or vomiting subsides. Call your physician if vomiting continues.  Special Instructions/Symptoms: Your throat may feel dry or sore from the anesthesia or the breathing tube placed in your throat during surgery. If this causes discomfort, gargle with warm salt water. The discomfort should disappear within 24 hours.     

## 2022-01-17 NOTE — Transfer of Care (Signed)
Immediate Anesthesia Transfer of Care Note ? ?Patient: [name] [name] ? ?Procedure(s) Performed: GOLD SEED IMPLANT ?SPACE OAR INSTILLATION ? ?Patient Location: PACU ? ?Anesthesia Type:MAC ? ?Level of Consciousness: awake, alert , oriented and patient cooperative ? ?Airway & Oxygen Therapy: Patient Spontanous Breathing ? ?Post-op Assessment: Report given to RN and Post -op Vital signs reviewed and stable ? ?Post vital signs: Reviewed and stable ? ?Last Vitals:  ?Vitals Value Taken Time  ?BP 105/67 01/17/22 1037  ?Temp    ?Pulse 80 01/17/22 1036  ?Resp 15 01/17/22 1038  ?SpO2 99 % 01/17/22 1036  ?Vitals shown include unvalidated device data. ? ?Last Pain:  ?Vitals:  ? 01/17/22 0907  ?TempSrc: Oral  ?PainSc: 0-No pain  ?   ? ?Patients Stated Pain Goal: 5 (01/17/22 0165) ? ?Complications: No notable events documented. ?

## 2022-01-17 NOTE — Anesthesia Postprocedure Evaluation (Signed)
Anesthesia Post Note ? ?Patient: [name] [name] ? ?Procedure(s) Performed: GOLD SEED IMPLANT ?SPACE OAR INSTILLATION ? ?  ? ?Patient location during evaluation: PACU ?Anesthesia Type: MAC ?Level of consciousness: awake and alert, patient cooperative and oriented ?Pain management: pain level controlled ?Vital Signs Assessment: post-procedure vital signs reviewed and stable ?Respiratory status: nonlabored ventilation, spontaneous breathing and respiratory function stable ?Cardiovascular status: stable and blood pressure returned to baseline ?Postop Assessment: no apparent nausea or vomiting and adequate PO intake ?Anesthetic complications: no ? ? ?No notable events documented. ? ?Last Vitals:  ?Vitals:  ? 01/17/22 1041 01/17/22 1051  ?BP:    ?Pulse:  77  ?Resp: 11 18  ?Temp: 36.6 ?C   ?SpO2:  98%  ?  ?Last Pain:  ?Vitals:  ? 01/17/22 0907  ?TempSrc: Oral  ?PainSc: 0-No pain  ? ? ?  ?  ?  ?  ?  ?  ? ?[name] [name],[name] [name] ? ? ? ? ?

## 2022-01-17 NOTE — Op Note (Signed)
Operative Note ? ?Preoperative diagnosis:  ?1.  Clinically localized adenocarcinoma of the prostate ? ?Postoperative diagnosis: ?1.  Clinically localized adenocarcinoma of the prostate ? ?Procedure(s): ?1. Placement of fiducial markers into prostate ?2. Insertion of SpaceOAR hydrogel  ? ?Surgeon: Rexene Alberts, MD ? ?Assistants:  None ? ?Anesthesia:  General ? ?Complications:  None ? ?EBL:  Minimal ? ?Specimens: ?1. None ? ?Drains/Catheters: ?1.  None ? ?Indication:  [name] is a 76 y.o. male with clinically localized prostate cancer. After discussing management options for treatment, he elected to proceed with radiotherapy. He presents today for the above procedures. The potential risks, complications, alternative options, and expected recovery course have been discussed in detail with the patient and he has provided informed consent to proceed. ? ?Description of procedure: ?The patient was administered preoperative antibiotics, placed in the dorsal lithotomy position, and prepped and draped in the usual sterile fashion. Next, transrectal ultrasonography was utilized to visualize the prostate. Three gold fiducial markers were then placed into the prostate via transperineal needles under ultrasound guidance at the right apex, right base, and left mid gland under direct ultrasound guidance. A site in the midline was then selected on the perineum for placement of an 18 g needle with saline. The needle was advanced above the rectum and below Denonvillier's fascia to the mid gland and confirmed to be in the midline on transverse imaging. One cc of saline was injected confirming appropriate expansion of this space. A total of 5 cc of saline was then injected to open the space further bilaterally. The saline syringe was then removed and the SpaceOAR hydrogel was injected with good distribution bilaterally. He tolerated the procedure well and without complications. He was given a voiding trial prior to discharge from  the PACU. ? ?Matt R. Kyrollos Cordell MD ?[facility]  ?Pager: [phone] ? ?

## 2022-01-17 NOTE — H&P (Signed)
?CC/HPI: 1 - Large Volume Prostate Cancer - 8/12 cores up to 50% grade 3 cancer by BX 07/2021 on eval PSA 12.8 12/2020 at age 76. He does have substnatial but well controlled CV and metabolic comorbidity but very well controlled. DRE 02/2021 60gm slight Rt apical induration. TRUS BX 07/2021 56 mL, No median. CT, BS 08/2021 clinically localized. Pt's younger brother with prostate cancer treated with surgery with [place] years ago. Planning on primary external beam radiation with 52mo androgen deprivaiton.  ? ?PMH sig for AVR/AFib/Pacer/Eliquus (follows M. Croitoru MD [facility]), DM2 (A1c 6). 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 His PCP is EKandra NicolasMD.  ? ?11/07/2021: " MRonalee Belts" is seen in f/u above. He had productive meeting with MGari Crownof rad-onc and has fiducial marker / SPACE-OAR placement scheduled. Needs to get set up with 6 mos androgen deprivation.  ? ?01/09/2022: Converted to 618-monthligard ADT on March 8th. No longer on bicalutamide. Here today for preoperative appointment prior to undergoing fiducial marker placement, SpaceOAR on 5/9 in anticipation of starting XRT under the guidance of radiation oncology shortly thereafter. Patient continues Eliquis, he will stop the Eliquis approximately 2 days prior to the procedure. Overall doing well. Denies any changes in past medical history, prescription medications taken on a daily basis, no interval surgical or procedural intervention. He has tolerated ADT fairly well without significantly bothersome fatigue or hot flashes which do occur but these are minimal and not limiting. No changes in baseline lower urinary tract symptoms. He has had no interval dysuria or gross hematuria, no interval treatment for UTI. He denies any recent fevers or chills, nausea/vomiting, denies any chest pain, shortness of breath, lightheadedness or dizziness in the extremities.  ? ?   ?ALLERGIES: None  ? ?MEDICATIONS: Metoprolol Succinate 50 mg tablet, extended release 24 hr  ?Amlodipine Besylate 5 mg tablet  ?Atorvastatin Calcium 40 mg tablet  ?Eliquis 5 mg tablet  ?Glipizide 5 mg tablet  ?Irbesartan 300 mg tablet  ?Metformin Er Gastric 500 mg tablet, er gastric retention 24 hr  ?  ? ?GU PSH: Locm 300-'399Mg'$ /Ml Iodine,1Ml - 08/26/2021 ?Prostate Needle Biopsy - 08/01/2021 ? ?  ?   ?PSH Notes: pace maker, heart valve   ? ?NON-GU PSH: Surgical Pathology, Gross And Microscopic Examination For Prostate Needle - 08/01/2021 ? ?  ? ?GU PMH: Prostate Cancer - 11/16/2021, - 11/07/2021, - 09/20/2021 ?  ? ?NON-GU PMH: Cardiac murmur, unspecified ?Glaucoma ?Hypercholesterolemia ?Hypertension ?  ? ?FAMILY HISTORY: Alzheimer's Disease - Father ?Cancer - Mother ?Kidney Failure - Brother ?Prostate Cancer - Brother  ? ?SOCIAL HISTORY: Marital Status: Divorced ?Preferred Language: English ?Current Smoking Status: Patient has never smoked.  ? ?Tobacco Use Assessment Completed: Used Tobacco in last 30 days? ?Does not use smokeless tobacco. ?Has never drank.  ?Does not use drugs. ?Drinks 2 caffeinated drinks per day. ?  ? ?REVIEW OF SYSTEMS:    ?GU Review Male:   Patient reports frequent urination, hard to postpone urination, get up at night to urinate, and leakage of urine. Patient denies burning/ pain with urination, stream starts and stops, trouble starting your stream, have to strain to urinate , erection problems, and penile pain.  ?Gastrointestinal (Upper):   Patient denies nausea, vomiting, and indigestion/ heartburn.  ?Gastrointestinal (Lower):   Patient denies diarrhea and constipation.  ?Constitutional:   Patient denies fever, night sweats, weight loss, and fatigue.  ?Skin:  Patient denies skin rash/ lesion and itching.  ?Eyes:   Patient denies blurred vision and double vision.  ?Ears/ Nose/ Throat:   Patient denies sore throat and sinus problems.  ?Hematologic/Lymphatic:   Patient denies swollen glands and  easy bruising.  ?Cardiovascular:   Patient denies chest pains and leg swelling.  ?Respiratory:   Patient denies cough and shortness of breath.  ?Endocrine:   Patient denies excessive thirst.  ?Musculoskeletal:   Patient reports back pain. Patient denies joint pain.  ?Neurological:   Patient denies headaches and dizziness.  ?Psychologic:   Patient denies depression and anxiety.  ? ?VITAL SIGNS:    ?  01/09/2022 09:04 AM  ?BP 112/72 mmHg  ?Pulse 88 /min  ?Temperature 98.0 F / 36.6 C  ? ?MULTI-SYSTEM PHYSICAL EXAMINATION:    ?Constitutional: Well-nourished. No physical deformities. Normally developed. Good grooming. Brohter John with him today.   ?Neck: Neck symmetrical, not swollen. Normal tracheal position.  ?Respiratory: No labored breathing, no use of accessory muscles.   ?Cardiovascular: Normal temperature, normal extremity pulses, no swelling, no varicosities.  ?Skin: No paleness, no jaundice, no cyanosis. No lesion, no ulcer, no rash.  ?Neurologic / Psychiatric: Oriented to time, oriented to place, oriented to person. No depression, no anxiety, no agitation.  ?Gastrointestinal: No mass, no tenderness, no rigidity, non obese abdomen.  ?Musculoskeletal: Normal gait and station of head and neck.  ? ?  ?Complexity of Data:  ?Source Of History:  Patient, Family/Caregiver, Medical Record Summary  ?Lab Test Review:   PSA  ?Records Review:   Pathology Reports, Previous Doctor Records, Previous Hospital Records, Previous Patient Records  ?Urine Test Review:   Urinalysis  ?X-Ray Review: C.T. Abdomen/Pelvis: Reviewed Films. Reviewed Report.  ?Bone Scan: Reviewed Report.  ?  ? 05/10/21  ?PSA  ?Total PSA 11.90 ng/mL  ? ? 01/09/22  ?Urinalysis  ?Urine Appearance Clear   ?Urine Color Yellow   ?Urine Glucose Neg mg/dL  ?Urine Bilirubin Neg mg/dL  ?Urine Ketones Neg mg/dL  ?Urine Specific Gravity 1.025   ?Urine Blood 2+ ery/uL  ?Urine pH <=5.0   ?Urine Protein Trace mg/dL  ?Urine Urobilinogen 0.2 mg/dL  ?Urine Nitrites Neg    ?Urine Leukocyte Esterase Neg leu/uL  ?Urine WBC/hpf NS (Not Seen)   ?Urine RBC/hpf 3 - 10/hpf   ?Urine Epithelial Cells 0 - 5/hpf   ?Urine Bacteria NS (Not Seen)   ?Urine Mucous Not Present   ?Urine Yeast NS (Not Seen)   ?Urine Trichomonas Not Present   ?Urine Cystals NS (Not Seen)   ?Urine Casts NS (Not Seen)   ?Urine Sperm Not Present   ? ?PROCEDURES:    ? ?     Urinalysis w/Scope ?Dipstick Dipstick Cont'd Micro  ?Color: Yellow Bilirubin: Neg mg/dL WBC/hpf: NS (Not Seen)  ?Appearance: Clear Ketones: Neg mg/dL RBC/hpf: 3 - 10/hpf  ?Specific Gravity: 1.025 Blood: 2+ ery/uL Bacteria: NS (Not Seen)  ?pH: <=5.0 Protein: Trace mg/dL Cystals: NS (Not Seen)  ?Glucose: Neg mg/dL Urobilinogen: 0.2 mg/dL Casts: NS (Not Seen)  ?  Nitrites: Neg Trichomonas: Not Present  ?  Leukocyte Esterase: Neg leu/uL Mucous: Not Present  ?    Epithelial Cells: 0 - 5/hpf  ?    Yeast: NS (Not Seen)  ?    Sperm: Not Present  ? ? ?Notes: microscopic not concentrated ?  ? ?ASSESSMENT:  ?    ICD-10 Details  ?1 GU:   Prostate Cancer - C61 Chronic, Threat to Bodily Function  ?2 NON-GU:  Encounter for other preprocedural examination - Z01.818 Undiagnosed New Problem  ? ?PLAN:    ? ?      Orders ?Labs Urine Culture  ? ? ?      Schedule ?Return Visit/Planned Activity: Keep Scheduled Appointment - Follow up MD, Schedule Surgery  ? ? ?      Document ?Letter(s):  Created for Patient: Clinical Summary  ? ? ?     Notes:   All questions answered to the best of my ability regarding the upcoming procedure and expected postoperative course with understanding expressed by the patient. He did have some microscopic hematuria noted on today's UA but again not exhibiting any underlying signs/symptoms of cystitis, not exhibiting any pain/discomfort suggestive of obstructive uropathy. Prior imaging of the upper tracts was grossly benign. He will have cystoscopy at time of his upcoming procedure. Precautionary urine culture sent today to serve as a baseline. He  will proceed with previously scheduled fiducial marker placement, SpaceOAR on 5/9.  ? ?     Next Appointment:    ?  Next Appointment: 01/17/2022 10:30 AM  ?  Appointment Type: Surgery   ?  Location: [facility]

## 2022-01-18 ENCOUNTER — Telehealth: Payer: Self-pay | Admitting: *Deleted

## 2022-01-18 ENCOUNTER — Encounter (HOSPITAL_BASED_OUTPATIENT_CLINIC_OR_DEPARTMENT_OTHER): Payer: Self-pay | Admitting: Urology

## 2022-01-18 NOTE — Telephone Encounter (Signed)
xxxxx 

## 2022-01-18 NOTE — Telephone Encounter (Signed)
Called patient to remind of sim appt. for 01-19-22- arrival time- 12:45 pm @ [facility], informed patient to arrive with a full bladder and an empty bowel, spoke with patient and he is aware of this appt. and the instructions ?

## 2022-01-19 ENCOUNTER — Encounter: Payer: Self-pay | Admitting: Cardiovascular Disease

## 2022-01-19 ENCOUNTER — Ambulatory Visit
Admission: RE | Admit: 2022-01-19 | Discharge: 2022-01-19 | Disposition: A | Discharge status: Home / Self Care | Payer: Medicare Other | Source: Ambulatory Visit | Attending: Radiation Oncology | Admitting: Radiation Oncology

## 2022-01-19 ENCOUNTER — Other Ambulatory Visit: Payer: Self-pay

## 2022-01-19 DIAGNOSIS — Z510 Encounter for antineoplastic radiation therapy: Secondary | ICD-10-CM | POA: Diagnosis present

## 2022-01-19 DIAGNOSIS — C61 Malignant neoplasm of prostate: Secondary | ICD-10-CM | POA: Diagnosis present

## 2022-01-19 NOTE — Progress Notes (Signed)
TO BE COMPLETED BY RADIATION ONCOLOGIST OFFICE:  ?Patient Name: [name] [name]  ? ?Date of Birth: 03-Mar-1946  ? ?Radiation Oncologist: Dr. Tyler Pita  ? ?Site to be Treated: Prostate  ? ?Will x-rays >10 MV be used? No  ? ?Will the radiation be >10 cm from the device? Yes  ? ?Planned Treatment Start Date: 1 week  ?TO BE COMPLETED BY CARDIOLOGIST OFFICE:  ? ?Device Information:  Pacemaker '[x]'$      ICD '[]'$   ? ?Brand: Medtronic: [phone] Model #: Azure ? ?Serial Number: [id number]     Date of Placement: 05/23/2021 ? ?Site of Placement: Left Chest ? ?Remote Device Check--Frequency: 91 days   Last Check: 11/23/2021 ? ?Is the Patient Pacer Dependent?:  Yes '[]'$   No '[x]'$  ? ?Does cardiologist request Radiation Oncology to schedule device testing by vendor for the following:  ?Prior to the Initiation of Treatments?  Yes '[]'$  No '[x]'$  ?During Treatments?  Yes '[]'$  No '[x]'$  ?Post Radiation Treatments?  Yes '[]'$  No '[x]'$  ? ?Is device monitoring necessary by vendor/cardiologist team during treatments?  ?Yes '[]'$   No '[x]'$  ? ?Is cardiac monitoring by Radiation Oncology nursing necessary during treatments? ?Yes '[]'$   No '[x]'$  ? ?Do you recommend device be relocated prior to Radiation Treatment? ?Yes '[]'$   No '[x]'$  ? ?**PLEASE LIST ANY NOTES OR SPECIAL REQUESTS: ? ? ? ?  ? ?CARDIOLOGIST SIGNATURE:  Dr. Dani Gobble Croitoru ?Per Device Clinic Standing Orders, ?Wanda Plump  ?01/19/2022 ?2:24 PM ? ?**Please route completed form back to Radiation Oncology Nursing and "P [facility] RAD ONC ADMIN", OR send an update if there will be a delay in having form completed by expected start date.  ?**Call [phone] if you have any questions or do not get an in-basket response from a Radiation Oncology staff member  ? ? ? ? ? ? ? ? ? ? ? ?  ?

## 2022-01-19 NOTE — Progress Notes (Signed)
?  Radiation Oncology         (336) [phone] ?________________________________ ? ?Name: [name] [name] MRN: [id number]  ?Date: 01/19/2022  DOB: 1946-09-06 ? ?SIMULATION AND TREATMENT PLANNING NOTE ? ?  ICD-10-CM   ?1. Malignant neoplasm of prostate ([place])  C61   ?  ? ? ?DIAGNOSIS:  76 y.o. gentleman with Stage T2a adenocarcinoma of the prostate with Gleason score of 4+3, and PSA of 11.9. ? ?NARRATIVE:  The patient was brought to the [place].  Identity was confirmed.  All relevant records and images related to the planned course of therapy were reviewed.  The patient freely provided informed written consent to proceed with treatment after reviewing the details related to the planned course of therapy. The consent form was witnessed and verified by the simulation staff.  Then, the patient was set-up in a stable reproducible supine position for radiation therapy.  A vacuum lock pillow device was custom fabricated to position his legs in a reproducible immobilized position.  Then, supervised the performance of a urethrogram under sterile conditions to identify the prostatic apex.  CT images were obtained.  Surface markings were placed.  The CT images were loaded into the planning software.  Then the prostate target and avoidance structures including the rectum, bladder, bowel and hips were contoured.  Treatment planning then occurred.  The radiation prescription was entered and confirmed.  A total of one complex treatment devices was fabricated. I have requested : Intensity Modulated Radiotherapy (IMRT) is medically necessary for this case for the following reason:  Rectal sparing.  I have requested daily cone beam CT volumetric image gudiance to track gold fiducial posiitoning along with bladder and rectal filling, this is medically necessary to assure accurate positioning of high dose radiation. ? ?PLAN:  The patient will receive 70 Gy in 28 fractions. ? ?________________________________ ? ?Sheral Apley  Tammi Klippel, M.D.   ?

## 2022-01-26 DIAGNOSIS — Z510 Encounter for antineoplastic radiation therapy: Secondary | ICD-10-CM | POA: Diagnosis not present

## 2022-01-30 ENCOUNTER — Other Ambulatory Visit: Payer: Self-pay

## 2022-01-30 ENCOUNTER — Ambulatory Visit
Admission: RE | Admit: 2022-01-30 | Discharge: 2022-01-30 | Disposition: A | Discharge status: Home / Self Care | Payer: Medicare Other | Source: Ambulatory Visit | Attending: Radiation Oncology | Admitting: Radiation Oncology

## 2022-01-30 DIAGNOSIS — Z510 Encounter for antineoplastic radiation therapy: Secondary | ICD-10-CM | POA: Diagnosis not present

## 2022-01-30 LAB — RAD ONC ARIA SESSION SUMMARY
Course Elapsed Days: 0
Plan Fractions Treated to Date: 1
Plan Prescribed Dose Per Fraction: 2.5 Gy
Plan Total Fractions Prescribed: 28
Plan Total Prescribed Dose: 70 Gy
Reference Point Dosage Given to Date: 2.5 Gy
Reference Point Session Dosage Given: 2.5 Gy
Session Number: 1

## 2022-01-31 ENCOUNTER — Other Ambulatory Visit: Payer: Self-pay

## 2022-01-31 ENCOUNTER — Ambulatory Visit
Admission: RE | Admit: 2022-01-31 | Discharge: 2022-01-31 | Disposition: A | Discharge status: Home / Self Care | Payer: Medicare Other | Source: Ambulatory Visit | Attending: Radiation Oncology | Admitting: Radiation Oncology

## 2022-01-31 DIAGNOSIS — Z510 Encounter for antineoplastic radiation therapy: Secondary | ICD-10-CM | POA: Diagnosis not present

## 2022-01-31 LAB — RAD ONC ARIA SESSION SUMMARY
Course Elapsed Days: 1
Plan Fractions Treated to Date: 2
Plan Prescribed Dose Per Fraction: 2.5 Gy
Plan Total Fractions Prescribed: 28
Plan Total Prescribed Dose: 70 Gy
Reference Point Dosage Given to Date: 5 Gy
Reference Point Session Dosage Given: 2.5 Gy
Session Number: 2

## 2022-02-01 ENCOUNTER — Other Ambulatory Visit: Payer: Self-pay

## 2022-02-01 ENCOUNTER — Ambulatory Visit
Admission: RE | Admit: 2022-02-01 | Discharge: 2022-02-01 | Disposition: A | Discharge status: Home / Self Care | Payer: Medicare Other | Source: Ambulatory Visit | Attending: Radiation Oncology | Admitting: Radiation Oncology

## 2022-02-01 DIAGNOSIS — Z510 Encounter for antineoplastic radiation therapy: Secondary | ICD-10-CM | POA: Diagnosis not present

## 2022-02-01 LAB — RAD ONC ARIA SESSION SUMMARY
Course Elapsed Days: 2
Plan Fractions Treated to Date: 3
Plan Prescribed Dose Per Fraction: 2.5 Gy
Plan Total Fractions Prescribed: 28
Plan Total Prescribed Dose: 70 Gy
Reference Point Dosage Given to Date: 7.5 Gy
Reference Point Session Dosage Given: 2.5 Gy
Session Number: 3

## 2022-02-02 ENCOUNTER — Ambulatory Visit
Admission: RE | Admit: 2022-02-02 | Discharge: 2022-02-02 | Disposition: A | Discharge status: Home / Self Care | Payer: Medicare Other | Source: Ambulatory Visit | Attending: Radiation Oncology | Admitting: Radiation Oncology

## 2022-02-02 ENCOUNTER — Other Ambulatory Visit: Payer: Self-pay

## 2022-02-02 DIAGNOSIS — Z510 Encounter for antineoplastic radiation therapy: Secondary | ICD-10-CM | POA: Diagnosis not present

## 2022-02-02 LAB — RAD ONC ARIA SESSION SUMMARY
Course Elapsed Days: 3
Plan Fractions Treated to Date: 4
Plan Prescribed Dose Per Fraction: 2.5 Gy
Plan Total Fractions Prescribed: 28
Plan Total Prescribed Dose: 70 Gy
Reference Point Dosage Given to Date: 10 Gy
Reference Point Session Dosage Given: 2.5 Gy
Session Number: 4

## 2022-02-03 ENCOUNTER — Other Ambulatory Visit: Payer: Self-pay

## 2022-02-03 ENCOUNTER — Ambulatory Visit
Admission: RE | Admit: 2022-02-03 | Discharge: 2022-02-03 | Disposition: A | Discharge status: Home / Self Care | Payer: Medicare Other | Source: Ambulatory Visit | Attending: Radiation Oncology | Admitting: Radiation Oncology

## 2022-02-03 DIAGNOSIS — Z510 Encounter for antineoplastic radiation therapy: Secondary | ICD-10-CM | POA: Diagnosis not present

## 2022-02-03 LAB — RAD ONC ARIA SESSION SUMMARY
Course Elapsed Days: 4
Plan Fractions Treated to Date: 5
Plan Prescribed Dose Per Fraction: 2.5 Gy
Plan Total Fractions Prescribed: 28
Plan Total Prescribed Dose: 70 Gy
Reference Point Dosage Given to Date: 12.5 Gy
Reference Point Session Dosage Given: 2.5 Gy
Session Number: 5

## 2022-02-07 ENCOUNTER — Other Ambulatory Visit: Payer: Self-pay

## 2022-02-07 ENCOUNTER — Ambulatory Visit
Admission: RE | Admit: 2022-02-07 | Discharge: 2022-02-07 | Disposition: A | Discharge status: Home / Self Care | Payer: Medicare Other | Source: Ambulatory Visit | Attending: Radiation Oncology | Admitting: Radiation Oncology

## 2022-02-07 DIAGNOSIS — Z510 Encounter for antineoplastic radiation therapy: Secondary | ICD-10-CM | POA: Diagnosis not present

## 2022-02-07 LAB — RAD ONC ARIA SESSION SUMMARY
Course Elapsed Days: 8
Plan Fractions Treated to Date: 6
Plan Prescribed Dose Per Fraction: 2.5 Gy
Plan Total Fractions Prescribed: 28
Plan Total Prescribed Dose: 70 Gy
Reference Point Dosage Given to Date: 15 Gy
Reference Point Session Dosage Given: 2.5 Gy
Session Number: 6

## 2022-02-08 ENCOUNTER — Other Ambulatory Visit: Payer: Self-pay

## 2022-02-08 ENCOUNTER — Ambulatory Visit
Admission: RE | Admit: 2022-02-08 | Discharge: 2022-02-08 | Disposition: A | Discharge status: Home / Self Care | Payer: Medicare Other | Source: Ambulatory Visit | Attending: Radiation Oncology | Admitting: Radiation Oncology

## 2022-02-08 DIAGNOSIS — Z510 Encounter for antineoplastic radiation therapy: Secondary | ICD-10-CM | POA: Diagnosis not present

## 2022-02-08 LAB — RAD ONC ARIA SESSION SUMMARY
Course Elapsed Days: 9
Plan Fractions Treated to Date: 7
Plan Prescribed Dose Per Fraction: 2.5 Gy
Plan Total Fractions Prescribed: 28
Plan Total Prescribed Dose: 70 Gy
Reference Point Dosage Given to Date: 17.5 Gy
Reference Point Session Dosage Given: 2.5 Gy
Session Number: 7

## 2022-02-09 ENCOUNTER — Ambulatory Visit
Admission: RE | Admit: 2022-02-09 | Discharge: 2022-02-09 | Disposition: A | Discharge status: Home / Self Care | Payer: Medicare Other | Source: Ambulatory Visit | Attending: Radiation Oncology | Admitting: Radiation Oncology

## 2022-02-09 ENCOUNTER — Other Ambulatory Visit: Payer: Self-pay

## 2022-02-09 DIAGNOSIS — C61 Malignant neoplasm of prostate: Secondary | ICD-10-CM | POA: Diagnosis present

## 2022-02-09 DIAGNOSIS — Z510 Encounter for antineoplastic radiation therapy: Secondary | ICD-10-CM | POA: Diagnosis present

## 2022-02-09 LAB — RAD ONC ARIA SESSION SUMMARY
Course Elapsed Days: 10
Plan Fractions Treated to Date: 8
Plan Prescribed Dose Per Fraction: 2.5 Gy
Plan Total Fractions Prescribed: 28
Plan Total Prescribed Dose: 70 Gy
Reference Point Dosage Given to Date: 20 Gy
Reference Point Session Dosage Given: 2.5 Gy
Session Number: 8

## 2022-02-10 ENCOUNTER — Other Ambulatory Visit: Payer: Self-pay

## 2022-02-10 ENCOUNTER — Ambulatory Visit
Admission: RE | Admit: 2022-02-10 | Discharge: 2022-02-10 | Disposition: A | Discharge status: Home / Self Care | Payer: Medicare Other | Source: Ambulatory Visit | Attending: Radiation Oncology | Admitting: Radiation Oncology

## 2022-02-10 DIAGNOSIS — Z510 Encounter for antineoplastic radiation therapy: Secondary | ICD-10-CM | POA: Diagnosis not present

## 2022-02-10 LAB — RAD ONC ARIA SESSION SUMMARY
Course Elapsed Days: 11
Plan Fractions Treated to Date: 9
Plan Prescribed Dose Per Fraction: 2.5 Gy
Plan Total Fractions Prescribed: 28
Plan Total Prescribed Dose: 70 Gy
Reference Point Dosage Given to Date: 22.5 Gy
Reference Point Session Dosage Given: 2.5 Gy
Session Number: 9

## 2022-02-13 ENCOUNTER — Ambulatory Visit
Admission: RE | Admit: 2022-02-13 | Discharge: 2022-02-13 | Disposition: A | Discharge status: Home / Self Care | Payer: Medicare Other | Source: Ambulatory Visit | Attending: Radiation Oncology | Admitting: Radiation Oncology

## 2022-02-13 ENCOUNTER — Other Ambulatory Visit: Payer: Self-pay

## 2022-02-13 ENCOUNTER — Ambulatory Visit: Payer: Medicare Other

## 2022-02-13 DIAGNOSIS — Z510 Encounter for antineoplastic radiation therapy: Secondary | ICD-10-CM | POA: Diagnosis not present

## 2022-02-13 LAB — RAD ONC ARIA SESSION SUMMARY
Course Elapsed Days: 14
Plan Fractions Treated to Date: 10
Plan Prescribed Dose Per Fraction: 2.5 Gy
Plan Total Fractions Prescribed: 28
Plan Total Prescribed Dose: 70 Gy
Reference Point Dosage Given to Date: 25 Gy
Reference Point Session Dosage Given: 2.5 Gy
Session Number: 10

## 2022-02-14 ENCOUNTER — Ambulatory Visit
Admission: RE | Admit: 2022-02-14 | Discharge: 2022-02-14 | Disposition: A | Discharge status: Home / Self Care | Payer: Medicare Other | Source: Ambulatory Visit | Attending: Radiation Oncology | Admitting: Radiation Oncology

## 2022-02-14 ENCOUNTER — Other Ambulatory Visit: Payer: Self-pay

## 2022-02-14 DIAGNOSIS — Z510 Encounter for antineoplastic radiation therapy: Secondary | ICD-10-CM | POA: Diagnosis not present

## 2022-02-14 LAB — RAD ONC ARIA SESSION SUMMARY
Course Elapsed Days: 15
Plan Fractions Treated to Date: 11
Plan Prescribed Dose Per Fraction: 2.5 Gy
Plan Total Fractions Prescribed: 28
Plan Total Prescribed Dose: 70 Gy
Reference Point Dosage Given to Date: 27.5 Gy
Reference Point Session Dosage Given: 2.5 Gy
Session Number: 11

## 2022-02-15 ENCOUNTER — Ambulatory Visit: Payer: Medicare Other

## 2022-02-15 ENCOUNTER — Other Ambulatory Visit: Payer: Self-pay

## 2022-02-15 ENCOUNTER — Ambulatory Visit
Admission: RE | Admit: 2022-02-15 | Discharge: 2022-02-15 | Disposition: A | Discharge status: Home / Self Care | Payer: Medicare Other | Source: Ambulatory Visit | Attending: Radiation Oncology | Admitting: Radiation Oncology

## 2022-02-15 ENCOUNTER — Other Ambulatory Visit: Payer: Self-pay | Admitting: Cardiovascular Disease

## 2022-02-15 DIAGNOSIS — Z510 Encounter for antineoplastic radiation therapy: Secondary | ICD-10-CM | POA: Diagnosis not present

## 2022-02-15 LAB — RAD ONC ARIA SESSION SUMMARY
Course Elapsed Days: 16
Plan Fractions Treated to Date: 12
Plan Prescribed Dose Per Fraction: 2.5 Gy
Plan Total Fractions Prescribed: 28
Plan Total Prescribed Dose: 70 Gy
Reference Point Dosage Given to Date: 30 Gy
Reference Point Session Dosage Given: 2.5 Gy
Session Number: 12

## 2022-02-16 ENCOUNTER — Other Ambulatory Visit: Payer: Self-pay

## 2022-02-16 ENCOUNTER — Ambulatory Visit
Admission: RE | Admit: 2022-02-16 | Discharge: 2022-02-16 | Disposition: A | Discharge status: Home / Self Care | Payer: Medicare Other | Source: Ambulatory Visit | Attending: Radiation Oncology | Admitting: Radiation Oncology

## 2022-02-16 DIAGNOSIS — Z510 Encounter for antineoplastic radiation therapy: Secondary | ICD-10-CM | POA: Diagnosis not present

## 2022-02-16 LAB — RAD ONC ARIA SESSION SUMMARY
Course Elapsed Days: 17
Plan Fractions Treated to Date: 13
Plan Prescribed Dose Per Fraction: 2.5 Gy
Plan Total Fractions Prescribed: 28
Plan Total Prescribed Dose: 70 Gy
Reference Point Dosage Given to Date: 32.5 Gy
Reference Point Session Dosage Given: 2.5 Gy
Session Number: 13

## 2022-02-17 ENCOUNTER — Ambulatory Visit
Admission: RE | Admit: 2022-02-17 | Discharge: 2022-02-17 | Disposition: A | Discharge status: Home / Self Care | Payer: Medicare Other | Source: Ambulatory Visit | Attending: Radiation Oncology | Admitting: Radiation Oncology

## 2022-02-17 ENCOUNTER — Other Ambulatory Visit: Payer: Self-pay

## 2022-02-17 DIAGNOSIS — Z510 Encounter for antineoplastic radiation therapy: Secondary | ICD-10-CM | POA: Diagnosis not present

## 2022-02-17 LAB — RAD ONC ARIA SESSION SUMMARY
Course Elapsed Days: 18
Plan Fractions Treated to Date: 14
Plan Prescribed Dose Per Fraction: 2.5 Gy
Plan Total Fractions Prescribed: 28
Plan Total Prescribed Dose: 70 Gy
Reference Point Dosage Given to Date: 35 Gy
Reference Point Session Dosage Given: 2.5 Gy
Session Number: 14

## 2022-02-20 ENCOUNTER — Ambulatory Visit
Admission: RE | Admit: 2022-02-20 | Discharge: 2022-02-20 | Disposition: A | Discharge status: Home / Self Care | Payer: Medicare Other | Source: Ambulatory Visit | Attending: Radiation Oncology | Admitting: Radiation Oncology

## 2022-02-20 ENCOUNTER — Other Ambulatory Visit: Payer: Self-pay

## 2022-02-20 ENCOUNTER — Ambulatory Visit (INDEPENDENT_AMBULATORY_CARE_PROVIDER_SITE_OTHER): Payer: Medicare Other

## 2022-02-20 DIAGNOSIS — I441 Atrioventricular block, second degree: Secondary | ICD-10-CM | POA: Diagnosis not present

## 2022-02-20 DIAGNOSIS — Z950 Presence of cardiac pacemaker: Secondary | ICD-10-CM | POA: Diagnosis not present

## 2022-02-20 DIAGNOSIS — Z510 Encounter for antineoplastic radiation therapy: Secondary | ICD-10-CM | POA: Diagnosis not present

## 2022-02-20 LAB — RAD ONC ARIA SESSION SUMMARY
Course Elapsed Days: 21
Plan Fractions Treated to Date: 15
Plan Prescribed Dose Per Fraction: 2.5 Gy
Plan Total Fractions Prescribed: 28
Plan Total Prescribed Dose: 70 Gy
Reference Point Dosage Given to Date: 37.5 Gy
Reference Point Session Dosage Given: 2.5 Gy
Session Number: 15

## 2022-02-21 ENCOUNTER — Ambulatory Visit
Admission: RE | Admit: 2022-02-21 | Discharge: 2022-02-21 | Disposition: A | Discharge status: Home / Self Care | Payer: Medicare Other | Source: Ambulatory Visit | Attending: Radiation Oncology | Admitting: Radiation Oncology

## 2022-02-21 ENCOUNTER — Other Ambulatory Visit: Payer: Self-pay

## 2022-02-21 DIAGNOSIS — Z510 Encounter for antineoplastic radiation therapy: Secondary | ICD-10-CM | POA: Diagnosis not present

## 2022-02-21 LAB — RAD ONC ARIA SESSION SUMMARY
Course Elapsed Days: 22
Plan Fractions Treated to Date: 16
Plan Prescribed Dose Per Fraction: 2.5 Gy
Plan Total Fractions Prescribed: 28
Plan Total Prescribed Dose: 70 Gy
Reference Point Dosage Given to Date: 40 Gy
Reference Point Session Dosage Given: 2.5 Gy
Session Number: 16

## 2022-02-22 ENCOUNTER — Ambulatory Visit
Admission: RE | Admit: 2022-02-22 | Discharge: 2022-02-22 | Disposition: A | Discharge status: Home / Self Care | Payer: Medicare Other | Source: Ambulatory Visit | Attending: Radiation Oncology | Admitting: Radiation Oncology

## 2022-02-22 ENCOUNTER — Other Ambulatory Visit: Payer: Self-pay

## 2022-02-22 DIAGNOSIS — Z510 Encounter for antineoplastic radiation therapy: Secondary | ICD-10-CM | POA: Diagnosis not present

## 2022-02-22 LAB — CUP PACEART REMOTE DEVICE CHECK
Battery Remaining Longevity: 162 mo
Battery Voltage: 3.14 V
Brady Statistic AP VP Percent: 0.19 %
Brady Statistic AP VS Percent: 52.19 %
Brady Statistic AS VP Percent: 0.09 %
Brady Statistic AS VS Percent: 47.53 %
Brady Statistic RA Percent Paced: 53.01 %
Brady Statistic RV Percent Paced: 0.28 %
Date Time Interrogation Session: 20230614070525
Implantable Lead Implant Date: 20111110
Implantable Lead Implant Date: 20111110
Implantable Lead Location: 753859
Implantable Lead Location: 753860
Implantable Pulse Generator Implant Date: 20220912
Lead Channel Impedance Value: 304 Ohm
Lead Channel Impedance Value: 380 Ohm
Lead Channel Impedance Value: 513 Ohm
Lead Channel Impedance Value: 570 Ohm
Lead Channel Pacing Threshold Amplitude: 0.875 V
Lead Channel Pacing Threshold Amplitude: 1.625 V
Lead Channel Pacing Threshold Pulse Width: 0.4 ms
Lead Channel Pacing Threshold Pulse Width: 0.4 ms
Lead Channel Sensing Intrinsic Amplitude: 1.5 mV
Lead Channel Sensing Intrinsic Amplitude: 1.5 mV
Lead Channel Sensing Intrinsic Amplitude: 2.25 mV
Lead Channel Sensing Intrinsic Amplitude: 2.25 mV
Lead Channel Setting Pacing Amplitude: 1.75 V
Lead Channel Setting Pacing Amplitude: 3.25 V
Lead Channel Setting Pacing Pulse Width: 0.4 ms
Lead Channel Setting Sensing Sensitivity: 1.2 mV

## 2022-02-22 LAB — RAD ONC ARIA SESSION SUMMARY
Course Elapsed Days: 23
Plan Fractions Treated to Date: 17
Plan Prescribed Dose Per Fraction: 2.5 Gy
Plan Total Fractions Prescribed: 28
Plan Total Prescribed Dose: 70 Gy
Reference Point Dosage Given to Date: 42.5 Gy
Reference Point Session Dosage Given: 2.5 Gy
Session Number: 17

## 2022-02-23 ENCOUNTER — Ambulatory Visit
Admission: RE | Admit: 2022-02-23 | Discharge: 2022-02-23 | Disposition: A | Discharge status: Home / Self Care | Payer: Medicare Other | Source: Ambulatory Visit | Attending: Radiation Oncology | Admitting: Radiation Oncology

## 2022-02-23 ENCOUNTER — Other Ambulatory Visit: Payer: Self-pay

## 2022-02-23 DIAGNOSIS — Z510 Encounter for antineoplastic radiation therapy: Secondary | ICD-10-CM | POA: Diagnosis not present

## 2022-02-23 LAB — RAD ONC ARIA SESSION SUMMARY
Course Elapsed Days: 24
Plan Fractions Treated to Date: 18
Plan Prescribed Dose Per Fraction: 2.5 Gy
Plan Total Fractions Prescribed: 28
Plan Total Prescribed Dose: 70 Gy
Reference Point Dosage Given to Date: 45 Gy
Reference Point Session Dosage Given: 2.5 Gy
Session Number: 18

## 2022-02-24 ENCOUNTER — Ambulatory Visit
Admission: RE | Admit: 2022-02-24 | Discharge: 2022-02-24 | Disposition: A | Discharge status: Home / Self Care | Payer: Medicare Other | Source: Ambulatory Visit | Attending: Radiation Oncology | Admitting: Radiation Oncology

## 2022-02-24 ENCOUNTER — Other Ambulatory Visit: Payer: Self-pay

## 2022-02-24 DIAGNOSIS — Z510 Encounter for antineoplastic radiation therapy: Secondary | ICD-10-CM | POA: Diagnosis not present

## 2022-02-24 LAB — RAD ONC ARIA SESSION SUMMARY
Course Elapsed Days: 25
Plan Fractions Treated to Date: 19
Plan Prescribed Dose Per Fraction: 2.5 Gy
Plan Total Fractions Prescribed: 28
Plan Total Prescribed Dose: 70 Gy
Reference Point Dosage Given to Date: 47.5 Gy
Reference Point Session Dosage Given: 2.5 Gy
Session Number: 19

## 2022-02-27 ENCOUNTER — Other Ambulatory Visit: Payer: Self-pay

## 2022-02-27 ENCOUNTER — Ambulatory Visit
Admission: RE | Admit: 2022-02-27 | Discharge: 2022-02-27 | Disposition: A | Discharge status: Home / Self Care | Payer: Medicare Other | Source: Ambulatory Visit | Attending: Radiation Oncology | Admitting: Radiation Oncology

## 2022-02-27 DIAGNOSIS — Z510 Encounter for antineoplastic radiation therapy: Secondary | ICD-10-CM | POA: Diagnosis not present

## 2022-02-27 LAB — RAD ONC ARIA SESSION SUMMARY
Course Elapsed Days: 28
Plan Fractions Treated to Date: 20
Plan Prescribed Dose Per Fraction: 2.5 Gy
Plan Total Fractions Prescribed: 28
Plan Total Prescribed Dose: 70 Gy
Reference Point Dosage Given to Date: 50 Gy
Reference Point Session Dosage Given: 2.5 Gy
Session Number: 20

## 2022-02-28 ENCOUNTER — Other Ambulatory Visit: Payer: Self-pay

## 2022-02-28 ENCOUNTER — Ambulatory Visit
Admission: RE | Admit: 2022-02-28 | Discharge: 2022-02-28 | Disposition: A | Discharge status: Home / Self Care | Payer: Medicare Other | Source: Ambulatory Visit | Attending: Radiation Oncology | Admitting: Radiation Oncology

## 2022-02-28 DIAGNOSIS — Z510 Encounter for antineoplastic radiation therapy: Secondary | ICD-10-CM | POA: Diagnosis not present

## 2022-02-28 LAB — RAD ONC ARIA SESSION SUMMARY
Course Elapsed Days: 29
Plan Fractions Treated to Date: 21
Plan Prescribed Dose Per Fraction: 2.5 Gy
Plan Total Fractions Prescribed: 28
Plan Total Prescribed Dose: 70 Gy
Reference Point Dosage Given to Date: 52.5 Gy
Reference Point Session Dosage Given: 2.5 Gy
Session Number: 21

## 2022-03-01 ENCOUNTER — Other Ambulatory Visit: Payer: Self-pay

## 2022-03-01 ENCOUNTER — Ambulatory Visit
Admission: RE | Admit: 2022-03-01 | Discharge: 2022-03-01 | Disposition: A | Discharge status: Home / Self Care | Payer: Medicare Other | Source: Ambulatory Visit | Attending: Radiation Oncology | Admitting: Radiation Oncology

## 2022-03-01 DIAGNOSIS — Z510 Encounter for antineoplastic radiation therapy: Secondary | ICD-10-CM | POA: Diagnosis not present

## 2022-03-01 LAB — RAD ONC ARIA SESSION SUMMARY
Course Elapsed Days: 30
Plan Fractions Treated to Date: 22
Plan Prescribed Dose Per Fraction: 2.5 Gy
Plan Total Fractions Prescribed: 28
Plan Total Prescribed Dose: 70 Gy
Reference Point Dosage Given to Date: 55 Gy
Reference Point Session Dosage Given: 2.5 Gy
Session Number: 22

## 2022-03-02 ENCOUNTER — Other Ambulatory Visit: Payer: Self-pay

## 2022-03-02 ENCOUNTER — Ambulatory Visit
Admission: RE | Admit: 2022-03-02 | Discharge: 2022-03-02 | Disposition: A | Discharge status: Home / Self Care | Payer: Medicare Other | Source: Ambulatory Visit | Attending: Radiation Oncology | Admitting: Radiation Oncology

## 2022-03-02 DIAGNOSIS — Z510 Encounter for antineoplastic radiation therapy: Secondary | ICD-10-CM | POA: Diagnosis not present

## 2022-03-02 LAB — RAD ONC ARIA SESSION SUMMARY
Course Elapsed Days: 31
Plan Fractions Treated to Date: 23
Plan Prescribed Dose Per Fraction: 2.5 Gy
Plan Total Fractions Prescribed: 28
Plan Total Prescribed Dose: 70 Gy
Reference Point Dosage Given to Date: 57.5 Gy
Reference Point Session Dosage Given: 2.5 Gy
Session Number: 23

## 2022-03-03 ENCOUNTER — Other Ambulatory Visit: Payer: Self-pay

## 2022-03-03 ENCOUNTER — Ambulatory Visit
Admission: RE | Admit: 2022-03-03 | Discharge: 2022-03-03 | Disposition: A | Discharge status: Home / Self Care | Payer: Medicare Other | Source: Ambulatory Visit | Attending: Radiation Oncology | Admitting: Radiation Oncology

## 2022-03-03 DIAGNOSIS — Z510 Encounter for antineoplastic radiation therapy: Secondary | ICD-10-CM | POA: Diagnosis not present

## 2022-03-03 LAB — RAD ONC ARIA SESSION SUMMARY
Course Elapsed Days: 32
Plan Fractions Treated to Date: 24
Plan Prescribed Dose Per Fraction: 2.5 Gy
Plan Total Fractions Prescribed: 28
Plan Total Prescribed Dose: 70 Gy
Reference Point Dosage Given to Date: 60 Gy
Reference Point Session Dosage Given: 2.5 Gy
Session Number: 24

## 2022-03-06 ENCOUNTER — Other Ambulatory Visit: Payer: Self-pay

## 2022-03-06 ENCOUNTER — Ambulatory Visit
Admission: RE | Admit: 2022-03-06 | Discharge: 2022-03-06 | Disposition: A | Discharge status: Home / Self Care | Payer: Medicare Other | Source: Ambulatory Visit | Attending: Radiation Oncology | Admitting: Radiation Oncology

## 2022-03-06 DIAGNOSIS — Z510 Encounter for antineoplastic radiation therapy: Secondary | ICD-10-CM | POA: Diagnosis not present

## 2022-03-06 LAB — RAD ONC ARIA SESSION SUMMARY
Course Elapsed Days: 35
Plan Fractions Treated to Date: 25
Plan Prescribed Dose Per Fraction: 2.5 Gy
Plan Total Fractions Prescribed: 28
Plan Total Prescribed Dose: 70 Gy
Reference Point Dosage Given to Date: 62.5 Gy
Reference Point Session Dosage Given: 2.5 Gy
Session Number: 25

## 2022-03-07 ENCOUNTER — Ambulatory Visit
Admission: RE | Admit: 2022-03-07 | Discharge: 2022-03-07 | Disposition: A | Discharge status: Home / Self Care | Payer: Medicare Other | Source: Ambulatory Visit | Attending: Radiation Oncology | Admitting: Radiation Oncology

## 2022-03-07 ENCOUNTER — Other Ambulatory Visit: Payer: Self-pay

## 2022-03-07 DIAGNOSIS — Z510 Encounter for antineoplastic radiation therapy: Secondary | ICD-10-CM | POA: Diagnosis not present

## 2022-03-07 LAB — RAD ONC ARIA SESSION SUMMARY
Course Elapsed Days: 36
Plan Fractions Treated to Date: 26
Plan Prescribed Dose Per Fraction: 2.5 Gy
Plan Total Fractions Prescribed: 28
Plan Total Prescribed Dose: 70 Gy
Reference Point Dosage Given to Date: 65 Gy
Reference Point Session Dosage Given: 2.5 Gy
Session Number: 26

## 2022-03-08 ENCOUNTER — Other Ambulatory Visit: Payer: Self-pay

## 2022-03-08 ENCOUNTER — Ambulatory Visit
Admission: RE | Admit: 2022-03-08 | Discharge: 2022-03-08 | Disposition: A | Discharge status: Home / Self Care | Payer: Medicare Other | Source: Ambulatory Visit | Attending: Radiation Oncology | Admitting: Radiation Oncology

## 2022-03-08 DIAGNOSIS — Z510 Encounter for antineoplastic radiation therapy: Secondary | ICD-10-CM | POA: Diagnosis not present

## 2022-03-08 LAB — RAD ONC ARIA SESSION SUMMARY
Course Elapsed Days: 37
Plan Fractions Treated to Date: 27
Plan Prescribed Dose Per Fraction: 2.5 Gy
Plan Total Fractions Prescribed: 28
Plan Total Prescribed Dose: 70 Gy
Reference Point Dosage Given to Date: 67.5 Gy
Reference Point Session Dosage Given: 2.5 Gy
Session Number: 27

## 2022-03-08 NOTE — Progress Notes (Signed)
Remote pacemaker transmission.   

## 2022-03-09 ENCOUNTER — Other Ambulatory Visit: Payer: Self-pay

## 2022-03-09 ENCOUNTER — Ambulatory Visit: Payer: Medicare Other

## 2022-03-09 ENCOUNTER — Ambulatory Visit
Admission: RE | Admit: 2022-03-09 | Discharge: 2022-03-09 | Disposition: A | Discharge status: Home / Self Care | Payer: Medicare Other | Source: Ambulatory Visit | Attending: Radiation Oncology | Admitting: Radiation Oncology

## 2022-03-09 ENCOUNTER — Encounter: Payer: Self-pay | Admitting: Urology

## 2022-03-09 DIAGNOSIS — Z510 Encounter for antineoplastic radiation therapy: Secondary | ICD-10-CM | POA: Diagnosis not present

## 2022-03-09 DIAGNOSIS — C61 Malignant neoplasm of prostate: Secondary | ICD-10-CM

## 2022-03-09 LAB — RAD ONC ARIA SESSION SUMMARY
Course Elapsed Days: 38
Plan Fractions Treated to Date: 28
Plan Prescribed Dose Per Fraction: 2.5 Gy
Plan Total Fractions Prescribed: 28
Plan Total Prescribed Dose: 70 Gy
Reference Point Dosage Given to Date: 70 Gy
Reference Point Session Dosage Given: 2.5 Gy
Session Number: 28

## 2022-04-17 ENCOUNTER — Encounter: Payer: Self-pay | Admitting: Urology

## 2022-04-17 NOTE — Progress Notes (Signed)
Telephone appointment. I verified patient's identity and began nursing interview. Patient reports urinary urgency. No other issues reported at this time.  Meaningful use complete. I-PSS score of 8-moderate. No urinary management medications. Urology appt- Aug, 2023  Reminded patient of his 9:30am-04/20/22 telephone appointment w/ Ashlyn Bruning PA-C. I left my extension [phone] in case patient needs anything. Patient verbalized understanding.  Patient contact [phone]

## 2022-04-20 ENCOUNTER — Ambulatory Visit
Admission: RE | Admit: 2022-04-20 | Discharge: 2022-04-20 | Disposition: A | Discharge status: Home / Self Care | Payer: Medicare Other | Source: Ambulatory Visit | Attending: Radiation Oncology | Admitting: Radiation Oncology

## 2022-04-20 DIAGNOSIS — C61 Malignant neoplasm of prostate: Secondary | ICD-10-CM | POA: Insufficient documentation

## 2022-04-20 NOTE — Progress Notes (Signed)
  Radiation Oncology         (336) [phone] ________________________________  Name: [name] [name] MRN: [id number]  Date: 03/09/2022  DOB: 01-23-1946  End of Treatment Note  Diagnosis:   76 y.o. gentleman with Stage T2a adenocarcinoma of the prostate with Gleason score of 4+3, and PSA of 11.9.     Indication for treatment:  Curative, Definitive Radiotherapy       Radiation treatment dates:   01/30/22 - 03/09/22  Site/dose:   The prostate was treated to 70 Gy in 28 fractions of 2.5 Gy  Beams/energy:   The patient was treated with IMRT using volumetric arc therapy delivering 6 MV X-rays to clockwise and counterclockwise circumferential arcs with a 90 degree collimator offset to avoid dose scalloping.  Image guidance was performed with daily cone beam CT prior to each fraction to align to gold markers in the prostate and assure proper bladder and rectal fill volumes.  Immobilization was achieved with BodyFix custom mold.  Narrative: The patient tolerated radiation treatment relatively well with only minor urinary irritation and modest fatigue.  He reported occasional hematuria and nocturia 3-4 times per night but denied dysuria, straining to void or incontinence.  He did not experience any bowel issues.  Plan: The patient has completed radiation treatment. He will return to radiation oncology clinic for routine followup in one month. I advised him to call or return sooner if he has any questions or concerns related to his recovery or treatment. ________________________________  Sheral Apley. Tammi Klippel, M.D.

## 2022-04-20 NOTE — Progress Notes (Signed)
Radiation Oncology         (336) [phone] ________________________________  Name: [name] [name] MRN: [id number]  Date: 04/20/2022  DOB: [birth year]  Post Treatment Note  CC: Pcp, No  Alexis Frock, MD  Diagnosis:   76 y.o. gentleman with Stage T2a adenocarcinoma of the prostate with Gleason score of 4+3, and PSA of 11.9.  Interval Since Last Radiation:  6 weeks (concurrent with ST-ADT)   01/30/22 - 03/09/22: The prostate was treated to 70 Gy in 28 fractions of 2.5 Gy  Narrative:  I spoke with the patient to conduct his routine scheduled 1 month follow up visit via telephone to spare the patient unnecessary potential exposure in the healthcare setting during the current COVID-19 pandemic.  The patient was notified in advance and gave permission to proceed with this visit format.  He tolerated radiation treatment relatively well with only minor urinary irritation and modest fatigue.  He reported occasional hematuria and nocturia 3-4 times per night but denied dysuria, straining to void or incontinence.  He did not experience any bowel issues.                              On review of systems, the patient states that he is doing very well in general.  His LUTS have completely resolved and he is back to his baseline at this point.  He specifically denies dysuria, gross hematuria, excessive daytime frequency, urgency, straining to void, incomplete bladder emptying or incontinence.  He reports a healthy appetite and is maintaining his weight.  He denies abdominal pain, nausea, vomiting, diarrhea or constipation.  His energy level has also returned to baseline and he has been able to remain active.  Overall, he is quite pleased with his progress to date.  ALLERGIES:  has No Known Allergies.  Meds: Current Outpatient Medications  Medication Sig Dispense Refill   amLODipine (NORVASC) 5 MG tablet Take 1 tablet (5 mg total) by mouth daily. (Patient taking differently: Take 5 mg by mouth daily.) 90  tablet 3   ELIQUIS 5 MG TABS tablet TAKE 1 TABLET TWICE A DAY 180 tablet 3   glipiZIDE (GLUCOTROL) 5 MG tablet Take 2.5 mg by mouth 2 (two) times daily before a meal.     irbesartan (AVAPRO) 300 MG tablet Take 300 mg by mouth daily.     metFORMIN (GLUCOPHAGE) 500 MG tablet Take 500 mg by mouth 2 (two) times daily with a meal.     metoprolol tartrate (LOPRESSOR) 50 MG tablet Take 1 tablet (50 mg total) by mouth 2 (two) times daily. 180 tablet 3   Multiple Vitamin (MULTIVITAMIN) capsule Take 1 capsule by mouth every 30 (thirty) days. Per pt vitamin packet     No current facility-administered medications for this encounter.    Physical Findings:  vitals were not taken for this visit.  Pain Assessment Pain Score: 0-No pain/10 Unable to assess due to telephone follow-up visit format.  Lab Findings: Lab Results  Component Value Date   WBC 7.4 05/02/2021   HGB 15.6 01/17/2022   HCT 46.0 01/17/2022   MCV 89 05/02/2021   PLT 195 05/02/2021     Radiographic Findings: No results found.  Impression/Plan: 1. 76 y.o. gentleman with Stage T2a adenocarcinoma of the prostate with Gleason score of 4+3, and PSA of 11.9. He will continue to follow up with urology for ongoing PSA determinations and had a follow up visit with Dr. Tresa Moore on 03/28/22.  His PSA was 0.032 which he is pleased with. He understands what to expect with regards to PSA monitoring going forward and is scheduled for his next follow up with labs in 6 months. I will look forward to following his response to treatment via correspondence with urology, and would be happy to continue to participate in his care if clinically indicated. I talked to the patient about what to expect in the future, including his risk for erectile dysfunction and rectal bleeding. I encouraged him to call or return to the office if he has any questions regarding his previous radiation or possible radiation side effects. He was comfortable with this plan and will  follow up as needed.     Nicholos Johns, PA-C

## 2022-05-22 ENCOUNTER — Ambulatory Visit (INDEPENDENT_AMBULATORY_CARE_PROVIDER_SITE_OTHER): Payer: Medicare Other

## 2022-05-22 DIAGNOSIS — I441 Atrioventricular block, second degree: Secondary | ICD-10-CM

## 2022-05-24 LAB — CUP PACEART REMOTE DEVICE CHECK
Battery Remaining Longevity: 158 mo
Battery Voltage: 3.09 V
Brady Statistic AP VP Percent: 0.36 %
Brady Statistic AP VS Percent: 57.33 %
Brady Statistic AS VP Percent: 0.13 %
Brady Statistic AS VS Percent: 42.18 %
Brady Statistic RA Percent Paced: 57.94 %
Brady Statistic RV Percent Paced: 0.49 %
Date Time Interrogation Session: 20230912123134
Implantable Lead Implant Date: 20111110
Implantable Lead Implant Date: 20111110
Implantable Lead Location: 753859
Implantable Lead Location: 753860
Implantable Pulse Generator Implant Date: 20220912
Lead Channel Impedance Value: 323 Ohm
Lead Channel Impedance Value: 380 Ohm
Lead Channel Impedance Value: 494 Ohm
Lead Channel Impedance Value: 570 Ohm
Lead Channel Pacing Threshold Amplitude: 0.75 V
Lead Channel Pacing Threshold Amplitude: 1.5 V
Lead Channel Pacing Threshold Pulse Width: 0.4 ms
Lead Channel Pacing Threshold Pulse Width: 0.4 ms
Lead Channel Sensing Intrinsic Amplitude: 1.5 mV
Lead Channel Sensing Intrinsic Amplitude: 1.5 mV
Lead Channel Sensing Intrinsic Amplitude: 2.875 mV
Lead Channel Sensing Intrinsic Amplitude: 2.875 mV
Lead Channel Setting Pacing Amplitude: 1.75 V
Lead Channel Setting Pacing Amplitude: 3 V
Lead Channel Setting Pacing Pulse Width: 0.4 ms
Lead Channel Setting Sensing Sensitivity: 1.2 mV

## 2022-05-30 ENCOUNTER — Encounter: Payer: Self-pay | Admitting: *Deleted

## 2022-06-08 NOTE — Progress Notes (Signed)
Remote pacemaker transmission.   

## 2022-06-15 ENCOUNTER — Encounter: Payer: Self-pay | Admitting: *Deleted

## 2022-06-15 NOTE — Progress Notes (Signed)
Survivorship Treatment Summary completed for appointment.

## 2022-06-19 ENCOUNTER — Telehealth: Payer: Self-pay | Admitting: Cardiovascular Disease

## 2022-06-19 ENCOUNTER — Inpatient Hospital Stay: Payer: Medicare Other | Attending: Internal Medicine | Admitting: *Deleted

## 2022-06-19 ENCOUNTER — Other Ambulatory Visit: Payer: Self-pay

## 2022-06-19 ENCOUNTER — Encounter: Payer: Self-pay | Admitting: *Deleted

## 2022-06-19 VITALS — BP 141/63 | HR 81 | Temp 97.8°F | Resp 18 | Ht 71.0 in | Wt 210.7 lb

## 2022-06-19 DIAGNOSIS — C61 Malignant neoplasm of prostate: Secondary | ICD-10-CM

## 2022-06-19 NOTE — Telephone Encounter (Signed)
Patient calling the office for samples of medication:   1.  What medication and dosage are you requesting samples for? Eliquis  2.  Are you currently out of this medication? Need 6 pills, until his mail order refill comes

## 2022-06-19 NOTE — Telephone Encounter (Addendum)
Prescription refill request for Eliquis received. Indication: Afib  Last office visit:11/14/21 (Croitoru) Scr: 0.84(06/14/22)  Age: 76 Weight: 95.6kg  Appropriate dose.  Samples left at front deck at [facility] office. Lot#: [id number] Exp: 05/25   Pt made aware.

## 2022-06-19 NOTE — Progress Notes (Signed)
   Pt presents today for Survivorship Clinic appt. Vitals signs are  WNL. Pt denies pian today. Allergies and medications reviewed and updated. Pt has recently obtained a new PCP at [facility] in [place], [place]. Last seen in August. Last colonoscopy was in 2021. Most recent A1C was 6.0 in August. Pt says, he is doing much better with urinary frequency and nocturia, only getting up at nighttime to bathroom about twice. Nurse reviewed "Nutrition Rainbow" and exercise information with patient. Pt denies smoking and drinking. SCP reviewed and completed.

## 2022-08-21 ENCOUNTER — Ambulatory Visit (INDEPENDENT_AMBULATORY_CARE_PROVIDER_SITE_OTHER): Payer: Medicare Other

## 2022-08-21 DIAGNOSIS — I441 Atrioventricular block, second degree: Secondary | ICD-10-CM | POA: Diagnosis not present

## 2022-08-22 LAB — CUP PACEART REMOTE DEVICE CHECK
Battery Remaining Longevity: 154 mo
Battery Voltage: 3.06 V
Brady Statistic AP VP Percent: 0.58 %
Brady Statistic AP VS Percent: 52.5 %
Brady Statistic AS VP Percent: 0.21 %
Brady Statistic AS VS Percent: 46.71 %
Brady Statistic RA Percent Paced: 53.19 %
Brady Statistic RV Percent Paced: 0.79 %
Date Time Interrogation Session: 20231212002735
Implantable Lead Connection Status: 753985
Implantable Lead Connection Status: 753985
Implantable Lead Implant Date: 20111110
Implantable Lead Implant Date: 20111110
Implantable Lead Location: 753859
Implantable Lead Location: 753860
Implantable Pulse Generator Implant Date: 20220912
Lead Channel Impedance Value: 323 Ohm
Lead Channel Impedance Value: 380 Ohm
Lead Channel Impedance Value: 494 Ohm
Lead Channel Impedance Value: 551 Ohm
Lead Channel Pacing Threshold Amplitude: 0.875 V
Lead Channel Pacing Threshold Amplitude: 1.25 V
Lead Channel Pacing Threshold Pulse Width: 0.4 ms
Lead Channel Pacing Threshold Pulse Width: 0.4 ms
Lead Channel Sensing Intrinsic Amplitude: 1.5 mV
Lead Channel Sensing Intrinsic Amplitude: 1.5 mV
Lead Channel Sensing Intrinsic Amplitude: 3 mV
Lead Channel Sensing Intrinsic Amplitude: 3 mV
Lead Channel Setting Pacing Amplitude: 1.75 V
Lead Channel Setting Pacing Amplitude: 2.5 V
Lead Channel Setting Pacing Pulse Width: 0.4 ms
Lead Channel Setting Sensing Sensitivity: 1.2 mV
Zone Setting Status: 755011

## 2022-09-28 NOTE — Progress Notes (Signed)
Remote pacemaker transmission.   

## 2022-11-20 ENCOUNTER — Ambulatory Visit (INDEPENDENT_AMBULATORY_CARE_PROVIDER_SITE_OTHER): Payer: Medicare Other

## 2022-11-20 DIAGNOSIS — I441 Atrioventricular block, second degree: Secondary | ICD-10-CM

## 2022-11-22 LAB — CUP PACEART REMOTE DEVICE CHECK
Battery Remaining Longevity: 151 mo
Battery Voltage: 3.04 V
Brady Statistic AP VP Percent: 0.47 %
Brady Statistic AP VS Percent: 40.74 %
Brady Statistic AS VP Percent: 0.37 %
Brady Statistic AS VS Percent: 58.42 %
Brady Statistic RA Percent Paced: 42.01 %
Brady Statistic RV Percent Paced: 0.84 %
Date Time Interrogation Session: 20240311101455
Implantable Lead Connection Status: 753985
Implantable Lead Connection Status: 753985
Implantable Lead Implant Date: 20111110
Implantable Lead Implant Date: 20111110
Implantable Lead Location: 753859
Implantable Lead Location: 753860
Implantable Pulse Generator Implant Date: 20220912
Lead Channel Impedance Value: 323 Ohm
Lead Channel Impedance Value: 380 Ohm
Lead Channel Impedance Value: 475 Ohm
Lead Channel Impedance Value: 570 Ohm
Lead Channel Pacing Threshold Amplitude: 0.875 V
Lead Channel Pacing Threshold Amplitude: 1.5 V
Lead Channel Pacing Threshold Pulse Width: 0.4 ms
Lead Channel Pacing Threshold Pulse Width: 0.4 ms
Lead Channel Sensing Intrinsic Amplitude: 1.375 mV
Lead Channel Sensing Intrinsic Amplitude: 1.375 mV
Lead Channel Sensing Intrinsic Amplitude: 2.625 mV
Lead Channel Sensing Intrinsic Amplitude: 2.625 mV
Lead Channel Setting Pacing Amplitude: 1.75 V
Lead Channel Setting Pacing Amplitude: 3 V
Lead Channel Setting Pacing Pulse Width: 0.4 ms
Lead Channel Setting Sensing Sensitivity: 1.2 mV
Zone Setting Status: 755011

## 2022-12-14 ENCOUNTER — Ambulatory Visit: Payer: Medicare Other | Attending: Cardiovascular Disease | Admitting: Cardiovascular Disease

## 2022-12-14 ENCOUNTER — Encounter: Payer: Medicare Other | Admitting: Cardiovascular Disease

## 2022-12-14 ENCOUNTER — Encounter: Payer: Self-pay | Admitting: Cardiovascular Disease

## 2022-12-14 VITALS — BP 141/66 | HR 84 | Ht 71.0 in | Wt 216.2 lb

## 2022-12-14 DIAGNOSIS — I351 Nonrheumatic aortic (valve) insufficiency: Secondary | ICD-10-CM | POA: Diagnosis present

## 2022-12-14 DIAGNOSIS — E785 Hyperlipidemia, unspecified: Secondary | ICD-10-CM | POA: Insufficient documentation

## 2022-12-14 DIAGNOSIS — D6869 Other thrombophilia: Secondary | ICD-10-CM | POA: Diagnosis present

## 2022-12-14 DIAGNOSIS — I480 Paroxysmal atrial fibrillation: Secondary | ICD-10-CM | POA: Insufficient documentation

## 2022-12-14 DIAGNOSIS — Z45010 Encounter for checking and testing of cardiac pacemaker pulse generator [battery]: Secondary | ICD-10-CM | POA: Insufficient documentation

## 2022-12-14 DIAGNOSIS — E119 Type 2 diabetes mellitus without complications: Secondary | ICD-10-CM | POA: Insufficient documentation

## 2022-12-14 DIAGNOSIS — Z952 Presence of prosthetic heart valve: Secondary | ICD-10-CM | POA: Diagnosis present

## 2022-12-14 DIAGNOSIS — I10 Essential (primary) hypertension: Secondary | ICD-10-CM | POA: Insufficient documentation

## 2022-12-14 DIAGNOSIS — I441 Atrioventricular block, second degree: Secondary | ICD-10-CM | POA: Insufficient documentation

## 2022-12-14 NOTE — Patient Instructions (Signed)
Medication Instructions:  No changes *If you need a refill on your cardiac medications before your next appointment, please call your pharmacy*  Testing/Procedures: Your physician has requested that you have an echocardiogram. Echocardiography is a painless test that uses sound waves to create images of your heart. It provides your doctor with information about the size and shape of your heart and how well your heart's chambers and valves are working. This procedure takes approximately one hour. There are no restrictions for this procedure. Please do NOT wear cologne, perfume, aftershave, or lotions (deodorant is allowed). Please arrive 15 minutes prior to your appointment time.    Follow-Up: At [facility], you and your health needs are our priority.  As part of our continuing mission to provide you with exceptional heart care, we have created designated Provider Care Teams.  These Care Teams include your primary Cardiologist (physician) and Advanced Practice Providers (APPs -  Physician Assistants and Nurse Practitioners) who all work together to provide you with the care you need, when you need it.  We recommend signing up for the patient portal called "MyChart".  Sign up information is provided on this After Visit Summary.  MyChart is used to connect with patients for Virtual Visits (Telemedicine).  Patients are able to view lab/test results, encounter notes, upcoming appointments, etc.  Non-urgent messages can be sent to your provider as well.   To learn more about what you can do with MyChart, go to [email or web address].    Your next appointment:   1 year(s)  Provider:   Sanda Klein, MD

## 2022-12-14 NOTE — Progress Notes (Signed)
Patient ID: [name] [name], male   DOB: 1946/01/07, 77 y.o.   MRN: [id number]     Cardiology Office Note    Date:  12/14/2022   ID:  [name] [name], DOB [birth year], MRN [id number]  PCP:  Joseph Art, MD  Cardiologist:   Sanda Klein, MD   Chief Complaint  Patient presents with   Cardiac Valve Problem   Atrial Fibrillation   Pacemaker Check    History of Present Illness:  [name] [name] is a 77 y.o. 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    [name] [name] has been doing quite well over the last year.  He has completed treatment for prostate cancer and is no longer on antiandrogen medications.  He has not had any cardiac issues.  He remains quite active (steady 4-hour/day according to his pacemaker).  The patient specifically denies any chest pain at rest exertion, dyspnea at rest or with exertion, orthopnea, paroxysmal nocturnal dyspnea, syncope, palpitations, focal neurological deficits, intermittent claudication, lower extremity edema, unexplained weight gain, cough, hemoptysis or wheezing.  He has not had any falls or bleeding problems.  Pacemaker interrogation shows normal device function with an estimated generator longevity of about 12 years.  All lead parameters are excellent.  He has 51% atrial pacing and only 0.6% ventricular pacing.  Ironically, presenting rhythm today was AV sequential pacing.  There is been a remarkable reduction in his burden of atrial fibrillation to only 24 seconds in the last 12 months, compared to a total of 10 hours in the preceding 6 months.  He has not had any episodes of high ventricular rate.  His blood pressure is borderline elevated today, but substantially lower at other recent office appointments.   He is established primary care follow-up at the [facility] and expects to receive 100% benefits due to history of agent orange exposure.  He also has a new primary care doctor close to home, Dr. Glenetta Hew.  He has had a full battery of labs at the [facility], but I do not have those available for review.  Most recent general labs from 10/09/2022 show a creatinine of 0.81, normal liver function test, normal electrolytes, borderline glucose 116, hemoglobin A1c 7.1%, cholesterol 150, triglycerides 93, HDL 43, direct LDL 96.  Hemoglobin 14.5 on 06/14/2022.  He underwent aortic valve replacement in January 2012 (21 mm bioprosthesis) for symptomatic aortic stenosis. Early on, his prosthesis showed evidence of mild to moderate aortic insufficiency that has not progressed over time. He has preserved left ventricular systolic function.  He did have postoperative atrial fibrillation.  In November 2011 he received a dual-chamber permanent pacemaker for syncope that was likely related to second degree atrioventricular block. He has an MRI conditional Medtronic Revo device (reached ERI in July 2022 and underwent generator change out with a Medtronic Azure XT device). 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   Past Medical History:  Diagnosis Date   Anticoagulant long-term use    eliquis--- managed by cardiology   Aortic valve insufficiency    cardiologist--- dr Sallyanne Kuster;   01/ 2012  s/p AVR for severe AS , bicusipd;  last echo 12-25-2019  mild AR no stenosis, mean grandiant 70mmHg   BPH associated with nocturia  Coronary artery disease    cardiologist--- dr Cayle Cordoba;   nuclear stress test 02-28-2007  normal perfusion no ischemia, nuclear ef 58%;   cardiac cath 06-30-2010  severe AS and nonobstructive disease involving LAD/ CFx/ RCA   Heart murmur    History of non-ST elevation myocardial infarction (NSTEMI) 06/2010   History of  ventricular tachycardia 06/2010   nonsustained VT in setting NSTEMI; severe AS   Hypertension    Malignant neoplasm prostate 07/2021   urologist--- dr gay:  dx 11/ 2022 Gleason 4+3, PSA 11.9   Mitral insufficiency    per echo 12-25-2019  mile regurg,  no stenosis, degenerative   Mixed dyslipidemia    Mobitz type 1 second degree AV block 06/2010   symptomatic syncope;  s/p PPM   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 patel   (01-13-2022  pt stated only checks sugar occasionally   Wears glasses     Past Surgical History:  Procedure Laterality Date   AORTIC VALVE REPLACEMENT  10/06/2010   @[facility]   by dr Burman [place] Ease prosthesis   CARDIAC CATHETERIZATION  06/30/2010   @[facility]   ;   nonobstructive CAD, heavily ca+ AOV   GOLD SEED IMPLANT N/A 01/17/2022   Procedure: GOLD SEED IMPLANT;  Surgeon: Janith Lima, MD;  Location: [facility];  Service: Urology;  Laterality: N/A;  [place]     yrs ago   PERMANENT PACEMAKER INSERTION  07/21/2010   @[facility]  by dr Helios Kohlmann;  Medtronic   PPM GENERATOR CHANGEOUT N/A 05/23/2021   Procedure: PPM GENERATOR CHANGEOUT;  Surgeon: Sanda Klein, MD;  Location: [facility];  Service: Cardiovascular;  Laterality: N/A;   SPACE OAR INSTILLATION N/A 01/17/2022   Procedure: SPACE OAR INSTILLATION;  Surgeon: Janith Lima, MD;  Location: [facility];  Service: Urology;  Laterality: N/A;    Outpatient Medications Prior to Visit  Medication Sig Dispense Refill    amLODipine (NORVASC) 5 MG tablet Take 1 tablet (5 mg total) by mouth daily. (Patient taking differently: Take 5 mg by mouth daily.) 90 tablet 3   atorvastatin (LIPITOR) 40 MG tablet Take 1 tablet by mouth daily.     ELIQUIS 5 MG TABS tablet TAKE 1 TABLET TWICE A DAY 180 tablet 3   glipiZIDE (GLUCOTROL) 5 MG tablet Take 2.5 mg by mouth 2 (two) times daily before a meal.     irbesartan (AVAPRO) 300 MG tablet Take 300 mg by mouth daily.     metFORMIN (GLUCOPHAGE) 500 MG tablet Take 0.5 tablets by mouth 2 (two) times daily.     metoprolol tartrate (LOPRESSOR) 50 MG tablet Take 1 tablet (50 mg total) by mouth 2 (two) times daily. 180 tablet 3   spironolactone (ALDACTONE) 25 MG tablet Take 12.5 mg by mouth once.     metFORMIN (GLUCOPHAGE) 500 MG tablet Take 500 mg by mouth 2 (two) times daily with a meal. (Patient not taking: Reported on 12/14/2022)  Multiple Vitamin (MULTIVITAMIN) capsule Take 1 capsule by mouth every 30 (thirty) days. Per pt vitamin packet (Patient not taking: Reported on 12/14/2022)     No facility-administered medications prior to visit.     Allergies:   Patient has no known allergies.   Social History   Socioeconomic History   Marital status: Single    Spouse name: Not on file   Number of children: Not on file   Years of education: Not on file   Highest education level: Not on file  Occupational History   Not on file  Tobacco Use   Smoking status: Former    Years: 11    Types: Cigarettes    Quit date: 6    Years since quitting: 29.2   Smokeless tobacco: Former    Types: Chew    Quit date: 1995  Vaping Use   Vaping Use: Never used  Substance and Sexual Activity   Alcohol use: No   Drug use: Never   Sexual activity: Not on file  Other Topics Concern   Not on file  Social History Narrative   Not on file   Social Determinants of Health   Financial Resource Strain: Not on file  Food Insecurity: Not on file  Transportation Needs: Not on file  Physical  Activity: Not on file  Stress: Not on file  Social Connections: Not on file     Family History:  The patient's brother recently underwent prostatectomy for prostate cancer  ROS:   Please see the history of present illness.    ROS All other systems are reviewed and are negative.   PHYSICAL EXAM:   VS:  BP (!) 142/68 (BP Location: Left Arm, Patient Position: Sitting, Cuff Size: Large)   Pulse 84   Ht 5\' 11"  (1.803 m)   Wt 216 lb 3.2 oz (98.1 kg)   SpO2 97%   BMI 30.15 kg/m      General: Alert, oriented x3, no distress, healthy left subclavian pacemaker site Head: no evidence of trauma, PERRL, EOMI, no exophtalmos or lid lag, no myxedema, no xanthelasma; normal ears, nose and oropharynx Neck: normal jugular venous pulsations and no hepatojugular reflux; brisk carotid pulses without delay and no carotid bruits Chest: clear to auscultation, no signs of consolidation by percussion or palpation, normal fremitus, symmetrical and full respiratory excursions Cardiovascular: normal position and quality of the apical impulse, regular rhythm, normal first and second heart sounds,  1/6 aortic regurgitation diastolic decrescendo murmur at the right lonwer sternal border, o systolic murmurs, rubs or gallops Abdomen: no tenderness or distention, no masses by palpation, no abnormal pulsatility or arterial bruits, normal bowel sounds, no hepatosplenomegaly Extremities: no clubbing, cyanosis or edema; 2+ radial, ulnar and brachial pulses bilaterally; 2+ right femoral, posterior tibial and dorsalis pedis pulses; 2+ left femoral, posterior tibial and dorsalis pedis pulses; no subclavian or femoral bruits Neurological: grossly nonfocal Psych: Normal mood and affect    Wt Readings from Last 3 Encounters:  12/14/22 216 lb 3.2 oz (98.1 kg)  06/19/22 210 lb 11.2 oz (95.6 kg)  01/17/22 197 lb 1.6 oz (89.4 kg)      Studies/Labs Reviewed:  Echocardiogram 12/25/2019  1. Left ventricular ejection fraction,  by estimation, is 60 to 65%. The left ventricle has normal function. Left ventricular endocardial border not optimally defined to evaluate regional wall motion. There is moderate asymmetric left ventricular hypertrophy of the basal-septal segment. Left ventricular diastolic parameters are consistent with Grade I diastolic dysfunction (impaired relaxation).  2. Right ventricular systolic function is mildly reduced. The right  ventricular size is mildly enlarged.   3. Left atrial size was moderately dilated.   4. The mitral valve is degenerative. Mild mitral valve regurgitation. No evidence of mitral stenosis.   5. The aortic valve has been repaired/replaced. Aortic valve  regurgitation is mild. There is a 21 mm Magna pericardial valve present in the aortic position. Procedure Date: 10/06/2010. Aortic valve mean gradient measures 11.0 mmHg.   6. Aortic dilatation noted. There is mild dilatation of the ascending aorta measuring 40 mm.   Comparison(s): A prior study was performed on 11/29/2016. No significant change from prior study. Prior images reviewed side by side.   EKG:  EKG is ordered today.  Personally reviewed, shows AV sequential pacing with a paced QRS duration 174 ms, QTc 553 ms.  Shows atrial paced, ventricular sensed rhythm with a very long AV delay of 348 ms and bifascicular block (RBBB plus LAFB), QTc 445 ms   Recent Labs: 08/09/2021 Creatinine 0.7, potassium 5.3   Lipid Panel    Component Value Date/Time   CHOL 142 12/27/2020 1151   TRIG 108 12/27/2020 1151   HDL 36 (L) 12/27/2020 1151   CHOLHDL 3.9 12/27/2020 1151   CHOLHDL 4.3 11/28/2016 0919   VLDL 23 11/28/2016 0919   LDLCALC 86 12/27/2020 1151   03 29 2021 Cholesterol 137, HDL 39, LDL 83, triglycerides 77 Hemoglobin A1c 5.9% Creatinine 0.86, potassium 4.8, normal liver function tests  12/27/2020 Cholesterol 142, HDL 36, LDL 86, triglycerides 108 Hemoglobin A1c 6.1% Creatinine 0.95, potassium 4.6, normal liver  function tests  10/09/2022  creatinine 0.81, normal liver function tests, normal electrolytes, glucose 116, hemoglobin A1c 7.1% cholesterol 150, triglycerides 93, HDL 43, direct LDL 96.   Hemoglobin 14.5 on 06/14/2022.  ASSESSMENT:    1. Paroxysmal atrial fibrillation   2. Second degree AV block, Mobitz type I   3. Acquired thrombophilia   4. Pacemaker battery depletion   5. History of aortic valve replacement   6. Nonrheumatic aortic valve insufficiency   7. Essential hypertension   8. Type 2 diabetes mellitus without complication, without long-term current use of insulin   9. Dyslipidemia (high LDL; low HDL)      PLAN:  In order of problems listed above:   PAF: Remarkable reduction in the burden of atrial fibrillation, essentially none in the last 12 months.  CHA2DS2-VASc 4 (age 50, hypertension, diabetes) he has no history of embolic events.  On anticoagulation. Second degree AV block: He has the equivalent of trifascicular block, but fortunately has very little ventricular pacing, less than 1%.  Ironically, he was in ventricular paced rhythm today. Pacemaker: Not dependent.  Normal device function.  Recent generator change out with battery at beginning of service.  Continue remote downloads every 3 months. Anticoagulation: No bleeding problems. S/P AVR with unexpected degree of aortic insufficiency, but this has been stable over time.  No evidence of CHF.  No progression in the severity of aortic insufficiency.  Time to recheck his echocardiogram.  He is aware of the need for antibiotic prophylaxis before invasive procedures. HTN: Borderline high systolic blood pressure today, usually lower at home.  Continue to monitor. DM: Control is not as good as in the past years, but still acceptable with hemoglobin A1c 7.1%.  Would be a good candidate for SGLT2 or GLP-1 agonist inhibitor instead of glipizide. HLP: LDL at target less than 100.  HDL borderline low.  Recommend weight  loss.  Medication Adjustments/Labs and Tests Ordered: Current medicines are reviewed at length with the patient today.  Concerns regarding medicines are outlined above.  Medication changes, Labs and Tests ordered today are listed in the Patient Instructions below. Patient Instructions  Medication Instructions:  No changes *If you need a refill on your cardiac medications before your next appointment, please call your pharmacy*  Testing/Procedures: Your physician has requested that you have an echocardiogram. Echocardiography is a painless test that uses sound waves to create images of your heart. It provides your doctor with information about the size and shape of your heart and how well your heart's chambers and valves are working. This procedure takes approximately one hour. There are no restrictions for this procedure. Please do NOT wear cologne, perfume, aftershave, or lotions (deodorant is allowed). Please arrive 15 minutes prior to your appointment time.    Follow-Up: At [facility], you and your health needs are our priority.  As part of our continuing mission to provide you with exceptional heart care, we have created designated Provider Care Teams.  These Care Teams include your primary Cardiologist (physician) and Advanced Practice Providers (APPs -  Physician Assistants and Nurse Practitioners) who all work together to provide you with the care you need, when you need it.  We recommend signing up for the patient portal called "MyChart".  Sign up information is provided on this After Visit Summary.  MyChart is used to connect with patients for Virtual Visits (Telemedicine).  Patients are able to view lab/test results, encounter notes, upcoming appointments, etc.  Non-urgent messages can be sent to your provider as well.   To learn more about what you can do with MyChart, go to [email or web address].    Your next appointment:   1 year(s)  Provider:   Sanda Klein, MD           Signed, Sanda Klein, MD  12/14/2022 9:29 AM    [facility] [place], [place], [place]  [address] Phone: [phone]; Fax: [phone]

## 2023-01-01 NOTE — Progress Notes (Signed)
Remote pacemaker transmission.   

## 2023-01-09 ENCOUNTER — Ambulatory Visit (HOSPITAL_COMMUNITY): Payer: Medicare Other | Attending: Internal Medicine

## 2023-01-09 DIAGNOSIS — I351 Nonrheumatic aortic (valve) insufficiency: Secondary | ICD-10-CM | POA: Insufficient documentation

## 2023-01-09 DIAGNOSIS — Z952 Presence of prosthetic heart valve: Secondary | ICD-10-CM | POA: Diagnosis present

## 2023-01-09 MED ORDER — PERFLUTREN LIPID MICROSPHERE
1.0000 mL | INTRAVENOUS | Status: AC | PRN
Start: 2023-01-09 — End: 2023-01-09
  Administered 2023-01-09: 2 mL via INTRAVENOUS

## 2023-01-10 LAB — ECHOCARDIOGRAM COMPLETE
AR max vel: 1.68 cm2
AV Area VTI: 1.54 cm2
AV Area mean vel: 1.84 cm2
AV Mean grad: 16.9 mmHg
AV Peak grad: 30.5 mmHg
Ao pk vel: 2.76 m/s
Area-P 1/2: 5.13 cm2
MV M vel: 4.84 m/s
MV Peak grad: 93.7 mmHg
P 1/2 time: 217 msec
Radius: 0.6 cm
S' Lateral: 2.2 cm

## 2023-01-12 ENCOUNTER — Ambulatory Visit (HOSPITAL_COMMUNITY): Payer: Medicare Other

## 2023-02-19 ENCOUNTER — Ambulatory Visit (INDEPENDENT_AMBULATORY_CARE_PROVIDER_SITE_OTHER): Payer: Medicare Other

## 2023-02-19 DIAGNOSIS — I441 Atrioventricular block, second degree: Secondary | ICD-10-CM | POA: Diagnosis not present

## 2023-02-20 LAB — CUP PACEART REMOTE DEVICE CHECK
Battery Remaining Longevity: 149 mo
Battery Voltage: 3.03 V
Brady Statistic AP VP Percent: 0.44 %
Brady Statistic AP VS Percent: 41.03 %
Brady Statistic AS VP Percent: 0.27 %
Brady Statistic AS VS Percent: 58.26 %
Brady Statistic RA Percent Paced: 41.91 %
Brady Statistic RV Percent Paced: 0.71 %
Date Time Interrogation Session: 20240609220015
Implantable Lead Connection Status: 753985
Implantable Lead Connection Status: 753985
Implantable Lead Implant Date: 20111110
Implantable Lead Implant Date: 20111110
Implantable Lead Location: 753859
Implantable Lead Location: 753860
Implantable Pulse Generator Implant Date: 20220912
Lead Channel Impedance Value: 342 Ohm
Lead Channel Impedance Value: 399 Ohm
Lead Channel Impedance Value: 494 Ohm
Lead Channel Impedance Value: 608 Ohm
Lead Channel Pacing Threshold Amplitude: 0.875 V
Lead Channel Pacing Threshold Amplitude: 1.5 V
Lead Channel Pacing Threshold Pulse Width: 0.4 ms
Lead Channel Pacing Threshold Pulse Width: 0.4 ms
Lead Channel Sensing Intrinsic Amplitude: 1.375 mV
Lead Channel Sensing Intrinsic Amplitude: 1.375 mV
Lead Channel Sensing Intrinsic Amplitude: 2.875 mV
Lead Channel Sensing Intrinsic Amplitude: 2.875 mV
Lead Channel Setting Pacing Amplitude: 1.75 V
Lead Channel Setting Pacing Amplitude: 3 V
Lead Channel Setting Pacing Pulse Width: 0.4 ms
Lead Channel Setting Sensing Sensitivity: 1.2 mV
Zone Setting Status: 755011

## 2023-03-13 NOTE — Progress Notes (Signed)
Remote pacemaker transmission.   

## 2023-03-25 ENCOUNTER — Other Ambulatory Visit: Payer: Self-pay | Admitting: Cardiovascular Disease

## 2023-03-26 NOTE — Telephone Encounter (Signed)
Prescription refill request for Eliquis received. Indication:afib Last office visit:4/24 Scr:0.81  1/24 Age: 77 Weight:98.1  kg  Prescription refilled

## 2023-04-30 ENCOUNTER — Other Ambulatory Visit: Payer: Self-pay | Admitting: Cardiovascular Disease

## 2023-05-21 ENCOUNTER — Ambulatory Visit (INDEPENDENT_AMBULATORY_CARE_PROVIDER_SITE_OTHER): Payer: Medicare Other

## 2023-05-21 DIAGNOSIS — I441 Atrioventricular block, second degree: Secondary | ICD-10-CM

## 2023-05-22 LAB — CUP PACEART REMOTE DEVICE CHECK
Battery Remaining Longevity: 142 mo
Battery Voltage: 3.03 V
Brady Statistic AP VP Percent: 0.46 %
Brady Statistic AP VS Percent: 44.71 %
Brady Statistic AS VP Percent: 0.27 %
Brady Statistic AS VS Percent: 54.55 %
Brady Statistic RA Percent Paced: 45.54 %
Brady Statistic RV Percent Paced: 0.74 %
Date Time Interrogation Session: 20240907230648
Implantable Lead Connection Status: 753985
Implantable Lead Connection Status: 753985
Implantable Lead Implant Date: 20111110
Implantable Lead Implant Date: 20111110
Implantable Lead Location: 753859
Implantable Lead Location: 753860
Implantable Pulse Generator Implant Date: 20220912
Lead Channel Impedance Value: 304 Ohm
Lead Channel Impedance Value: 380 Ohm
Lead Channel Impedance Value: 456 Ohm
Lead Channel Impedance Value: 551 Ohm
Lead Channel Pacing Threshold Amplitude: 1 V
Lead Channel Pacing Threshold Amplitude: 1.5 V
Lead Channel Pacing Threshold Pulse Width: 0.4 ms
Lead Channel Pacing Threshold Pulse Width: 0.4 ms
Lead Channel Sensing Intrinsic Amplitude: 1.375 mV
Lead Channel Sensing Intrinsic Amplitude: 1.375 mV
Lead Channel Sensing Intrinsic Amplitude: 3 mV
Lead Channel Sensing Intrinsic Amplitude: 3 mV
Lead Channel Setting Pacing Amplitude: 2 V
Lead Channel Setting Pacing Amplitude: 3.25 V
Lead Channel Setting Pacing Pulse Width: 0.4 ms
Lead Channel Setting Sensing Sensitivity: 1.2 mV
Zone Setting Status: 755011

## 2023-06-07 NOTE — Progress Notes (Signed)
Remote pacemaker transmission.   

## 2023-07-30 NOTE — Progress Notes (Signed)
 This Document was created using the aid of voice recognition Dragon dictation software.  HPI: Diabetes type 2: Patient's diabetes control is stable.  Patient is currently on glipizide and metformin.  Patient denies any hypoglycemia episodes.  Patient complains of significant pedal edema bilaterally.  Patient reports that he has some cardiovascular issues.  Patient is currently followed by cardiology. Patient denies any nausea or vomiting.  Diet/Exercise: Patient is following diet.   Hyperlipidemia: The patient's cholesterol is well-controlled.  Hypertension: Patient's blood pressure is well controlled.   The following portions of the patient's history were reviewed and updated as appropriate: allergies, current medications, past medical history, past social history and problem list.   Current Outpatient Medications:  .  amLODIPine  (NORVASC ) 5 mg tablet, TAKE 1 TABLET BY MOUTH DAILY., Disp: 90 tablet, Rfl: 3 .  apixaban  (Eliquis ) 5 mg tab, , Disp: , Rfl:  .  atorvastatin  (LIPITOR) 40 mg tablet, TAKE 1 TABLET (40 MG TOTAL) BY MOUTH DAILY., Disp: 90 tablet, Rfl: 3 .  glipiZIDE (GLUCOTROL) 5 mg tablet, TAKE ONE-HALF (1/2) TABLET EVERY MORNING AND 1/2 TABLET EVERY EVENING, Disp: 90 tablet, Rfl: 3 .  glucose blood (OneTouch Ultra Test) test strip, USE TO CHECK BLOOD SUGARS 1 TIMES A DAY, Disp: 100 strip, Rfl: 3 .  irbesartan  (AVAPRO ) 300 mg tablet, Take 1 tablet (300 mg total) by mouth daily., Disp: 15 tablet, Rfl: 0 .  metFORMIN (GLUCOPHAGE) 500 mg tablet, Take 1/2 tablet twice daily., Disp: 90 tablet, Rfl: 3 .  metoprolol  tartrate (LOPRESSOR ) 50 mg tablet, Take  by mouth 2 (two) times a day., Disp: , Rfl:  .  sodium,potassium,mag sulfates (Suprep Bowel Prep Kit) 17.5-3.13-1.6 gram solr, Take as directed per colonoscopy bowel prep instructions, Disp: 1 Bottle, Rfl: 0 .  spironolactone (ALDACTONE) 25 mg tablet, Take 0.5 tablets (12.5 mg total) by mouth daily. APPOINTMENT REQUIRED FOR FURTHER  REFILLS, Disp: 15 tablet, Rfl: 0  Not on File  Patient Active Problem List   Diagnosis Date Noted  Date Diagnosed  . Ingrown toenail of both feet 11/15/2021   . History of colon polyps 07/04/2018   . Constipation 07/04/2018   . Arcus senilis, bilateral 12/25/2017   . Hypertensive retinopathy of both eyes 12/25/2017   . H/O aortic valve replacement 03/08/2017   . Nuclear sclerotic cataract of both eyes 12/21/2016   . Acute bilateral low back pain without sciatica 10/25/2016   . Type 2 diabetes mellitus without complication, without long-term current use of insulin (HCC) 01/28/2016     Overview Note:    Last Assessment & Plan:  Excellent glycemic control. May even need to reduce antidiabetic  medication    . Mixed hyperlipidemia 01/28/2016     Overview Note:    Last Assessment & Plan:  With the exception of low HDL cholesterol, all his lipid parameters are  within the desirable range. Encourage weight loss and physical exercise.    . Essential hypertension 01/28/2016     Overview Note:    Last Assessment & Plan:  Excellent control. No changes made.    . Type 2 diabetes mellitus with both eyes affected by mild nonproliferative retinopathy without macular edema, without long-term current use of insulin (HCC) 01/28/2016     Resolved Problems  No resolved problems to display.    ROS: Appropriate review of system was done and it was negative except for as mentioned in HPI.   VITALS: Vitals:   07/30/23 1411  BP: 138/69  Pulse: 79  LABS/RADIOLOGY/MEDICAL RECORDS:   HbA1c is 5.8 today which is stable     Recent Results (from the past 2688 hour(s))  POC Glucose (Hemocue/NOVA)   Collection Time: 07/30/23  2:25 PM  Result Value Ref Range   Glucose, POC 83 70 - 125 mg/dL   Kit/Device Lot # [id number]    Kit/Device Expiration Date 04/05/24   POC Hemoglobin A1c   Collection Time: 07/30/23  2:25 PM  Result Value Ref Range   Hemoglobin A1c (POC) 5.8 (A) 4.2 - 5.6 %    A1C Information      A1C Diagnostic Criteria: Prediabetes:5.7% - 6.4% Diabetes:>=6.5% A1C Glycemic Goals: Older Adults: <7.0% Children and Adolescents: <7.0% Pregnant Women: <6.0%   Kit/Device Lot # [id number]    Kit/Device Expiration Date 11/07/24      ORDERS: Orders Placed This Encounter  Procedures  . POC Glucose (Hemocue/NOVA)  . POC Hemoglobin A1c   Orders Placed This Encounter  Medications  . metFORMIN (GLUCOPHAGE) 500 mg tablet    Sig: Take 1/2 tablet twice daily.    Dispense:  90 tablet    Refill:  3  . glipiZIDE (GLUCOTROL) 5 mg tablet    Sig: TAKE ONE-HALF (1/2) TABLET EVERY MORNING AND 1/2 TABLET EVERY EVENING    Dispense:  90 tablet    Refill:  3     ASSESSMENT/PLAN: 1. Type 2 diabetes mellitus without complication, without long-term current use of insulin (HCC) I had a detailed discussion about further management of diabetes with the patient.  Continue patient on current dose of glipizide and metformin.  I have advised the patient to discuss with the cardiologist about need for starting Farxiga or Jardiance.  If the cardiologist is okay with it then I will consider starting patient on those medications and adjust the dose of metformin or glipizide lower.  Hopefully those medication can improve the pedal edema in addition to improve cardiovascular outcomes. - POC Glucose (Hemocue/NOVA) - POC Hemoglobin A1c  2. Mixed hyperlipidemia Continue the patient on current cholesterol regimen.   3. Essential hypertension Continue the patient on current blood pressure regimen.     This Document was created using the aid of voice recognition Dragon dictation software.

## 2023-08-20 ENCOUNTER — Ambulatory Visit (INDEPENDENT_AMBULATORY_CARE_PROVIDER_SITE_OTHER): Payer: Medicare Other

## 2023-08-20 DIAGNOSIS — I441 Atrioventricular block, second degree: Secondary | ICD-10-CM

## 2023-08-21 LAB — CUP PACEART REMOTE DEVICE CHECK
Battery Remaining Longevity: 142 mo
Battery Voltage: 3.02 V
Brady Statistic AP VP Percent: 0.71 %
Brady Statistic AP VS Percent: 36.99 %
Brady Statistic AS VP Percent: 0.52 %
Brady Statistic AS VS Percent: 61.79 %
Brady Statistic RA Percent Paced: 37.96 %
Brady Statistic RV Percent Paced: 1.22 %
Date Time Interrogation Session: 20241207054646
Implantable Lead Connection Status: 753985
Implantable Lead Connection Status: 753985
Implantable Lead Implant Date: 20111110
Implantable Lead Implant Date: 20111110
Implantable Lead Location: 753859
Implantable Lead Location: 753860
Implantable Pulse Generator Implant Date: 20220912
Lead Channel Impedance Value: 323 Ohm
Lead Channel Impedance Value: 361 Ohm
Lead Channel Impedance Value: 456 Ohm
Lead Channel Impedance Value: 570 Ohm
Lead Channel Pacing Threshold Amplitude: 0.875 V
Lead Channel Pacing Threshold Amplitude: 1.5 V
Lead Channel Pacing Threshold Pulse Width: 0.4 ms
Lead Channel Pacing Threshold Pulse Width: 0.4 ms
Lead Channel Sensing Intrinsic Amplitude: 1.375 mV
Lead Channel Sensing Intrinsic Amplitude: 1.375 mV
Lead Channel Sensing Intrinsic Amplitude: 2.75 mV
Lead Channel Sensing Intrinsic Amplitude: 2.75 mV
Lead Channel Setting Pacing Amplitude: 1.75 V
Lead Channel Setting Pacing Amplitude: 3 V
Lead Channel Setting Pacing Pulse Width: 0.4 ms
Lead Channel Setting Sensing Sensitivity: 1.2 mV
Zone Setting Status: 755011

## 2023-09-27 NOTE — Addendum Note (Signed)
Addended by: Geralyn Flash D on: 09/27/2023 12:02 PM   Modules accepted: Orders

## 2023-09-27 NOTE — Progress Notes (Signed)
Remote pacemaker transmission.   

## 2023-11-16 NOTE — Progress Notes (Signed)
 This Document was created using the aid of voice recognition Dragon dictation software.  HPI: Diabetes type 2: Patient's diabetes control is stable.   Patient is currently on glipizide and metformin.  Patient denies any hypoglycemia episodes.  Patient reports that the primary doctor wants the patient to be started on Jardiance instead of the glipizide because of cardiovascular protection.  Patient however has not started taking the Jardiance yet.  Patient is getting medications through [facility]. Patient denies any nausea or vomiting.  Diet/Exercise: Patient is following diet.   Hyperlipidemia: Patient is on Lipitor.  Patient had labs done through [facility] recently. Hypertension: Patient's blood pressure is well controlled.   The following portions of the patient's history were reviewed and updated as appropriate: allergies, current medications, past medical history, past social history and problem list.   Current Outpatient Medications:  .  amLODIPine  (NORVASC ) 5 mg tablet, Take 1 tablet (5 mg total) by mouth daily., Disp: 90 tablet, Rfl: 3 .  apixaban  (Eliquis ) 5 mg tab, , Disp: , Rfl:  .  atorvastatin  (LIPITOR) 40 mg tablet, Take 1 tablet (40 mg total) by mouth daily., Disp: 90 tablet, Rfl: 3 .  empagliflozin (JARDIANCE) 25 mg tab, Take 0.5 tablets (12.5 mg total) by mouth daily., Disp: 45 tablet, Rfl: 1 .  glucose blood (OneTouch Ultra Test) test strip, USE TO CHECK BLOOD SUGARS 1 TIMES A DAY, Disp: 100 strip, Rfl: 3 .  irbesartan  (AVAPRO ) 300 mg tablet, Take 1 tablet (300 mg total) by mouth daily., Disp: 90 tablet, Rfl: 1 .  metFORMIN (GLUCOPHAGE) 500 mg tablet, Take 1/2 tablet twice daily., Disp: 90 tablet, Rfl: 3 .  metoprolol  tartrate (LOPRESSOR ) 50 mg tablet, Take  by mouth 2 (two) times a day., Disp: , Rfl:  .  sodium,potassium,mag sulfates (Suprep Bowel Prep Kit) 17.5-3.13-1.6 gram solr, Take as directed per colonoscopy bowel prep instructions, Disp: 1 Bottle, Rfl: 0 .   spironolactone (ALDACTONE) 25 mg tablet, Take 0.5 tablets (12.5 mg total) by mouth daily. APPOINTMENT REQUIRED FOR FURTHER REFILLS, Disp: 15 tablet, Rfl: 0  Not on File  Patient Active Problem List   Diagnosis Date Noted  Date Diagnosed  . Ingrown toenail of both feet 11/15/2021   . History of colon polyps 07/04/2018   . Constipation 07/04/2018   . Arcus senilis, bilateral 12/25/2017   . Hypertensive retinopathy of both eyes 12/25/2017   . H/O aortic valve replacement 03/08/2017   . Nuclear sclerotic cataract of both eyes 12/21/2016   . Acute bilateral low back pain without sciatica 10/25/2016   . Type 2 diabetes mellitus without complication, without long-term current use of insulin (HCC) 01/28/2016     Overview Note:    Last Assessment & Plan:  Excellent glycemic control. May even need to reduce antidiabetic  medication    . Mixed hyperlipidemia 01/28/2016     Overview Note:    Last Assessment & Plan:  With the exception of low HDL cholesterol, all his lipid parameters are  within the desirable range. Encourage weight loss and physical exercise.    . Essential hypertension 01/28/2016     Overview Note:    Last Assessment & Plan:  Excellent control. No changes made.    . Type 2 diabetes mellitus with both eyes affected by mild nonproliferative retinopathy without macular edema, without long-term current use of insulin (HCC) 01/28/2016     Resolved Problems  No resolved problems to display.    ROS: Appropriate review of system was done and  it was negative except for as mentioned in HPI.   VITALS: Vitals:   11/16/23 0811  BP: 140/67  Pulse: 72    PHYSICAL EXAM:  DIABETIC FOOT EXAM:  Shoes and socks were removed.  No data recorded  normal DP and PT pulses, no trophic changes or ulcerative lesions, monofilament sensation reduced at both feet, and edema absent Elizbeth Blanch, MD 11/16/2023 8:19 AM    LABS/RADIOLOGY/MEDICAL RECORDS:   HbA1c is 5.8 today which is  stable.     Recent Results (from the past 16 weeks)  POC Glucose (Hemocue/NOVA)   Collection Time: 07/30/23  2:25 PM  Result Value Ref Range   Glucose, POC 83 70 - 125 mg/dL   Kit/Device Lot # [id number]    Kit/Device Expiration Date 04/05/24   POC Hemoglobin A1c   Collection Time: 07/30/23  2:25 PM  Result Value Ref Range   Hemoglobin A1c (POC) 5.8 (A) 4.2 - 5.6 %   A1C Information      A1C Diagnostic Criteria: Prediabetes:5.7% - 6.4% Diabetes:>=6.5% A1C Glycemic Goals: Older Adults: <7.0% Children and Adolescents: <7.0% Pregnant Women: <6.0%   Kit/Device Lot # [id number]    Kit/Device Expiration Date 11/07/24   POC Glucose (Hemocue/NOVA)   Collection Time: 11/16/23  8:13 AM  Result Value Ref Range   Glucose, POC 127 (A) 70 - 125 mg/dL   Kit/Device Lot # [id number]    Kit/Device Expiration Date 04/05/24      ORDERS: Orders Placed This Encounter  Procedures  . POC Glucose (Hemocue/NOVA)  . POC Hemoglobin A1c   Orders Placed This Encounter  Medications  . metFORMIN (GLUCOPHAGE) 500 mg tablet    Sig: Take 1/2 tablet twice daily.    Dispense:  90 tablet    Refill:  3  . atorvastatin  (LIPITOR) 40 mg tablet    Sig: Take 1 tablet (40 mg total) by mouth daily.    Dispense:  90 tablet    Refill:  3  . amLODIPine  (NORVASC ) 5 mg tablet    Sig: Take 1 tablet (5 mg total) by mouth daily.    Dispense:  90 tablet    Refill:  3  . empagliflozin (JARDIANCE) 25 mg tab    Sig: Take 0.5 tablets (12.5 mg total) by mouth daily.    Dispense:  45 tablet    Refill:  1  . irbesartan  (AVAPRO ) 300 mg tablet    Sig: Take 1 tablet (300 mg total) by mouth daily.    Dispense:  90 tablet    Refill:  1     ASSESSMENT/PLAN: 1. Type 2 diabetes mellitus with both eyes affected by mild nonproliferative retinopathy without macular edema, without long-term current use of insulin (HCC) (Primary) I had a detailed discussion about further management of diabetes with the patient.  I have discontinued  glipizide and advised the patient to start taking the Jardiance at 25 mg 1/2 tablet daily.  Continue with metformin.  Hopefully patient will have good diabetes control with Jardiance.  Reevaluate the patient in 3 months and monitor the A1c. - POC Glucose (Hemocue/NOVA) - POC Hemoglobin A1c  2. Mixed hyperlipidemia Continue the patient on current cholesterol regimen.   3. Essential hypertension Continue the patient on current blood pressure regimen.  Refill prescriptions.    This Document was created using the aid of voice recognition Dragon dictation software.

## 2023-11-19 ENCOUNTER — Ambulatory Visit (INDEPENDENT_AMBULATORY_CARE_PROVIDER_SITE_OTHER): Payer: Medicare Other

## 2023-11-19 DIAGNOSIS — I441 Atrioventricular block, second degree: Secondary | ICD-10-CM | POA: Diagnosis not present

## 2023-11-20 LAB — CUP PACEART REMOTE DEVICE CHECK
Battery Remaining Longevity: 140 mo
Battery Voltage: 3.02 V
Brady Statistic AP VP Percent: 1.26 %
Brady Statistic AP VS Percent: 35.15 %
Brady Statistic AS VP Percent: 0.93 %
Brady Statistic AS VS Percent: 62.66 %
Brady Statistic RA Percent Paced: 36.88 %
Brady Statistic RV Percent Paced: 2.19 %
Date Time Interrogation Session: 20250310143720
Implantable Lead Connection Status: 753985
Implantable Lead Connection Status: 753985
Implantable Lead Implant Date: 20111110
Implantable Lead Implant Date: 20111110
Implantable Lead Location: 753859
Implantable Lead Location: 753860
Implantable Pulse Generator Implant Date: 20220912
Lead Channel Impedance Value: 342 Ohm
Lead Channel Impedance Value: 380 Ohm
Lead Channel Impedance Value: 494 Ohm
Lead Channel Impedance Value: 608 Ohm
Lead Channel Pacing Threshold Amplitude: 0.75 V
Lead Channel Pacing Threshold Amplitude: 1.375 V
Lead Channel Pacing Threshold Pulse Width: 0.4 ms
Lead Channel Pacing Threshold Pulse Width: 0.4 ms
Lead Channel Sensing Intrinsic Amplitude: 1.625 mV
Lead Channel Sensing Intrinsic Amplitude: 1.625 mV
Lead Channel Sensing Intrinsic Amplitude: 3 mV
Lead Channel Sensing Intrinsic Amplitude: 3 mV
Lead Channel Setting Pacing Amplitude: 1.75 V
Lead Channel Setting Pacing Amplitude: 2.75 V
Lead Channel Setting Pacing Pulse Width: 0.4 ms
Lead Channel Setting Sensing Sensitivity: 1.2 mV
Zone Setting Status: 755011

## 2023-11-21 ENCOUNTER — Encounter: Payer: Self-pay | Admitting: Cardiovascular Disease

## 2023-12-24 NOTE — Progress Notes (Signed)
 Remote pacemaker transmission.

## 2023-12-24 NOTE — Addendum Note (Signed)
 Addended by: Edra Govern D on: 12/24/2023 03:31 PM   Modules accepted: Orders

## 2024-02-11 NOTE — Telephone Encounter (Signed)
 In an effort to ensure that our patiens [employer], A Teammate has reviewed your chart and identified that you are due for: Medicare Annual Wellness visit.  The Outcome was    Contact was not made: letter sent, left message.   Source of outreach request was Automated call/text  If you have any questions or need help with scheduling, contact our [facility] Team at [phone]

## 2024-02-18 ENCOUNTER — Ambulatory Visit (INDEPENDENT_AMBULATORY_CARE_PROVIDER_SITE_OTHER): Payer: Medicare Other

## 2024-02-18 DIAGNOSIS — I441 Atrioventricular block, second degree: Secondary | ICD-10-CM | POA: Diagnosis not present

## 2024-02-19 LAB — CUP PACEART REMOTE DEVICE CHECK
Battery Remaining Longevity: 137 mo
Battery Voltage: 3.02 V
Brady Statistic AP VP Percent: 1.32 %
Brady Statistic AP VS Percent: 39.22 %
Brady Statistic AS VP Percent: 1.22 %
Brady Statistic AS VS Percent: 58.23 %
Brady Statistic RA Percent Paced: 40.99 %
Brady Statistic RV Percent Paced: 2.54 %
Date Time Interrogation Session: 20250609014021
Implantable Lead Connection Status: 753985
Implantable Lead Connection Status: 753985
Implantable Lead Implant Date: 20111110
Implantable Lead Implant Date: 20111110
Implantable Lead Location: 753859
Implantable Lead Location: 753860
Implantable Pulse Generator Implant Date: 20220912
Lead Channel Impedance Value: 323 Ohm
Lead Channel Impedance Value: 380 Ohm
Lead Channel Impedance Value: 456 Ohm
Lead Channel Impedance Value: 589 Ohm
Lead Channel Pacing Threshold Amplitude: 0.875 V
Lead Channel Pacing Threshold Amplitude: 1.125 V
Lead Channel Pacing Threshold Pulse Width: 0.4 ms
Lead Channel Pacing Threshold Pulse Width: 0.4 ms
Lead Channel Sensing Intrinsic Amplitude: 1.5 mV
Lead Channel Sensing Intrinsic Amplitude: 1.5 mV
Lead Channel Sensing Intrinsic Amplitude: 3.125 mV
Lead Channel Sensing Intrinsic Amplitude: 3.125 mV
Lead Channel Setting Pacing Amplitude: 1.75 V
Lead Channel Setting Pacing Amplitude: 2.5 V
Lead Channel Setting Pacing Pulse Width: 0.4 ms
Lead Channel Setting Sensing Sensitivity: 1.2 mV
Zone Setting Status: 755011

## 2024-03-03 ENCOUNTER — Ambulatory Visit: Payer: Self-pay | Admitting: Cardiovascular Disease

## 2024-03-29 ENCOUNTER — Telehealth: Payer: Self-pay | Admitting: Home Health

## 2024-03-29 MED ORDER — APIXABAN 5 MG PO TABS: 5.0000 mg | ORAL_TABLET | Freq: Two times a day (BID) | ORAL | 0 refills | Status: DC

## 2024-03-29 NOTE — Telephone Encounter (Signed)
 Patient called after hour line, states [facility] is having issue delivering his Eliquis . He has one pill left for this evening. He asked if new script can be sent to [facility] today so he does not ran out. He has not been seen since 12/2022, no bleeding issue. 1 month Eliquis  refill sent to [facility] today. Advised the patient to arrange follow up appointment with Dr C office for annual visit. He has agreed.

## 2024-04-02 NOTE — Progress Notes (Signed)
 Remote pacemaker transmission.

## 2024-04-02 NOTE — Addendum Note (Signed)
 Addended by: TAWNI DRILLING D on: 04/02/2024 12:46 PM   Modules accepted: Orders

## 2024-05-15 ENCOUNTER — Ambulatory Visit: Attending: Cardiovascular Disease | Admitting: Cardiovascular Disease

## 2024-05-15 ENCOUNTER — Encounter: Payer: Self-pay | Admitting: Cardiovascular Disease

## 2024-05-15 VITALS — BP 137/69 | HR 61 | Ht 71.0 in | Wt 206.7 lb

## 2024-05-15 DIAGNOSIS — E119 Type 2 diabetes mellitus without complications: Secondary | ICD-10-CM | POA: Diagnosis present

## 2024-05-15 DIAGNOSIS — E785 Hyperlipidemia, unspecified: Secondary | ICD-10-CM | POA: Diagnosis present

## 2024-05-15 DIAGNOSIS — D6869 Other thrombophilia: Secondary | ICD-10-CM | POA: Insufficient documentation

## 2024-05-15 DIAGNOSIS — I10 Essential (primary) hypertension: Secondary | ICD-10-CM | POA: Diagnosis present

## 2024-05-15 DIAGNOSIS — Z952 Presence of prosthetic heart valve: Secondary | ICD-10-CM | POA: Diagnosis present

## 2024-05-15 DIAGNOSIS — I441 Atrioventricular block, second degree: Secondary | ICD-10-CM | POA: Insufficient documentation

## 2024-05-15 DIAGNOSIS — Z950 Presence of cardiac pacemaker: Secondary | ICD-10-CM | POA: Diagnosis present

## 2024-05-15 DIAGNOSIS — I351 Nonrheumatic aortic (valve) insufficiency: Secondary | ICD-10-CM | POA: Insufficient documentation

## 2024-05-15 DIAGNOSIS — I350 Nonrheumatic aortic (valve) stenosis: Secondary | ICD-10-CM | POA: Diagnosis present

## 2024-05-15 DIAGNOSIS — I480 Paroxysmal atrial fibrillation: Secondary | ICD-10-CM | POA: Diagnosis not present

## 2024-05-15 NOTE — Patient Instructions (Signed)
 Medication Instructions:  No changes *If you need a refill on your cardiac medications before your next appointment, please call your pharmacy*  Lab Work: None ordered If you have labs (blood work) drawn today and your tests are completely normal, you will receive your results only by: MyChart Message (if you have MyChart) OR A paper copy in the mail If you have any lab test that is abnormal or we need to change your treatment, we will call you to review the results.  Testing/Procedures: Your physician has requested that you have an echocardiogram. Echocardiography is a painless test that uses sound waves to create images of your heart. It provides your doctor with information about the size and shape of your heart and how well your heart's chambers and valves are working. This procedure takes approximately one hour. There are no restrictions for this procedure. Please do NOT wear cologne, perfume, aftershave, or lotions (deodorant is allowed). Please arrive 15 minutes prior to your appointment time.  Please note: We ask at that you not bring children with you during ultrasound (echo/ vascular) testing. Due to room size and safety concerns, children are not allowed in the ultrasound rooms during exams. Our front office staff cannot provide observation of children in our lobby area while testing is being conducted. An adult accompanying a patient to their appointment will only be allowed in the ultrasound room at the discretion of the ultrasound technician under special circumstances. We apologize for any inconvenience.   Follow-Up: At [facility], you and your health needs are our priority.  As part of our continuing mission to provide you with exceptional heart care, our providers are all part of one team.  This team includes your primary Cardiologist (physician) and Advanced Practice Providers or APPs (Physician Assistants and Nurse Practitioners) who all work together to provide you  with the care you need, when you need it.  Your next appointment:   1 year(s)  Provider:   Jerel Balding, MD    We recommend signing up for the patient portal called MyChart.  Sign up information is provided on this After Visit Summary.  MyChart is used to connect with patients for Virtual Visits (Telemedicine).  Patients are able to view lab/test results, encounter notes, upcoming appointments, etc.  Non-urgent messages can be sent to your provider as well.   To learn more about what you can do with MyChart, go to [email or web address].

## 2024-05-15 NOTE — Progress Notes (Signed)
 Patient ID: [name] [name], male   DOB: 12/16/1945, 78 y.o.   MRN: [id number]    Cardiology Office Note    Date:  05/15/2024   ID:  [name] [name], DOB 01-31-1946, MRN [id number]  PCP:  [name], MD  Cardiologist:   Jerel Balding, MD   Chief Complaint  Patient presents with   Cardiac Valve Problem   Pacemaker Check    History of Present Illness:  [name] [name] is a 78 y.o. male who presents for follow-up for valvular heart disease and AV block S/P dual-chamber permanent pacemaker November 2011 (Medtronic Azure, generator change out 2022), paroxysmal atrial fibrillation and history of aortic stenosis S/P aortic valve replacement with a 21 mm bioprosthesis in January 2012, history of prostate cancer.   [name] has no specific cardiovascular complaints except that he feels lack of motivation to perform physical activity.  He does not have chest pain or shortness of breath with activity and denies dizziness, palpitations, syncope, focal neurological complaints, lower extremity edema or intermittent claudication.  Somehow he forgot to take his atorvastatin , although he had a full bottle in his medicine cabinet.  His lipid profile was quite unfavorable off medications (total cholesterol 204, HDL 38, LDL 141, triglycerides 128 on 05/01/2024).  He he has started taking the atorvastatin  again since then.  Routine chemistries and thyroid function test were normal just a couple of weeks ago.  I could not find a recent CBC.  Pacemaker interrogation shows normal device function.  Estimated generator longevity is 11 years and although lead parameters are normal.  He only has roughly 41% atrial pacing and 2% ventricular pacing and there have been no episodes of atrial fibrillation or high ventricular rates.  His most recent echocardiogram performed roughly a year ago showed some deterioration in aortic valve prosthetic parameters with a mean gradient that had increased to 17 mmHg with a dimensionless  index of 0.44.  It also showed mild-moderate mitral stenosis with a mean mitral gradient of 8 mmHg at a heart rate of 76 bpm (due to moderate or severe mitral Neri calcification).  Left ventricular systolic function remains normal.  Systolic PA pressure appears to be mildly elevated at approximately 41 mmHg.  His blood pressure was a little high on arrival today, but upper normal range when rechecked.  He underwent aortic valve replacement in January 2012 (21 mm bioprosthesis) for symptomatic aortic stenosis. Early on, his prosthesis showed evidence of mild to moderate aortic insufficiency that has not progressed over time. He has preserved left ventricular systolic function.  He did have postoperative atrial fibrillation.  In November 2011 he received a dual-chamber permanent pacemaker for syncope that was likely related to second degree atrioventricular block. He has an MRI conditional Medtronic Revo device (reached ERI in July 2022 and underwent generator change out with a Medtronic Azure XT device). 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   Past Medical History:  Diagnosis Date   Anticoagulant long-term use    eliquis --- managed by cardiology   Aortic valve insufficiency    cardiologist--- dr balding;   01/ 2012  s/p AVR for severe AS , bicusipd;  last echo 12-25-2019  mild AR no stenosis, mean grandiant   BPH associated with nocturia    Coronary artery disease    cardiologist--- dr Amia Rynders;   nuclear stress test 02-28-2007  normal perfusion no ischemia, nuclear ef 58%;   cardiac cath 06-30-2010  severe  AS and nonobstructive disease involving LAD/ CFx/ RCA   Heart murmur    History of non-ST elevation myocardial infarction (NSTEMI) 06/2010   History of ventricular tachycardia 06/2010   nonsustained VT in setting NSTEMI; severe AS   Hypertension    Malignant neoplasm prostate (HCC) 07/2021    urologist--- dr gay:  dx 11/ 2022 Gleason 4+3, PSA 11.9   Mitral insufficiency    per echo 12-25-2019  mile regurg,  no stenosis, degenerative   Mixed dyslipidemia    Mobitz type 1 second degree AV block 06/2010   symptomatic syncope;  s/p PPM   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 patel   (01-13-2022  pt stated only checks sugar occasionally   Wears glasses     Past Surgical History:  Procedure Laterality Date   AORTIC VALVE REPLACEMENT  10/06/2010   @[facility]   by dr emeterio Celestia Staple Ease prosthesis   CARDIAC CATHETERIZATION  06/30/2010   @[facility]   ;   nonobstructive CAD, heavily ca+ AOV   GOLD SEED IMPLANT N/A 01/17/2022   Procedure: GOLD SEED IMPLANT;  Surgeon: Selma Donnice SAUNDERS, MD;  Location: [facility];  Service: Urology;  Laterality: N/A;  30 MINS   LUMBAR DISC SURGERY     yrs ago   PERMANENT PACEMAKER INSERTION  07/21/2010   @[facility]  by dr Shaylen Nephew;  Medtronic   PPM GENERATOR CHANGEOUT N/A 05/23/2021   Procedure: PPM GENERATOR CHANGEOUT;  Surgeon: Francyne Headland, MD;  Location: [facility] INVASIVE CV LAB;  Service: Cardiovascular;  Laterality: N/A;   SPACE OAR INSTILLATION N/A 01/17/2022   Procedure: SPACE OAR INSTILLATION;  Surgeon: Selma Donnice SAUNDERS, MD;  Location: [facility];  Service: Urology;  Laterality: N/A;    Outpatient Medications Prior to Visit  Medication Sig Dispense Refill   amLODipine  (NORVASC ) 5 MG tablet Take 1 tablet (5 mg total) by mouth daily. (Patient taking differently: Take 5 mg by mouth  daily.) 90 tablet 3   apixaban  (ELIQUIS ) 5 MG TABS tablet Take 1 tablet (5 mg total) by mouth 2 (two) times daily. 60 tablet 0   atorvastatin  (LIPITOR) 40 MG tablet Take 1 tablet by mouth daily.     empagliflozin (JARDIANCE) 25 MG TABS tablet Take 12.5 mg by mouth daily.     irbesartan  (AVAPRO ) 300 MG tablet Take 300 mg by mouth daily.     metFORMIN (GLUCOPHAGE) 500 MG tablet Take 0.5 tablets by mouth 2 (two) times daily.     metoprolol  tartrate (LOPRESSOR ) 50 MG tablet TAKE 1 TABLET TWICE A DAY 180 tablet 2   spironolactone (ALDACTONE) 25 MG tablet Take 12.5 mg by mouth once. (Patient not taking: Reported on 05/15/2024)     glipiZIDE (GLUCOTROL) 5 MG tablet Take 2.5 mg by mouth 2 (two) times daily before a meal.     No facility-administered medications prior to visit.     Allergies:   Patient has no known allergies.   Family History:  The patient's brother recently  underwent prostatectomy for prostate cancer  ROS:   Please see the history of present illness.    ROS All other systems are reviewed and are negative.   PHYSICAL EXAM:   VS:  BP 137/69   Pulse 61   Ht 5' 11 (1.803 m)   Wt 206 lb 11.2 oz (93.8 kg)   SpO2 98%   BMI 28.83 kg/m      General: Alert, oriented x3, no distress, healthy left subclavian pacemaker site Head: no evidence of trauma, PERRL, EOMI, no exophtalmos or lid lag, no myxedema, no xanthelasma; normal ears, nose and oropharynx Neck: normal jugular venous pulsations and no hepatojugular reflux; brisk carotid pulses without delay and no carotid bruits Chest: clear to auscultation, no signs of consolidation by percussion or palpation, normal fremitus, symmetrical and full respiratory excursions Cardiovascular: normal position and quality of the apical impulse, regular rhythm, normal first and widely split second heart sounds, 2/6 early peaking aortic ejection murmur and 1/6 diastolic decrescendo murmur, no rubs or gallops Abdomen: no tenderness or distention, no  masses by palpation, no abnormal pulsatility or arterial bruits, normal bowel sounds, no hepatosplenomegaly Extremities: no clubbing, cyanosis or edema; 2+ radial, ulnar and brachial pulses bilaterally; 2+ right femoral, posterior tibial and dorsalis pedis pulses; 2+ left femoral, posterior tibial and dorsalis pedis pulses; no subclavian or femoral bruits Neurological: grossly nonfocal Psych: Normal mood and affect   Wt Readings from Last 3 Encounters:  05/15/24 206 lb 11.2 oz (93.8 kg)  12/14/22 216 lb 3.2 oz (98.1 kg)  06/19/22 210 lb 11.2 oz (95.6 kg)      Studies/Labs Reviewed:  Echocardiogram 01/09/2023  1. Left ventricular ejection fraction, by estimation, is 60 to 65%. The  left ventricle has normal function. The left ventricle has no regional  wall motion abnormalities. There is moderate left ventricular hypertrophy  of the basal-septal segment. Left  ventricular diastolic parameters are indeterminate.   2. Right ventricular systolic function is mildly reduced. The right  ventricular size is mildly enlarged. There is mildly elevated pulmonary  artery systolic pressure. The estimated right ventricular systolic  pressure is 41.4 mmHg.   3. Left atrial size was mild to moderately dilated.   4. Tiny mobile, likely degenerative strands on coaptation point of mitral  valve, may have also been present on prior study though less well  visualized. The mitral valve is degenerative. Mild mitral valve  regurgitation. Mild-moderate mitral stenosis. The   mean mitral valve gradient is 8.1 mmHg with average heart rate of 76 bpm.  DVI 0.51. AVA 1.78 cm2 by continuity. Moderate-severe mitral annular  calcification.   5. Tricuspid valve regurgitation is mild to moderate.   6. The aortic valve has been repaired/replaced. Aortic valve  regurgitation is mild. There is a 21 mm Magna-Ease pericardial valve  present in the aortic position. Procedure Date: 10/06/2010. Aortic valve  area, by VTI  measures 1.54 cm. Aortic valve mean  gradient measures 16.9 mmHg. Aortic valve Vmax measures 2.76 m/s. Grossly  normal prosthesis with AT 92 ms, AT/ET 0.29, DVI 0.44, mild increase in  mean gradient, consider closer follow up echocardiogram.   7. Aortic dilatation noted. There is mild dilatation of the ascending  aorta, measuring 41 mm.   8. The inferior vena cava is normal in size with <50% respiratory  variability, suggesting right atrial pressure of 8 mmHg.   EKG:    EKG Interpretation Date/Time:  Thursday May 15 2024 08:39:32 EDT Ventricular Rate:  61 PR Interval:  318 QRS Duration:  148 QT Interval:  452 QTC Calculation: 455 R Axis:   -70  Text Interpretation: Atrial-paced rhythm with prolonged AV conduction Left axis deviation Right bundle branch block Inferior infarct (cited on or before 15-May-2024) When compared with ECG of 23-May-2021 17:50, Electronic atrial pacemaker has replaced Sinus rhythm Confirmed by Holston Oyama ([id number]) on 05/15/2024 8:44:12 AM         Recent Labs: 08/09/2021 Creatinine 0.7, potassium 5.3   Lipid Panel    Component Value Date/Time   CHOL 142 12/27/2020 1151   TRIG 108 12/27/2020 1151   HDL 36 (L) 12/27/2020 1151   CHOLHDL 3.9 12/27/2020 1151   CHOLHDL 4.3 11/28/2016 0919   VLDL 23 11/28/2016 0919   LDLCALC 86 12/27/2020 1151   03 29 2021 Cholesterol 137, HDL 39, LDL 83, triglycerides 77 Hemoglobin A1c 5.9% Creatinine 0.86, potassium 4.8, normal liver function tests  12/27/2020 Cholesterol 142, HDL 36, LDL 86, triglycerides 108 Hemoglobin A1c 6.1% Creatinine 0.95, potassium 4.6, normal liver function tests  10/09/2022  creatinine 0.81, normal liver function tests, normal electrolytes, glucose 116, hemoglobin A1c 7.1% cholesterol 150, triglycerides 93, HDL 43, direct LDL 96.   Hemoglobin 14.5 on 06/14/2022.  05/01/2024 total cholesterol 204, HDL 38, LDL 141, triglycerides 128 (off atorvastatin ) Potassium 4.4,  creatinine 0.83, normal liver function tests, normal TSH  ASSESSMENT:    1. Paroxysmal atrial fibrillation (HCC)   2. Second degree AV block, Mobitz type I   3. Pacemaker   4. Acquired thrombophilia (HCC)   5. S/P bioprosthetic AVR - 21 mm [facility], Dr. Fleeta Trigt 09/2010   6. Nonrheumatic aortic valve insufficiency   7. Nonrheumatic aortic valve stenosis   8. Essential hypertension   9. Type 2 diabetes mellitus without complication, without long-term current use of insulin (HCC)   10. Dyslipidemia (high LDL; low HDL)      PLAN:  In order of problems listed above:   PAF: Extremely low prevalence of atrial fibrillation in the last year.  CHA2DS2-VASc 4 (age 59, hypertension, diabetes) he has no history of embolic events.  On anticoagulation. Second degree AV block: He has the equivalent of trifascicular block (RBBB, left axis deviation, long AV delay), but fortunately has very little ventricular pacing, less than 1%.   Pacemaker: Recent generator change out, normal device function, continue remote downloads every 3 months.   Anticoagulation: Denies falls or bleeding problems. S/P AVR: Echo last year's showed some slight increase in gradients.  He has had mild aortic insufficiency ever since surgery.  Recheck echo, which will also allow us  to reevaluate what appears to be some mild degree of mitral stenosis due to degenerative mitral annular calcification. HTN: Fair control. DM: On metformin and Jardiance.  Reports good hemoglobin A1c when checked at the [facility]. HLP: Unfortunately stopped his atorvastatin , but has started back.  Chronically low HDL cholesterol.  Regular physical exercise and weight loss would help.   Medication Adjustments/Labs and Tests Ordered: Current medicines are reviewed at length with the patient today.  Concerns regarding medicines are outlined above.  Medication changes, Labs and Tests ordered today are listed in the Patient Instructions below. Patient  Instructions  Medication Instructions:  No changes *If you need a refill on your cardiac medications before your next appointment, please call your pharmacy*  Lab Work: None ordered If you have labs (blood work) drawn today and your tests are completely normal, you will receive your results only by: MyChart Message (if you have MyChart) OR  A paper copy in the mail If you have any lab test that is abnormal or we need to change your treatment, we will call you to review the results.  Testing/Procedures: Your physician has requested that you have an echocardiogram. Echocardiography is a painless test that uses sound waves to create images of your heart. It provides your doctor with information about the size and shape of your heart and how well your heart's chambers and valves are working. This procedure takes approximately one hour. There are no restrictions for this procedure. Please do NOT wear cologne, perfume, aftershave, or lotions (deodorant is allowed). Please arrive 15 minutes prior to your appointment time.  Please note: We ask at that you not bring children with you during ultrasound (echo/ vascular) testing. Due to room size and safety concerns, children are not allowed in the ultrasound rooms during exams. Our front office staff cannot provide observation of children in our lobby area while testing is being conducted. An adult accompanying a patient to their appointment will only be allowed in the ultrasound room at the discretion of the ultrasound technician under special circumstances. We apologize for any inconvenience.   Follow-Up: At [facility], you and your health needs are our priority.  As part of our continuing mission to provide you with exceptional heart care, our providers are all part of one team.  This team includes your primary Cardiologist (physician) and Advanced Practice Providers or APPs (Physician Assistants and Nurse Practitioners) who all work together  to provide you with the care you need, when you need it.  Your next appointment:   1 year(s)  Provider:   Jerel Balding, MD    We recommend signing up for the patient portal called MyChart.  Sign up information is provided on this After Visit Summary.  MyChart is used to connect with patients for Virtual Visits (Telemedicine).  Patients are able to view lab/test results, encounter notes, upcoming appointments, etc.  Non-urgent messages can be sent to your provider as well.   To learn more about what you can do with MyChart, go to [email or web address].            Signed, Jerel Balding, MD  05/15/2024 6:33 PM    [facility] [address] [place], [place], [place]  [address] Phone: [phone]; Fax: [phone]

## 2024-05-19 ENCOUNTER — Other Ambulatory Visit: Payer: Self-pay | Admitting: Cardiovascular Disease

## 2024-05-19 ENCOUNTER — Ambulatory Visit: Payer: Medicare Other

## 2024-05-19 DIAGNOSIS — I441 Atrioventricular block, second degree: Secondary | ICD-10-CM

## 2024-05-20 LAB — CUP PACEART REMOTE DEVICE CHECK
Battery Remaining Longevity: 134 mo
Battery Voltage: 3.01 V
Brady Statistic AP VP Percent: 1.4 %
Brady Statistic AP VS Percent: 50.43 %
Brady Statistic AS VP Percent: 0.51 %
Brady Statistic AS VS Percent: 47.67 %
Brady Statistic RA Percent Paced: 51.82 %
Brady Statistic RV Percent Paced: 1.9 %
Date Time Interrogation Session: 20250907232556
Implantable Lead Connection Status: 753985
Implantable Lead Connection Status: 753985
Implantable Lead Implant Date: 20111110
Implantable Lead Implant Date: 20111110
Implantable Lead Location: 753859
Implantable Lead Location: 753860
Implantable Pulse Generator Implant Date: 20220912
Lead Channel Impedance Value: 323 Ohm
Lead Channel Impedance Value: 399 Ohm
Lead Channel Impedance Value: 475 Ohm
Lead Channel Impedance Value: 608 Ohm
Lead Channel Pacing Threshold Amplitude: 0.875 V
Lead Channel Pacing Threshold Amplitude: 1.25 V
Lead Channel Pacing Threshold Pulse Width: 0.4 ms
Lead Channel Pacing Threshold Pulse Width: 0.4 ms
Lead Channel Sensing Intrinsic Amplitude: 1.5 mV
Lead Channel Sensing Intrinsic Amplitude: 1.5 mV
Lead Channel Sensing Intrinsic Amplitude: 2.75 mV
Lead Channel Sensing Intrinsic Amplitude: 2.75 mV
Lead Channel Setting Pacing Amplitude: 1.75 V
Lead Channel Setting Pacing Amplitude: 2.5 V
Lead Channel Setting Pacing Pulse Width: 0.4 ms
Lead Channel Setting Sensing Sensitivity: 1.2 mV
Zone Setting Status: 755011

## 2024-05-22 ENCOUNTER — Ambulatory Visit: Payer: Self-pay | Admitting: Cardiovascular Disease

## 2024-05-29 NOTE — Progress Notes (Signed)
 Remote PPM Transmission

## 2024-06-06 ENCOUNTER — Other Ambulatory Visit: Payer: Self-pay | Admitting: Cardiovascular Disease

## 2024-06-06 DIAGNOSIS — I480 Paroxysmal atrial fibrillation: Secondary | ICD-10-CM

## 2024-06-06 NOTE — Telephone Encounter (Signed)
 Eliquis  5mg  refill request received. Patient is 78 years old, weight-93.8kg, Crea-0.83 on 05/01/24, Diagnosis-Afib, and last seen by Dr. Francyne on 05/15/24. Dose is appropriate based on dosing criteria. Will send in refill to requested pharmacy.

## 2024-06-19 ENCOUNTER — Ambulatory Visit: Payer: Self-pay | Admitting: Cardiovascular Disease

## 2024-06-19 ENCOUNTER — Ambulatory Visit (HOSPITAL_COMMUNITY)
Admission: RE | Admit: 2024-06-19 | Discharge: 2024-06-19 | Disposition: A | Discharge status: Home / Self Care | Source: Ambulatory Visit | Attending: Cardiology | Admitting: Cardiology

## 2024-06-19 DIAGNOSIS — I351 Nonrheumatic aortic (valve) insufficiency: Secondary | ICD-10-CM | POA: Diagnosis not present

## 2024-06-19 DIAGNOSIS — I350 Nonrheumatic aortic (valve) stenosis: Secondary | ICD-10-CM | POA: Diagnosis not present

## 2024-06-19 LAB — ECHOCARDIOGRAM COMPLETE
AR max vel: 1.82 cm2
AV Area VTI: 1.74 cm2
AV Area mean vel: 1.82 cm2
AV Mean grad: 12 mmHg
AV Peak grad: 19 mmHg
Ao pk vel: 2.18 m/s
Area-P 1/2: 4.06 cm2
P 1/2 time: 352 ms
S' Lateral: 2.5 cm

## 2024-06-19 MED ORDER — PERFLUTREN LIPID MICROSPHERE: 1.0000 mL | INTRAVENOUS | Status: AC | PRN

## 2024-08-18 ENCOUNTER — Ambulatory Visit: Payer: Medicare Other

## 2024-08-19 LAB — CUP PACEART REMOTE DEVICE CHECK
Battery Remaining Longevity: 131 mo
Battery Voltage: 3.01 V
Brady Statistic AP VP Percent: 1.26 %
Brady Statistic AP VS Percent: 52.52 %
Brady Statistic AS VP Percent: 0.55 %
Brady Statistic AS VS Percent: 45.67 %
Brady Statistic RA Percent Paced: 54.27 %
Brady Statistic RV Percent Paced: 2.11 %
Date Time Interrogation Session: 20251207210856
Implantable Lead Connection Status: 753985
Implantable Lead Connection Status: 753985
Implantable Lead Implant Date: 20111110
Implantable Lead Implant Date: 20111110
Implantable Lead Location: 753859
Implantable Lead Location: 753860
Implantable Pulse Generator Implant Date: 20220912
Lead Channel Impedance Value: 342 Ohm
Lead Channel Impedance Value: 399 Ohm
Lead Channel Impedance Value: 475 Ohm
Lead Channel Impedance Value: 608 Ohm
Lead Channel Pacing Threshold Amplitude: 0.875 V
Lead Channel Pacing Threshold Amplitude: 1.125 V
Lead Channel Pacing Threshold Pulse Width: 0.4 ms
Lead Channel Pacing Threshold Pulse Width: 0.4 ms
Lead Channel Sensing Intrinsic Amplitude: 1.5 mV
Lead Channel Sensing Intrinsic Amplitude: 1.5 mV
Lead Channel Sensing Intrinsic Amplitude: 3.375 mV
Lead Channel Sensing Intrinsic Amplitude: 3.375 mV
Lead Channel Setting Pacing Amplitude: 1.75 V
Lead Channel Setting Pacing Amplitude: 2.25 V
Lead Channel Setting Pacing Pulse Width: 0.4 ms
Lead Channel Setting Sensing Sensitivity: 1.2 mV
Zone Setting Status: 755011

## 2024-08-20 NOTE — Progress Notes (Signed)
 Remote PPM Transmission

## 2024-09-06 ENCOUNTER — Ambulatory Visit: Payer: Self-pay | Admitting: Cardiovascular Disease

## 2024-09-22 ENCOUNTER — Emergency Department: Admission: EM | Admit: 2024-09-22 | Discharge: 2024-09-22 | Disposition: A | Discharge status: Home / Self Care

## 2024-09-22 ENCOUNTER — Other Ambulatory Visit: Payer: Self-pay

## 2024-09-22 ENCOUNTER — Emergency Department

## 2024-09-22 DIAGNOSIS — I951 Orthostatic hypotension: Secondary | ICD-10-CM | POA: Insufficient documentation

## 2024-09-22 LAB — CBC
HCT: 43.1 % (ref 39.0–52.0)
Hemoglobin: 14.4 g/dL (ref 13.0–17.0)
MCH: 30.4 pg (ref 26.0–34.0)
MCHC: 33.4 g/dL (ref 30.0–36.0)
MCV: 90.9 fL (ref 80.0–100.0)
Platelets: 246 K/uL (ref 150–400)
RBC: 4.74 MIL/uL (ref 4.22–5.81)
RDW: 13.7 % (ref 11.5–15.5)
WBC: 13.6 K/uL — ABNORMAL HIGH (ref 4.0–10.5)
nRBC: 0 % (ref 0.0–0.2)

## 2024-09-22 LAB — BASIC METABOLIC PANEL WITH GFR
Anion gap: 14 (ref 5–15)
BUN: 14 mg/dL (ref 8–23)
CO2: 24 mmol/L (ref 22–32)
Calcium: 8.7 mg/dL — ABNORMAL LOW (ref 8.9–10.3)
Chloride: 94 mmol/L — ABNORMAL LOW (ref 98–111)
Creatinine, Ser: 0.69 mg/dL (ref 0.61–1.24)
GFR, Estimated: >60 mL/min
Glucose, Bld: 134 mg/dL — ABNORMAL HIGH (ref 70–99)
Potassium: 3.8 mmol/L (ref 3.5–5.1)
Sodium: 133 mmol/L — ABNORMAL LOW (ref 135–145)

## 2024-09-22 LAB — RESP PANEL BY RT-PCR (RSV, FLU A&B, COVID)  RVPGX2
Influenza A by PCR: NEGATIVE
Influenza B by PCR: NEGATIVE
Resp Syncytial Virus by PCR: NEGATIVE
SARS Coronavirus 2 by RT PCR: NEGATIVE

## 2024-09-22 MED ORDER — SODIUM CHLORIDE 0.9 % IV BOLUS
1000.0000 mL | Freq: Once | INTRAVENOUS | Status: AC
  Administered 2024-09-22: 1000 mL via INTRAVENOUS

## 2024-09-22 MED ORDER — DIPHENHYDRAMINE HCL 50 MG/ML IJ SOLN
12.5000 mg | Freq: Once | INTRAMUSCULAR | Status: AC
  Administered 2024-09-22: 12.5 mg via INTRAVENOUS
  Filled 2024-09-22: qty 1

## 2024-09-22 MED ORDER — SODIUM CHLORIDE 0.9 % IV BOLUS
500.0000 mL | Freq: Once | INTRAVENOUS | Status: AC
  Administered 2024-09-22: 500 mL via INTRAVENOUS

## 2024-09-22 MED ORDER — METOCLOPRAMIDE HCL 5 MG/ML IJ SOLN
10.0000 mg | Freq: Once | INTRAMUSCULAR | Status: AC
  Administered 2024-09-22: 10 mg via INTRAVENOUS
  Filled 2024-09-22: qty 2

## 2024-09-22 NOTE — ED Notes (Signed)
 Orthostatic vital signs as follows: Lying BP 167/85 HR 111  Sitting BP 162/76 HR 109  Standing BP 139/71 HR 107

## 2024-09-22 NOTE — ED Provider Notes (Signed)
 "  [facility] Provider Note    Event Date/Time   First MD Initiated Contact with Patient 09/22/24 1138     (approximate)   History   Fall   HPI  [name] [name] is a 79 y.o. male presenting with concern of increased weakness lightheadedness and frequent falls over the last 2 weeks.  States that was having some sinus congestion and rhinorrhea initially he was given some saline spray and this improved since then however he has been having gradually increasing weakness, describes lightheadedness whenever he tries to stand up and feeling near syncopal at times.  He states that he has fallen but has not had any trauma to the head and no loss of consciousness.  Son who is with him at bedside is concerned as he may not be drinking enough water.  Patient states that he does have a severe headache, denying any neck pain, and has not had any fevers, no abdominal pain nausea vomiting chest pain or other symptoms associated with this.  He was going to go to his [facility] but they were not able to see him so decided to come into the emergency department for further assessment.  Has not had any increased swelling in his lower extremities, no other complaints at this time no known sick contacts.     Physical Exam   Triage Vital Signs: ED Triage Vitals  Encounter Vitals Group     BP 09/22/24 1034 135/79     Girls Systolic BP Percentile --      Girls Diastolic BP Percentile --      Boys Systolic BP Percentile --      Boys Diastolic BP Percentile --      Pulse Rate 09/22/24 1034 (!) 104     Resp 09/22/24 1034 18     Temp 09/22/24 1034 98.6 F (37 C)     Temp Source 09/22/24 1034 Oral     SpO2 09/22/24 1034 98 %     Weight 09/22/24 1034 194 lb (88 kg)     Height 09/22/24 1034 5' 11 (1.803 m)     Head Circumference --      Peak Flow --      Pain Score 09/22/24 1042 0     Pain Loc --      Pain Education --      Exclude from Growth Chart --     Most recent vital  signs: Vitals:   09/22/24 1256 09/22/24 1403  BP: 139/71 136/66  Pulse: (!) 107 68  Resp: 18 18  Temp:    SpO2: 98% 98%     General: Awake, no distress.  CV:  Good peripheral perfusion.  Resp:  Normal effort.  Abd:  No distention.  Soft nontender Neuro:  Alert oriented x 3, cranial nerves II through XII are intact, normal speech, appropriate sensation strength coordination in all extremities Other:     ED Results / Procedures / Treatments   Labs (all labs ordered are listed, but only abnormal results are displayed) Labs Reviewed  CBC - Abnormal; Notable for the following components:      Result Value   WBC 13.6 (*)    All other components within normal limits  BASIC METABOLIC PANEL WITH GFR - Abnormal; Notable for the following components:   Sodium 133 (*)    Chloride 94 (*)    Glucose, Bld 134 (*)    Calcium  8.7 (*)    All other components within normal limits  RESP PANEL BY RT-PCR (RSV, FLU A&B, COVID)  [id number]     EKG  My independent interpretation, appears to be an irregularly irregular rhythm consistent with atrial fibrillation, rate of about 115, axis of -90, there is a right bundle branch block present, remaining intervals appear to be within normal limits, no obvious ischemia appreciated on this EKG   RADIOLOGY   PROCEDURES:  Critical Care performed: No  Procedures   MEDICATIONS ORDERED IN ED: Medications  sodium chloride  0.9 % bolus 1,000 mL (0 mLs Intravenous Stopped 09/22/24 1452)  metoCLOPramide  (REGLAN ) injection 10 mg (10 mg Intravenous Given 09/22/24 1245)  diphenhydrAMINE  (BENADRYL ) injection 12.5 mg (12.5 mg Intravenous Given 09/22/24 1243)  sodium chloride  0.9 % bolus 500 mL (500 mLs Intravenous New Bag/Given 09/22/24 1451)     IMPRESSION / MDM / ASSESSMENT AND PLAN / ED COURSE  I reviewed the triage vital signs and the nursing notes.                               Patient's presentation is most consistent with acute presentation with  potential threat to life or bodily function.  79 year old male presenting today with concern of lightheadedness, frequent falls, and headache.  He appears well he is not in any acute distress he does not have any gross neurodeficits.  I wonder if he may be orthostatic at this time possibly in the setting of recent URI which has resulted in dehydration.  Fortunately his blood work here so far appears reassuring slight hyponatremia.  Will provide fluid resuscitation obtain CT imaging follow-up respiratory panel.  He is slightly tachycardic here A-fib.  Will continue to monitor this and ensure appropriate rate control.   Clinical Course as of 09/22/24 1500  Mon Sep 22, 2024  1305 Patient is severely orthostatic, will continue with fluid resuscitation and reassess symptoms afterwards. [SK]  1459 Patient was able to ambulate, his symptoms have significantly improved at this time, CT imaging without acute disease, we will have him ambulated again after some further fluid resuscitation, and I anticipate likely plan for discharge home afterwards. [SK]    Clinical Course User Index [SK] Fernand Rossie HERO, MD     FINAL CLINICAL IMPRESSION(S) / ED DIAGNOSES   Final diagnoses:  Orthostatic hypotension     Rx / DC Orders   ED Discharge Orders     None        Note:  This document was prepared using Dragon voice recognition software and may include unintentional dictation errors.   Fernand Rossie HERO, MD 09/22/24 1500  "

## 2024-09-22 NOTE — ED Provider Notes (Signed)
 Care assumed of patient from outgoing provider.  See their note for initial history, exam and plan.  Clinical Course as of 09/22/24 1523  Mon Sep 22, 2024  1305 Patient is severely orthostatic, will continue with fluid resuscitation and reassess symptoms afterwards. [SK]  1459 Patient was able to ambulate, his symptoms have significantly improved at this time, CT imaging without acute disease, we will have him ambulated again after some further fluid resuscitation, and I anticipate likely plan for discharge home afterwards. [SK]  1520 Presents for a couple of days of weakness, presents with orthostatic hypotension. Plan to ambulated after another 500 bolus.  If not walking well, will need to re-evaluation.    No obvious lab work abnormalities.  Ct head negative.  Given migraine medications Plan to dc home after 500 bolus.  [SM]    Clinical Course User Index [SK] Fernand Rossie HERO, MD [SM] Suzanne Kirsch, MD  On reevaluation states that he is feeling better.  Able to ambulate to the bathroom with a more stable gait.  He is going to go home with his brother tonight.  His brother has a cane and a walker which he can use.  He is going to increase his oral hydration.  Will follow-up with his primary care doctor at the [facility] for reevaluation and possible physical therapy and Occupational Therapy.  Discussed no driving until he is feeling more stable on his feet.  No questions or concerns at time discharge.   Suzanne Kirsch, MD 09/22/24 [phone]

## 2024-09-22 NOTE — ED Notes (Signed)
 Pt ambulated to the restroom. Reports improved symptoms

## 2024-09-22 NOTE — ED Triage Notes (Signed)
 Pt to ED for generally not feeling well, night sweats, fatigue, h/a, increased falls d/t dizziness since December. Denies hitting head with falls.

## 2024-09-22 NOTE — ED Notes (Signed)
Pt verbalizes understanding of discharge instructions. Opportunity for questioning and answers were provided. Pt discharged from ED to home with brother.    

## 2024-09-22 NOTE — Discharge Instructions (Addendum)
 You were seen today due to concern of lightheadedness and weakness.  I suspect you have severe dehydration causing your symptoms, we have given you adequate amount of fluid, please be sure to continue drinking fluid at home.  If you have any worsening of symptoms such as worsening lightheadedness, falls, or any other symptoms you find concerning please return to the emergency department immediately for further medical management.

## 2024-09-24 ENCOUNTER — Emergency Department (HOSPITAL_COMMUNITY)

## 2024-09-24 ENCOUNTER — Inpatient Hospital Stay (HOSPITAL_COMMUNITY)
Admission: EM | Admit: 2024-09-24 | Discharge: 2024-10-03 | DRG: 314 | Disposition: A | Discharge status: Skilled Nursing Facility | Attending: Internal Medicine | Admitting: Internal Medicine

## 2024-09-24 ENCOUNTER — Encounter (HOSPITAL_COMMUNITY): Payer: Self-pay | Admitting: Emergency Medicine

## 2024-09-24 DIAGNOSIS — Z8546 Personal history of malignant neoplasm of prostate: Secondary | ICD-10-CM

## 2024-09-24 DIAGNOSIS — Z952 Presence of prosthetic heart valve: Secondary | ICD-10-CM

## 2024-09-24 DIAGNOSIS — I5032 Chronic diastolic (congestive) heart failure: Secondary | ICD-10-CM | POA: Diagnosis present

## 2024-09-24 DIAGNOSIS — Z1152 Encounter for screening for COVID-19: Secondary | ICD-10-CM

## 2024-09-24 DIAGNOSIS — B952 Enterococcus as the cause of diseases classified elsewhere: Secondary | ICD-10-CM | POA: Diagnosis present

## 2024-09-24 DIAGNOSIS — R7881 Bacteremia: Secondary | ICD-10-CM | POA: Diagnosis present

## 2024-09-24 DIAGNOSIS — M47814 Spondylosis without myelopathy or radiculopathy, thoracic region: Secondary | ICD-10-CM | POA: Diagnosis present

## 2024-09-24 DIAGNOSIS — E11319 Type 2 diabetes mellitus with unspecified diabetic retinopathy without macular edema: Secondary | ICD-10-CM | POA: Diagnosis present

## 2024-09-24 DIAGNOSIS — E119 Type 2 diabetes mellitus without complications: Secondary | ICD-10-CM

## 2024-09-24 DIAGNOSIS — I76 Septic arterial embolism: Secondary | ICD-10-CM | POA: Diagnosis present

## 2024-09-24 DIAGNOSIS — I2510 Atherosclerotic heart disease of native coronary artery without angina pectoris: Secondary | ICD-10-CM | POA: Diagnosis present

## 2024-09-24 DIAGNOSIS — Y831 Surgical operation with implant of artificial internal device as the cause of abnormal reaction of the patient, or of later complication, without mention of misadventure at the time of the procedure: Secondary | ICD-10-CM | POA: Diagnosis present

## 2024-09-24 DIAGNOSIS — R1310 Dysphagia, unspecified: Secondary | ICD-10-CM | POA: Diagnosis present

## 2024-09-24 DIAGNOSIS — I10 Essential (primary) hypertension: Secondary | ICD-10-CM | POA: Diagnosis present

## 2024-09-24 DIAGNOSIS — M4804 Spinal stenosis, thoracic region: Secondary | ICD-10-CM | POA: Diagnosis present

## 2024-09-24 DIAGNOSIS — Z8673 Personal history of transient ischemic attack (TIA), and cerebral infarction without residual deficits: Secondary | ICD-10-CM

## 2024-09-24 DIAGNOSIS — Z8679 Personal history of other diseases of the circulatory system: Secondary | ICD-10-CM

## 2024-09-24 DIAGNOSIS — R509 Fever, unspecified: Principal | ICD-10-CM

## 2024-09-24 DIAGNOSIS — T826XXA Infection and inflammatory reaction due to cardiac valve prosthesis, initial encounter: Secondary | ICD-10-CM | POA: Diagnosis present

## 2024-09-24 DIAGNOSIS — E860 Dehydration: Secondary | ICD-10-CM | POA: Diagnosis present

## 2024-09-24 DIAGNOSIS — I350 Nonrheumatic aortic (valve) stenosis: Secondary | ICD-10-CM | POA: Diagnosis present

## 2024-09-24 DIAGNOSIS — I252 Old myocardial infarction: Secondary | ICD-10-CM

## 2024-09-24 DIAGNOSIS — I4430 Unspecified atrioventricular block: Secondary | ICD-10-CM | POA: Diagnosis present

## 2024-09-24 DIAGNOSIS — G8929 Other chronic pain: Secondary | ICD-10-CM | POA: Diagnosis present

## 2024-09-24 DIAGNOSIS — E876 Hypokalemia: Secondary | ICD-10-CM | POA: Diagnosis present

## 2024-09-24 DIAGNOSIS — E782 Mixed hyperlipidemia: Secondary | ICD-10-CM | POA: Diagnosis present

## 2024-09-24 DIAGNOSIS — Z87891 Personal history of nicotine dependence: Secondary | ICD-10-CM

## 2024-09-24 DIAGNOSIS — M48061 Spinal stenosis, lumbar region without neurogenic claudication: Secondary | ICD-10-CM | POA: Diagnosis present

## 2024-09-24 DIAGNOSIS — Z79899 Other long term (current) drug therapy: Secondary | ICD-10-CM

## 2024-09-24 DIAGNOSIS — Z604 Social exclusion and rejection: Secondary | ICD-10-CM | POA: Diagnosis present

## 2024-09-24 DIAGNOSIS — I351 Nonrheumatic aortic (valve) insufficiency: Secondary | ICD-10-CM | POA: Diagnosis present

## 2024-09-24 DIAGNOSIS — I330 Acute and subacute infective endocarditis: Principal | ICD-10-CM | POA: Diagnosis present

## 2024-09-24 DIAGNOSIS — M47816 Spondylosis without myelopathy or radiculopathy, lumbar region: Secondary | ICD-10-CM | POA: Diagnosis present

## 2024-09-24 DIAGNOSIS — Z7984 Long term (current) use of oral hypoglycemic drugs: Secondary | ICD-10-CM

## 2024-09-24 DIAGNOSIS — I951 Orthostatic hypotension: Secondary | ICD-10-CM | POA: Diagnosis present

## 2024-09-24 DIAGNOSIS — N401 Enlarged prostate with lower urinary tract symptoms: Secondary | ICD-10-CM | POA: Diagnosis present

## 2024-09-24 DIAGNOSIS — Z950 Presence of cardiac pacemaker: Secondary | ICD-10-CM

## 2024-09-24 DIAGNOSIS — R32 Unspecified urinary incontinence: Secondary | ICD-10-CM | POA: Diagnosis present

## 2024-09-24 DIAGNOSIS — Z794 Long term (current) use of insulin: Secondary | ICD-10-CM

## 2024-09-24 DIAGNOSIS — I110 Hypertensive heart disease with heart failure: Secondary | ICD-10-CM | POA: Diagnosis present

## 2024-09-24 DIAGNOSIS — I480 Paroxysmal atrial fibrillation: Secondary | ICD-10-CM | POA: Diagnosis present

## 2024-09-24 DIAGNOSIS — K219 Gastro-esophageal reflux disease without esophagitis: Secondary | ICD-10-CM | POA: Diagnosis present

## 2024-09-24 DIAGNOSIS — I6612 Occlusion and stenosis of left anterior cerebral artery: Secondary | ICD-10-CM | POA: Diagnosis present

## 2024-09-24 DIAGNOSIS — A498 Other bacterial infections of unspecified site: Secondary | ICD-10-CM | POA: Diagnosis present

## 2024-09-24 DIAGNOSIS — E871 Hypo-osmolality and hyponatremia: Secondary | ICD-10-CM | POA: Diagnosis present

## 2024-09-24 DIAGNOSIS — E1169 Type 2 diabetes mellitus with other specified complication: Secondary | ICD-10-CM | POA: Diagnosis present

## 2024-09-24 DIAGNOSIS — Z7901 Long term (current) use of anticoagulants: Secondary | ICD-10-CM

## 2024-09-24 DIAGNOSIS — I340 Nonrheumatic mitral (valve) insufficiency: Secondary | ICD-10-CM | POA: Diagnosis present

## 2024-09-24 DIAGNOSIS — T827XXA Infection and inflammatory reaction due to other cardiac and vascular devices, implants and grafts, initial encounter: Principal | ICD-10-CM | POA: Diagnosis present

## 2024-09-24 LAB — I-STAT CHEM 8, ED
BUN: 16 mg/dL (ref 8–23)
Calcium, Ion: 1.05 mmol/L — ABNORMAL LOW (ref 1.15–1.40)
Chloride: 95 mmol/L — ABNORMAL LOW (ref 98–111)
Creatinine, Ser: 0.7 mg/dL (ref 0.61–1.24)
Glucose, Bld: 131 mg/dL — ABNORMAL HIGH (ref 70–99)
HCT: 41 % (ref 39.0–52.0)
Hemoglobin: 13.9 g/dL (ref 13.0–17.0)
Potassium: 3.1 mmol/L — ABNORMAL LOW (ref 3.5–5.1)
Sodium: 133 mmol/L — ABNORMAL LOW (ref 135–145)
TCO2: 24 mmol/L (ref 22–32)

## 2024-09-24 LAB — CBC WITH DIFFERENTIAL/PLATELET
Abs Immature Granulocytes: 0.08 K/uL — ABNORMAL HIGH (ref 0.00–0.07)
Basophils Absolute: 0.1 K/uL (ref 0.0–0.1)
Basophils Relative: 0 %
Eosinophils Absolute: 0 K/uL (ref 0.0–0.5)
Eosinophils Relative: 0 %
HCT: 40.8 % (ref 39.0–52.0)
Hemoglobin: 14 g/dL (ref 13.0–17.0)
Immature Granulocytes: 1 %
Lymphocytes Relative: 7 %
Lymphs Abs: 0.9 K/uL (ref 0.7–4.0)
MCH: 31 pg (ref 26.0–34.0)
MCHC: 34.3 g/dL (ref 30.0–36.0)
MCV: 90.3 fL (ref 80.0–100.0)
Monocytes Absolute: 1.3 K/uL — ABNORMAL HIGH (ref 0.1–1.0)
Monocytes Relative: 10 %
Neutro Abs: 10.6 K/uL — ABNORMAL HIGH (ref 1.7–7.7)
Neutrophils Relative %: 82 %
Platelets: 210 K/uL (ref 150–400)
RBC: 4.52 MIL/uL (ref 4.22–5.81)
RDW: 14 % (ref 11.5–15.5)
WBC: 13 K/uL — ABNORMAL HIGH (ref 4.0–10.5)
nRBC: 0 % (ref 0.0–0.2)

## 2024-09-24 LAB — COMPREHENSIVE METABOLIC PANEL WITH GFR
ALT: 41 U/L (ref 0–44)
AST: 41 U/L (ref 15–41)
Albumin: 3.4 g/dL — ABNORMAL LOW (ref 3.5–5.0)
Alkaline Phosphatase: 89 U/L (ref 38–126)
Anion gap: 14 (ref 5–15)
BUN: 17 mg/dL (ref 8–23)
CO2: 23 mmol/L (ref 22–32)
Calcium: 8.6 mg/dL — ABNORMAL LOW (ref 8.9–10.3)
Chloride: 95 mmol/L — ABNORMAL LOW (ref 98–111)
Creatinine, Ser: 0.76 mg/dL (ref 0.61–1.24)
GFR, Estimated: >60 mL/min
Glucose, Bld: 128 mg/dL — ABNORMAL HIGH (ref 70–99)
Potassium: 3.2 mmol/L — ABNORMAL LOW (ref 3.5–5.1)
Sodium: 132 mmol/L — ABNORMAL LOW (ref 135–145)
Total Bilirubin: 1.2 mg/dL (ref 0.0–1.2)
Total Protein: 6.2 g/dL — ABNORMAL LOW (ref 6.5–8.1)

## 2024-09-24 LAB — URINALYSIS, W/ REFLEX TO CULTURE (INFECTION SUSPECTED)
Bacteria, UA: NONE SEEN
Bilirubin Urine: NEGATIVE
Glucose, UA: >=500 mg/dL — AB
Hgb urine dipstick: NEGATIVE
Ketones, ur: 20 mg/dL — AB
Leukocytes,Ua: NEGATIVE
Nitrite: NEGATIVE
Protein, ur: 30 mg/dL — AB
Specific Gravity, Urine: 1.048 — ABNORMAL HIGH (ref 1.005–1.030)
pH: 5 (ref 5.0–8.0)

## 2024-09-24 LAB — I-STAT CG4 LACTIC ACID, ED: Lactic Acid, Venous: 1.1 mmol/L (ref 0.5–1.9)

## 2024-09-24 LAB — LIPASE, BLOOD: Lipase: 148 U/L — ABNORMAL HIGH (ref 11–51)

## 2024-09-24 MED ORDER — LACTATED RINGERS IV BOLUS
1000.0000 mL | Freq: Once | INTRAVENOUS | Status: AC
  Administered 2024-09-24: 1000 mL via INTRAVENOUS

## 2024-09-24 MED ORDER — ACETAMINOPHEN 500 MG PO TABS: 1000.0000 mg | ORAL_TABLET | Freq: Four times a day (QID) | ORAL | Status: DC | PRN

## 2024-09-24 MED ORDER — IOHEXOL 350 MG/ML SOLN
75.0000 mL | Freq: Once | INTRAVENOUS | Status: AC | PRN
  Administered 2024-09-24: 75 mL via INTRAVENOUS

## 2024-09-24 MED ORDER — OXYCODONE HCL 5 MG PO TABS
5.0000 mg | ORAL_TABLET | Freq: Four times a day (QID) | ORAL | Status: DC | PRN
  Administered 2024-09-25 – 2024-09-29 (×8): 5 mg via ORAL
  Filled 2024-09-24 (×8): qty 1

## 2024-09-24 MED ORDER — POTASSIUM CHLORIDE 10 MEQ/100ML IV SOLN
10.0000 meq | INTRAVENOUS | Status: AC
  Administered 2024-09-25: 10 meq via INTRAVENOUS
  Filled 2024-09-24: qty 100

## 2024-09-24 NOTE — ED Provider Triage Note (Signed)
 Emergency Medicine Provider Triage Evaluation Note  [name] [name] , a 79 y.o. male  was evaluated in triage.  Pt complains of back pain and fevers for the past 3 to 4 days.  Brother at bedside notes patient has been weak since 12/28.  He went to [facility] and was told he was dehydrated.  States still feeling weak but started to have fevers a few days ago.  Notes fever of 101-102 every afternoon.  Back pain is getting worse as well.  Denies fall or injury.  Pain does not radiate.  Denies urinary symptoms.  Review of Systems  Positive: Back pain, fever, diarrhea Negative: Headache, cough, abdominal pain, vomiting, dysuria or hematuria  Physical Exam  BP (!) 139/59 (BP Location: Right Arm)   Pulse 92   Temp 98.4 F (36.9 C)   Resp 16   SpO2 95%  Gen:   Awake, no distress   Resp:  Normal effort  MSK:   Moves extremities without difficulty  Other:    Medical Decision Making  Medically screening exam initiated at 7:53 PM.  Appropriate orders placed.  Yiannis Borkenhagen was informed that the remainder of the evaluation will be completed by another provider, this initial triage assessment does not replace that evaluation, and the importance of remaining in the ED until their evaluation is complete.     Neysa Thersia RAMAN, [place] 09/24/24 1954

## 2024-09-24 NOTE — ED Notes (Signed)
 Urine cup given and pt to work on sample.

## 2024-09-24 NOTE — ED Notes (Signed)
 Pt attempted to give urine sample, no success. Urine cup with pt

## 2024-09-24 NOTE — ED Triage Notes (Signed)
 Pt complaining of feeling bad since December the 28th. Has been having fatigue, fever, some foggy headedness. Has taken tylenol  but it does not break the fever.

## 2024-09-24 NOTE — ED Provider Notes (Signed)
 " [place] [facility] Provider Note   CSN: [id number] Arrival date & time: 09/24/24  8082     History Chief Complaint  Patient presents with   Fever    HPI [name] [name] is a 79 y.o. male presenting for chief complaint of fever/fatigue and weakness. States that this all starts back in December 28th. He had a fever and was confused x4 days but tried to manage at home. Got better then 2 days ago he started having fevers of 102. He got tylenol  and got better. He was seen 2x days ago for similar symptoms.  Fever and worsening back pain. History of multiple spinal surgeries.   Patient's recorded medical, surgical, social, medication list and allergies were reviewed in the Snapshot window as part of the initial history.   Review of Systems   Review of Systems  Constitutional:  Positive for fever. Negative for chills.  HENT:  Negative for ear pain and sore throat.   Eyes:  Negative for pain and visual disturbance.  Respiratory:  Negative for cough and shortness of breath.   Cardiovascular:  Negative for chest pain and palpitations.  Gastrointestinal:  Positive for abdominal pain. Negative for abdominal distention and vomiting.  Genitourinary:  Negative for dysuria and hematuria.  Musculoskeletal:  Negative for arthralgias and back pain.  Skin:  Negative for color change and rash.  Neurological:  Negative for seizures and syncope.  All other systems reviewed and are negative.   Physical Exam Updated Vital Signs BP (!) 152/68 (BP Location: Right Arm)   Pulse 94   Temp 98.6 F (37 C) (Oral)   Resp 20   SpO2 99%  Physical Exam Vitals and nursing note reviewed.  Constitutional:      General: He is not in acute distress.    Appearance: He is well-developed.  HENT:     Head: Normocephalic and atraumatic.  Eyes:     Conjunctiva/sclera: Conjunctivae normal.  Cardiovascular:     Rate and Rhythm: Normal rate and regular rhythm.     Heart  sounds: No murmur heard. Pulmonary:     Effort: Pulmonary effort is normal. No respiratory distress.     Breath sounds: Normal breath sounds.  Abdominal:     Palpations: Abdomen is soft.     Tenderness: There is no abdominal tenderness.  Musculoskeletal:        General: No swelling.     Cervical back: Neck supple.  Skin:    General: Skin is warm and dry.     Capillary Refill: Capillary refill takes less than 2 seconds.  Neurological:     Mental Status: He is alert.  Psychiatric:        Mood and Affect: Mood normal.      ED Course/ Medical Decision Making/ A&P Clinical Course as of 09/25/24 0646  Wed Sep 24, 2024  2343 CT ABDOMEN PELVIS W CONTRAST [CC]    Clinical Course User Index [CC] Jerral Meth, MD    Procedures .Critical Care  Performed by: Jerral Meth, MD Authorized by: Jerral Meth, MD   Critical care provider statement:    Critical care time (minutes):  30   Critical care was necessary to treat or prevent imminent or life-threatening deterioration of the following conditions:  Sepsis   Critical care was time spent personally by me on the following activities:  Development of treatment plan with patient or surrogate, discussions with consultants, evaluation of patient's response to treatment, examination of patient, ordering  and review of laboratory studies, ordering and review of radiographic studies, ordering and performing treatments and interventions, pulse oximetry, re-evaluation of patient's condition and review of old charts   Care discussed with: admitting provider      Medications Ordered in ED Medications  potassium chloride  10 mEq in 100 mL IVPB (0 mEq Intravenous Stopped 09/25/24 0212)  oxyCODONE  (Oxy IR/ROXICODONE ) immediate release tablet 5 mg (has no administration in time range)  vancomycin  (VANCOREADY) IVPB 2000 mg/400 mL (2,000 mg Intravenous New Bag/Given 09/25/24 0500)  potassium chloride  10 mEq in 100 mL IVPB (10 mEq Intravenous  New Bag/Given 09/25/24 0643)  atorvastatin  (LIPITOR) tablet 40 mg (has no administration in time range)  metoprolol  tartrate (LOPRESSOR ) tablet 50 mg (has no administration in time range)  empagliflozin  (JARDIANCE ) tablet 10 mg (has no administration in time range)  apixaban  (ELIQUIS ) tablet 5 mg (5 mg Oral Given 09/25/24 9361)  sodium chloride  flush (NS) 0.9 % injection 3 mL (3 mLs Intravenous Not Given 09/25/24 0259)  sodium chloride  flush (NS) 0.9 % injection 3 mL (3 mLs Intravenous Given 09/25/24 0300)  sodium chloride  flush (NS) 0.9 % injection 3 mL (has no administration in time range)  0.9 %  sodium chloride  infusion (has no administration in time range)  trimethobenzamide  (TIGAN ) injection 200 mg (has no administration in time range)  insulin  aspart (novoLOG ) injection 0-6 Units (has no administration in time range)  acetaminophen  (TYLENOL ) tablet 650 mg (has no administration in time range)  vancomycin  (VANCOCIN ) IVPB 1000 mg/200 mL premix (has no administration in time range)  piperacillin -tazobactam (ZOSYN ) IVPB 3.375 g (has no administration in time range)  iohexol  (OMNIPAQUE ) 350 MG/ML injection 75 mL (75 mLs Intravenous Contrast Given 09/24/24 2135)  lactated ringers  bolus 1,000 mL (1,000 mLs Intravenous New Bag/Given 09/24/24 2323)  piperacillin -tazobactam (ZOSYN ) IVPB 3.375 g (0 g Intravenous Stopped 09/25/24 9662)    Medical Decision Making:   79 year old male presenting with a chief complaint of fever for 3 weeks. Relatively well gentleman at baseline but has had pretty severe decompensation over the past 3 weeks.  Family states that he has gone from completely independent to relying on them for care.  He has had to move in with the brother.  Additionally he has been taking Tylenol  daily for fevers of 102 and higher for 3 weeks straight.  He is also developed severe back pain and disability with ambulation as well as lower extremity weakness that is been progressive.  This is all new  over the past week.  He does have some chronic back pain but pretty substantial worsening this week alone. Reviewed his history of present illness and physical exam findings raise concern for developing osteomyelitis given surgical history in his back.  UTI in the differential although urine looks benign.  Pulmonary etiology possible though x-ray looks benign.  Abdominal etiology given nonspecific abdominal pain in the differential although CT abdomen pelvis shows no focal etiology for the symptoms.  This leaves the osteomyelitis/discitis is the more likely diagnosis.  Clinically patient does not have a cauda equina syndrome so epidural abscess is considered mildly less likely though MRI will also be differentiating in this diagnosis. Patient was started on broad-spectrum antibiotics for suspected bacteremia and MRIs were ordered.  Patient admitted to medicine pending these MRIs.  Clinical Impression:  1. Fever, unspecified fever cause      Admit   Final Clinical Impression(s) / ED Diagnoses Final diagnoses:  Fever, unspecified fever cause    Rx / DC  Orders ED Discharge Orders     None         Jerral Meth, MD 09/25/24 [phone]  "

## 2024-09-24 NOTE — ED Notes (Signed)
 Low back pain radiating into kidneys and increased confusion. Fevers daily.

## 2024-09-25 ENCOUNTER — Inpatient Hospital Stay (HOSPITAL_COMMUNITY)

## 2024-09-25 ENCOUNTER — Other Ambulatory Visit: Payer: Self-pay

## 2024-09-25 ENCOUNTER — Encounter (HOSPITAL_COMMUNITY): Payer: Self-pay | Admitting: Internal Medicine

## 2024-09-25 DIAGNOSIS — Z952 Presence of prosthetic heart valve: Secondary | ICD-10-CM

## 2024-09-25 DIAGNOSIS — T827XXA Infection and inflammatory reaction due to other cardiac and vascular devices, implants and grafts, initial encounter: Secondary | ICD-10-CM | POA: Diagnosis present

## 2024-09-25 DIAGNOSIS — Z950 Presence of cardiac pacemaker: Secondary | ICD-10-CM

## 2024-09-25 DIAGNOSIS — I69398 Other sequelae of cerebral infarction: Secondary | ICD-10-CM | POA: Diagnosis not present

## 2024-09-25 DIAGNOSIS — I110 Hypertensive heart disease with heart failure: Secondary | ICD-10-CM | POA: Diagnosis present

## 2024-09-25 DIAGNOSIS — Z7901 Long term (current) use of anticoagulants: Secondary | ICD-10-CM | POA: Diagnosis not present

## 2024-09-25 DIAGNOSIS — I63441 Cerebral infarction due to embolism of right cerebellar artery: Secondary | ICD-10-CM | POA: Diagnosis not present

## 2024-09-25 DIAGNOSIS — I38 Endocarditis, valve unspecified: Secondary | ICD-10-CM | POA: Diagnosis not present

## 2024-09-25 DIAGNOSIS — E119 Type 2 diabetes mellitus without complications: Secondary | ICD-10-CM

## 2024-09-25 DIAGNOSIS — I2510 Atherosclerotic heart disease of native coronary artery without angina pectoris: Secondary | ICD-10-CM | POA: Diagnosis present

## 2024-09-25 DIAGNOSIS — T827XXD Infection and inflammatory reaction due to other cardiac and vascular devices, implants and grafts, subsequent encounter: Secondary | ICD-10-CM | POA: Diagnosis not present

## 2024-09-25 DIAGNOSIS — I350 Nonrheumatic aortic (valve) stenosis: Secondary | ICD-10-CM

## 2024-09-25 DIAGNOSIS — T826XXD Infection and inflammatory reaction due to cardiac valve prosthesis, subsequent encounter: Secondary | ICD-10-CM | POA: Diagnosis not present

## 2024-09-25 DIAGNOSIS — I6340 Cerebral infarction due to embolism of unspecified cerebral artery: Secondary | ICD-10-CM | POA: Diagnosis not present

## 2024-09-25 DIAGNOSIS — Z8546 Personal history of malignant neoplasm of prostate: Secondary | ICD-10-CM | POA: Diagnosis not present

## 2024-09-25 DIAGNOSIS — E876 Hypokalemia: Secondary | ICD-10-CM | POA: Insufficient documentation

## 2024-09-25 DIAGNOSIS — I69391 Dysphagia following cerebral infarction: Secondary | ICD-10-CM | POA: Diagnosis not present

## 2024-09-25 DIAGNOSIS — E1169 Type 2 diabetes mellitus with other specified complication: Secondary | ICD-10-CM | POA: Diagnosis present

## 2024-09-25 DIAGNOSIS — R7881 Bacteremia: Secondary | ICD-10-CM | POA: Diagnosis present

## 2024-09-25 DIAGNOSIS — I5032 Chronic diastolic (congestive) heart failure: Secondary | ICD-10-CM | POA: Diagnosis present

## 2024-09-25 DIAGNOSIS — E860 Dehydration: Secondary | ICD-10-CM | POA: Diagnosis present

## 2024-09-25 DIAGNOSIS — Z794 Long term (current) use of insulin: Secondary | ICD-10-CM | POA: Diagnosis not present

## 2024-09-25 DIAGNOSIS — I10 Essential (primary) hypertension: Secondary | ICD-10-CM

## 2024-09-25 DIAGNOSIS — I6612 Occlusion and stenosis of left anterior cerebral artery: Secondary | ICD-10-CM | POA: Diagnosis present

## 2024-09-25 DIAGNOSIS — R509 Fever, unspecified: Secondary | ICD-10-CM

## 2024-09-25 DIAGNOSIS — Y831 Surgical operation with implant of artificial internal device as the cause of abnormal reaction of the patient, or of later complication, without mention of misadventure at the time of the procedure: Secondary | ICD-10-CM | POA: Diagnosis present

## 2024-09-25 DIAGNOSIS — E871 Hypo-osmolality and hyponatremia: Secondary | ICD-10-CM | POA: Diagnosis present

## 2024-09-25 DIAGNOSIS — M4804 Spinal stenosis, thoracic region: Secondary | ICD-10-CM | POA: Diagnosis present

## 2024-09-25 DIAGNOSIS — Z87891 Personal history of nicotine dependence: Secondary | ICD-10-CM | POA: Diagnosis not present

## 2024-09-25 DIAGNOSIS — R29701 NIHSS score 1: Secondary | ICD-10-CM | POA: Diagnosis not present

## 2024-09-25 DIAGNOSIS — I480 Paroxysmal atrial fibrillation: Secondary | ICD-10-CM | POA: Diagnosis present

## 2024-09-25 DIAGNOSIS — I76 Septic arterial embolism: Secondary | ICD-10-CM | POA: Diagnosis present

## 2024-09-25 DIAGNOSIS — E782 Mixed hyperlipidemia: Secondary | ICD-10-CM | POA: Diagnosis present

## 2024-09-25 DIAGNOSIS — E785 Hyperlipidemia, unspecified: Secondary | ICD-10-CM | POA: Diagnosis not present

## 2024-09-25 DIAGNOSIS — A498 Other bacterial infections of unspecified site: Secondary | ICD-10-CM | POA: Diagnosis not present

## 2024-09-25 DIAGNOSIS — I951 Orthostatic hypotension: Secondary | ICD-10-CM | POA: Diagnosis present

## 2024-09-25 DIAGNOSIS — B952 Enterococcus as the cause of diseases classified elsewhere: Secondary | ICD-10-CM | POA: Diagnosis present

## 2024-09-25 DIAGNOSIS — G8929 Other chronic pain: Secondary | ICD-10-CM | POA: Diagnosis present

## 2024-09-25 DIAGNOSIS — T826XXA Infection and inflammatory reaction due to cardiac valve prosthesis, initial encounter: Secondary | ICD-10-CM | POA: Diagnosis present

## 2024-09-25 DIAGNOSIS — I639 Cerebral infarction, unspecified: Secondary | ICD-10-CM | POA: Diagnosis not present

## 2024-09-25 DIAGNOSIS — Z1152 Encounter for screening for COVID-19: Secondary | ICD-10-CM | POA: Diagnosis not present

## 2024-09-25 DIAGNOSIS — E11319 Type 2 diabetes mellitus with unspecified diabetic retinopathy without macular edema: Secondary | ICD-10-CM | POA: Diagnosis present

## 2024-09-25 DIAGNOSIS — Z8679 Personal history of other diseases of the circulatory system: Secondary | ICD-10-CM | POA: Diagnosis not present

## 2024-09-25 DIAGNOSIS — I330 Acute and subacute infective endocarditis: Secondary | ICD-10-CM | POA: Diagnosis present

## 2024-09-25 DIAGNOSIS — R1310 Dysphagia, unspecified: Secondary | ICD-10-CM | POA: Diagnosis present

## 2024-09-25 LAB — GLUCOSE, CAPILLARY: Glucose-Capillary: 105 mg/dL — ABNORMAL HIGH (ref 70–99)

## 2024-09-25 LAB — HEMOGLOBIN A1C
Hgb A1c MFr Bld: 6.5 % — ABNORMAL HIGH (ref 4.8–5.6)
Mean Plasma Glucose: 139.85 mg/dL

## 2024-09-25 LAB — BASIC METABOLIC PANEL WITH GFR
Anion gap: 15 (ref 5–15)
Anion gap: 15 (ref 5–15)
BUN: 14 mg/dL (ref 8–23)
BUN: 12 mg/dL (ref 8–23)
CO2: 22 mmol/L (ref 22–32)
CO2: 21 mmol/L — ABNORMAL LOW (ref 22–32)
Calcium: 8 mg/dL — ABNORMAL LOW (ref 8.9–10.3)
Calcium: 8.1 mg/dL — ABNORMAL LOW (ref 8.9–10.3)
Chloride: 97 mmol/L — ABNORMAL LOW (ref 98–111)
Chloride: 96 mmol/L — ABNORMAL LOW (ref 98–111)
Creatinine, Ser: 0.61 mg/dL (ref 0.61–1.24)
Creatinine, Ser: 0.65 mg/dL (ref 0.61–1.24)
GFR, Estimated: >60 mL/min
GFR, Estimated: >60 mL/min
Glucose, Bld: 97 mg/dL (ref 70–99)
Glucose, Bld: 89 mg/dL (ref 70–99)
Potassium: 3.4 mmol/L — ABNORMAL LOW (ref 3.5–5.1)
Potassium: 3.7 mmol/L (ref 3.5–5.1)
Sodium: 133 mmol/L — ABNORMAL LOW (ref 135–145)
Sodium: 131 mmol/L — ABNORMAL LOW (ref 135–145)

## 2024-09-25 LAB — ECHOCARDIOGRAM COMPLETE
AR max vel: 1.5 cm2
AV Area VTI: 1.31 cm2
AV Area mean vel: 1.47 cm2
AV Mean grad: 10 mmHg
AV Peak grad: 15.7 mmHg
Ao pk vel: 1.98 m/s
Area-P 1/2: 2.94 cm2
S' Lateral: 2.9 cm
Single Plane A4C EF: 61.1 %

## 2024-09-25 LAB — BLOOD CULTURE ID PANEL (REFLEXED) - BCID2

## 2024-09-25 LAB — RESP PANEL BY RT-PCR (RSV, FLU A&B, COVID)  RVPGX2
Influenza A by PCR: NEGATIVE
Influenza B by PCR: NEGATIVE
Resp Syncytial Virus by PCR: NEGATIVE
SARS Coronavirus 2 by RT PCR: NEGATIVE

## 2024-09-25 LAB — CBG MONITORING, ED
Glucose-Capillary: 74 mg/dL (ref 70–99)
Glucose-Capillary: 114 mg/dL — ABNORMAL HIGH (ref 70–99)
Glucose-Capillary: 109 mg/dL — ABNORMAL HIGH (ref 70–99)

## 2024-09-25 LAB — CBC
HCT: 39.5 % (ref 39.0–52.0)
Hemoglobin: 13.1 g/dL (ref 13.0–17.0)
MCH: 31.2 pg (ref 26.0–34.0)
MCHC: 33.2 g/dL (ref 30.0–36.0)
MCV: 94 fL (ref 80.0–100.0)
Platelets: 180 K/uL (ref 150–400)
RBC: 4.2 MIL/uL — ABNORMAL LOW (ref 4.22–5.81)
RDW: 13.9 % (ref 11.5–15.5)
WBC: 10 K/uL (ref 4.0–10.5)
nRBC: 0 % (ref 0.0–0.2)

## 2024-09-25 LAB — I-STAT CG4 LACTIC ACID, ED: Lactic Acid, Venous: 1.3 mmol/L (ref 0.5–1.9)

## 2024-09-25 LAB — MAGNESIUM: Magnesium: 2.1 mg/dL (ref 1.7–2.4)

## 2024-09-25 MED ORDER — POTASSIUM CHLORIDE 10 MEQ/100ML IV SOLN
10.0000 meq | INTRAVENOUS | Status: AC
  Administered 2024-09-25 (×2): 10 meq via INTRAVENOUS
  Filled 2024-09-25 (×2): qty 100

## 2024-09-25 MED ORDER — ACETAMINOPHEN 325 MG PO TABS
650.0000 mg | ORAL_TABLET | Freq: Four times a day (QID) | ORAL | Status: AC | PRN
  Administered 2024-09-27: 650 mg via ORAL
  Filled 2024-09-25: qty 2

## 2024-09-25 MED ORDER — INSULIN ASPART 100 UNIT/ML IJ SOLN: 0.0000 [IU] | Freq: Three times a day (TID) | INTRAMUSCULAR | Status: DC

## 2024-09-25 MED ORDER — EMPAGLIFLOZIN 10 MG PO TABS
10.0000 mg | ORAL_TABLET | Freq: Every day | ORAL | Status: DC
  Administered 2024-09-25 – 2024-10-03 (×8): 10 mg via ORAL
  Filled 2024-09-25 (×9): qty 1

## 2024-09-25 MED ORDER — APIXABAN 5 MG PO TABS
5.0000 mg | ORAL_TABLET | Freq: Two times a day (BID) | ORAL | Status: DC
  Administered 2024-09-25 – 2024-09-28 (×8): 5 mg via ORAL
  Filled 2024-09-25 (×9): qty 1

## 2024-09-25 MED ORDER — VANCOMYCIN HCL IN DEXTROSE 1-5 GM/200ML-% IV SOLN
1000.0000 mg | Freq: Two times a day (BID) | INTRAVENOUS | Status: DC
  Administered 2024-09-25: 1000 mg via INTRAVENOUS
  Filled 2024-09-25: qty 200

## 2024-09-25 MED ORDER — VANCOMYCIN HCL 2000 MG/400ML IV SOLN
2000.0000 mg | Freq: Once | INTRAVENOUS | Status: AC
  Administered 2024-09-25: 2000 mg via INTRAVENOUS
  Filled 2024-09-25: qty 400

## 2024-09-25 MED ORDER — GADOBUTROL 1 MMOL/ML IV SOLN
10.0000 mL | Freq: Once | INTRAVENOUS | Status: AC | PRN
  Administered 2024-09-25: 10 mL via INTRAVENOUS

## 2024-09-25 MED ORDER — SODIUM CHLORIDE 0.9 % IV SOLN
2.0000 g | Freq: Two times a day (BID) | INTRAVENOUS | Status: DC
  Administered 2024-09-26 – 2024-10-03 (×15): 2 g via INTRAVENOUS
  Filled 2024-09-25 (×15): qty 20

## 2024-09-25 MED ORDER — ACETAMINOPHEN 650 MG RE SUPP: 650.0000 mg | Freq: Four times a day (QID) | RECTAL | Status: DC | PRN

## 2024-09-25 MED ORDER — SODIUM CHLORIDE 0.9 % IV SOLN
2.0000 g | INTRAVENOUS | Status: DC
  Administered 2024-09-26 – 2024-10-03 (×44): 2 g via INTRAVENOUS
  Filled 2024-09-25 (×48): qty 2000

## 2024-09-25 MED ORDER — ATORVASTATIN CALCIUM 40 MG PO TABS
40.0000 mg | ORAL_TABLET | Freq: Every day | ORAL | Status: DC
  Administered 2024-09-25 – 2024-10-03 (×9): 40 mg via ORAL
  Filled 2024-09-25 (×9): qty 1

## 2024-09-25 MED ORDER — PIPERACILLIN-TAZOBACTAM 3.375 G IVPB
3.3750 g | Freq: Three times a day (TID) | INTRAVENOUS | Status: DC
  Administered 2024-09-25 (×3): 3.375 g via INTRAVENOUS
  Filled 2024-09-25 (×3): qty 50

## 2024-09-25 MED ORDER — TRIMETHOBENZAMIDE HCL 100 MG/ML IM SOLN
200.0000 mg | Freq: Four times a day (QID) | INTRAMUSCULAR | Status: DC | PRN
  Administered 2024-09-29 – 2024-09-30 (×3): 200 mg via INTRAMUSCULAR
  Filled 2024-09-25 (×5): qty 2

## 2024-09-25 MED ORDER — SODIUM CHLORIDE 0.9 % IV SOLN: 250.0000 mL | INTRAVENOUS | Status: AC | PRN

## 2024-09-25 MED ORDER — SODIUM CHLORIDE 0.9% FLUSH: 3.0000 mL | INTRAVENOUS | Status: DC | PRN

## 2024-09-25 MED ORDER — ONDANSETRON HCL 4 MG/2ML IJ SOLN: 4.0000 mg | Freq: Four times a day (QID) | INTRAMUSCULAR | Status: DC | PRN

## 2024-09-25 MED ORDER — ACETAMINOPHEN 500 MG PO TABS: 1000.0000 mg | ORAL_TABLET | Freq: Four times a day (QID) | ORAL | Status: DC | PRN

## 2024-09-25 MED ORDER — CALCIUM GLUCONATE-NACL 1-0.675 GM/50ML-% IV SOLN
1.0000 g | Freq: Once | INTRAVENOUS | Status: AC
  Administered 2024-09-25: 1000 mg via INTRAVENOUS
  Filled 2024-09-25: qty 50

## 2024-09-25 MED ORDER — PIPERACILLIN-TAZOBACTAM 3.375 G IVPB 30 MIN
3.3750 g | Freq: Once | INTRAVENOUS | Status: AC
  Administered 2024-09-25: 3.375 g via INTRAVENOUS
  Filled 2024-09-25: qty 50

## 2024-09-25 MED ORDER — ACETAMINOPHEN 325 MG PO TABS: 650.0000 mg | ORAL_TABLET | Freq: Four times a day (QID) | ORAL | Status: DC | PRN

## 2024-09-25 MED ORDER — PERFLUTREN LIPID MICROSPHERE
1.0000 mL | INTRAVENOUS | Status: AC | PRN
  Administered 2024-09-25: 2 mL via INTRAVENOUS

## 2024-09-25 MED ORDER — SODIUM CHLORIDE 0.9% FLUSH
3.0000 mL | Freq: Two times a day (BID) | INTRAVENOUS | Status: DC
  Administered 2024-09-25 – 2024-10-03 (×10): 3 mL via INTRAVENOUS

## 2024-09-25 MED ORDER — METOPROLOL TARTRATE 50 MG PO TABS
50.0000 mg | ORAL_TABLET | Freq: Two times a day (BID) | ORAL | Status: DC
  Administered 2024-09-25 – 2024-10-03 (×17): 50 mg via ORAL
  Filled 2024-09-25 (×14): qty 1
  Filled 2024-09-25: qty 2
  Filled 2024-09-25 (×2): qty 1

## 2024-09-25 MED ORDER — ONDANSETRON HCL 4 MG PO TABS: 4.0000 mg | ORAL_TABLET | Freq: Four times a day (QID) | ORAL | Status: DC | PRN

## 2024-09-25 MED ORDER — SODIUM CHLORIDE 0.9% FLUSH
3.0000 mL | Freq: Two times a day (BID) | INTRAVENOUS | Status: DC
  Administered 2024-09-25 – 2024-10-03 (×5): 3 mL via INTRAVENOUS

## 2024-09-25 NOTE — Progress Notes (Signed)
 Patient arrived to room. Alert and oriented x3. Call bell within reach and bed alarm on. Patient's brother in room. Admission assessment completed. VSS.

## 2024-09-25 NOTE — ED Notes (Signed)
 Patient transported to MRI

## 2024-09-25 NOTE — H&P (Signed)
 " History and Physical    [name] [name] FMW:[id number] DOB: 21-Dec-1945 DOA: 09/24/2024  PCP: [name], MD   Patient coming from: Home   Chief Complaint:  Chief Complaint  Patient presents with   Fever   ED TRIAGE note:  Pt complaining of feeling bad since December the 28th. Has been having fatigue, fever, some foggy headedness. Has taken tylenol  but it does not break the fever.      HPI:  [name] [name] is a 79 y.o. 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  Reported that he was doing better 2 days ago again developed fever temperature up to 102. Patient was seen at [facility] emergency department few days ago found to have orthostatic hypotension and AKI received IV fluid and discharged home.  At the bedside patient's brother stated that at baseline patient is completely independent however due to back pain he is becoming more relying on him for activity of daily living.   Patient also reported history of multiple spinal surgeries and complaining about worsening back pain as well.  ED Course:  At presentation to ED patient is hemodynamically stable.  Afebrile. Lab, CBC showed leukocytosis 13 otherwise unremarkable.  CMP showed low sodium 132, low potassium 3.2 otherwise unremarkable.  Normal hepatic function panel elevated lipase 148.  Normal lactic acid level.  Blood cultures are in process.  Negative respiratory panel.  UA no evidence of UTI. Chest x-ray no acute cardiopulmonary process or pneumonia.  As patient is complaining about back pain in the ED MRI lumbar  and thoracic spine has been ordered.  Patient also received IV vancomycin  and Zosyn .  Hospitalist consulted for further evaluation management of fever of unknown origin.   Significant labs in the ED: Lab Orders         Resp panel by RT-PCR (RSV, Flu A&B, Covid) Anterior Nasal Swab         Blood culture (routine x 2)         Comprehensive metabolic panel         CBC with Differential         Lipase, blood         Urinalysis, w/ Reflex to Culture (Infection Suspected) -Urine, Clean Catch         CBC         Basic metabolic panel         Magnesium         Hemoglobin A1c         I-Stat Lactic Acid         I-stat chem 8, ED       Review of Systems:  Review of Systems  Constitutional:  Positive for fever and malaise/fatigue. Negative for chills.  Respiratory:  Negative for cough, sputum production and shortness of breath.   Cardiovascular:  Negative for chest pain, palpitations and leg swelling.  Gastrointestinal:  Negative for abdominal pain, constipation, diarrhea, heartburn, nausea and vomiting.  Genitourinary:  Negative for dysuria, frequency and urgency.  Musculoskeletal:  Positive for back pain. Negative for falls, joint pain, myalgias and neck pain.  Neurological:  Negative for dizziness and headaches.  Psychiatric/Behavioral:  The patient  is not nervous/anxious.     Past Medical History:  Diagnosis Date   Anticoagulant long-term use    eliquis --- managed by cardiology   Aortic valve insufficiency    cardiologist--- dr francyne;   01/ 2012  s/p AVR for severe AS , bicusipd;  last echo 12-25-2019  mild AR no stenosis, mean grandiant   BPH associated with nocturia    Coronary artery disease    cardiologist--- dr croitoru;   nuclear stress test 02-28-2007  normal perfusion no ischemia, nuclear ef 58%;   cardiac cath 06-30-2010  severe AS and nonobstructive disease involving LAD/ CFx/ RCA   Heart murmur    History of non-ST elevation myocardial infarction (NSTEMI)  06/2010   History of ventricular tachycardia 06/2010   nonsustained VT in setting NSTEMI; severe AS   Hypertension    Malignant neoplasm prostate (HCC) 07/2021   urologist--- dr gay:  dx 11/ 2022 Gleason 4+3, PSA 11.9   Mitral insufficiency    per echo 12-25-2019  mile regurg,  no stenosis, degenerative   Mixed dyslipidemia    Mobitz type 1 second degree AV block 06/2010   symptomatic syncope;  s/p PPM   Nonproliferative retinopathy of both eyes due to type 2 diabetes mellitus (HCC)    mild without edema   PAF (paroxysmal atrial fibrillation) (HCC)    followed by cardiology---- post op afib 01/ 2012   Presence of permanent cardiac pacemaker 07/21/2010   medtronic;  PPM dual chamber;  generator change 05-23-2021  medtronic azure xt device   RBBB (right bundle branch block with left anterior fascicular block)    Tinnitus of both ears    Type 2 diabetes mellitus [facility])    endocrinologist-- dr d. patel   (01-13-2022  pt stated only checks sugar occasionally   Wears glasses     Past Surgical History:  Procedure Laterality Date   AORTIC VALVE REPLACEMENT  10/06/2010   @[facility]   by dr emeterio Celestia Staple Ease prosthesis   CARDIAC CATHETERIZATION  06/30/2010   @[facility]   ;   nonobstructive CAD, heavily ca+ AOV   GOLD SEED IMPLANT N/A 01/17/2022   Procedure: GOLD SEED IMPLANT;  Surgeon: Selma Donnice SAUNDERS, MD;  Location: [facility];  Service: Urology;  Laterality: N/A;  30 MINS   LUMBAR DISC SURGERY     yrs ago   PERMANENT PACEMAKER INSERTION  07/21/2010   @[facility]  by dr croitoru;  Medtronic   PPM GENERATOR CHANGEOUT N/A 05/23/2021   Procedure: PPM GENERATOR CHANGEOUT;  Surgeon: Francyne Headland, MD;  Location: [facility] INVASIVE CV [facility];  Service: Cardiovascular;  Laterality: N/A;   SPACE OAR INSTILLATION N/A 01/17/2022   Procedure: SPACE OAR INSTILLATION;  Surgeon: Selma Donnice SAUNDERS, MD;  Location: [facility];  Service: Urology;  Laterality: N/A;     reports that he quit smoking  about 31 years ago. His smoking use included cigarettes. He started smoking about 51 years ago. He quit smokeless tobacco use about 31 years ago.  His smokeless tobacco use included chew. He reports that he does not drink alcohol and does not use drugs.  Allergies[1]  History reviewed. No pertinent family history.  Prior to Admission medications  Medication Sig Start Date End Date Taking? Authorizing Provider  amLODipine  (NORVASC ) 5 MG tablet Take 1 tablet (5 mg total) by mouth daily. Patient taking differently: Take 5 mg by mouth daily. 11/14/21   Croitoru, Mihai, MD  apixaban  (ELIQUIS ) 5 MG TABS tablet TAKE 1  TABLET TWICE A DAY 06/06/24   Croitoru, Mihai, MD  atorvastatin  (LIPITOR) 40 MG tablet Take 1 tablet by mouth daily. 11/16/22   [provider]  empagliflozin  (JARDIANCE ) 25 MG TABS tablet Take 12.5 mg by mouth daily. 11/08/23   [provider]  irbesartan  (AVAPRO ) 300 MG tablet Take 300 mg by mouth daily. 04/29/19   [provider]  metFORMIN (GLUCOPHAGE) 500 MG tablet Take 0.5 tablets by mouth 2 (two) times daily. 11/16/22   [provider]  metoprolol  tartrate (LOPRESSOR ) 50 MG tablet TAKE 1 TABLET TWICE A DAY 05/21/24   Croitoru, Jerel, MD     Physical Exam: Vitals:   09/24/24 1923 09/24/24 2306 09/24/24 2308  BP: (!) 139/59 (!) 129/112   Pulse: 92 85   Resp: 16 18   Temp: 98.4 F (36.9 C)  98.3 F (36.8 C)  SpO2: 95% 98%     Physical Exam Vitals and nursing note reviewed.  Constitutional:      Appearance: He is ill-appearing.  HENT:     Mouth/Throat:     Mouth: Mucous membranes are moist.  Eyes:     Pupils: Pupils are equal, round, and reactive to light.  Cardiovascular:     Rate and Rhythm: Normal rate. Rhythm irregular.     Pulses: Normal pulses.     Heart sounds: Normal heart sounds.  Pulmonary:     Effort: Pulmonary effort is normal.     Breath sounds: Normal breath sounds.  Abdominal:     Palpations: Abdomen is soft.   Musculoskeletal:     Cervical back: Neck supple.     Right lower leg: No edema.     Left lower leg: No edema.  Skin:    Capillary Refill: Capillary refill takes less than 2 seconds.  Neurological:     Mental Status: He is alert and oriented to person, place, and time.  Psychiatric:        Mood and Affect: Mood normal.      Labs on Admission: I have personally reviewed following labs and imaging studies  CBC: Recent Labs  Lab 09/22/24 1044 09/24/24 1959 09/24/24 2003 09/25/24 0220  WBC 13.6* 13.0*  --  10.0  NEUTROABS  --  10.6*  --   --   HGB 14.4 14.0 13.9 13.1  HCT 43.1 40.8 41.0 39.5  MCV 90.9 90.3  --  94.0  PLT 246 210  --  180   Basic Metabolic Panel: Recent Labs  Lab 09/22/24 1044 09/24/24 1959 09/24/24 2003  NA 133* 132* 133*  K 3.8 3.2* 3.1*  CL 94* 95* 95*  CO2 24 23  --   GLUCOSE 134* 128* 131*  BUN 14 17 16   CREATININE 0.69 0.76 0.70  CALCIUM  8.7* 8.6*  --    GFR: Estimated Creatinine Clearance: 79.7 mL/min (by C-G formula based on SCr of 0.7 mg/dL). Liver Function Tests: Recent Labs  Lab 09/24/24 1959  AST 41  ALT 41  ALKPHOS 89  BILITOT 1.2  PROT 6.2*  ALBUMIN 3.4*   Recent Labs  Lab 09/24/24 1959  LIPASE 148*   No results for input(s): AMMONIA in the last 168 hours. Coagulation Profile: No results for input(s): INR, PROTIME in the last 168 hours. Cardiac Enzymes: No results for input(s): CKTOTAL, CKMB, CKMBINDEX, TROPONINI, TROPONINIHS in the last 168 hours. BNP (last 3 results) No results for input(s): BNP in the last 8760 hours. HbA1C: No results for input(s): HGBA1C in the last 72 hours. CBG: No  results for input(s): GLUCAP in the last 168 hours. Lipid Profile: No results for input(s): CHOL, HDL, LDLCALC, TRIG, CHOLHDL, LDLDIRECT in the last 72 hours. Thyroid Function Tests: No results for input(s): TSH, T4TOTAL, FREET4, T3FREE, THYROIDAB in the last 72 hours. Anemia Panel: No  results for input(s): VITAMINB12, FOLATE, FERRITIN, TIBC, IRON, RETICCTPCT in the last 72 hours. Urine analysis:    Component Value Date/Time   COLORURINE YELLOW 09/24/2024 2230   APPEARANCEUR CLEAR 09/24/2024 2230   LABSPEC 1.048 (H) 09/24/2024 2230   PHURINE 5.0 09/24/2024 2230   GLUCOSEU >=500 (A) 09/24/2024 2230   HGBUR NEGATIVE 09/24/2024 2230   BILIRUBINUR NEGATIVE 09/24/2024 2230   KETONESUR 20 (A) 09/24/2024 2230   PROTEINUR 30 (A) 09/24/2024 2230   UROBILINOGEN 1.0 10/04/2010 1524   NITRITE NEGATIVE 09/24/2024 2230   LEUKOCYTESUR NEGATIVE 09/24/2024 2230    Radiological Exams on Admission: I have personally reviewed images CT L-SPINE NO CHARGE Result Date: 09/24/2024 CLINICAL DATA:  Bilateral flank pain EXAM: CT LUMBAR SPINE WITHOUT CONTRAST TECHNIQUE: Multidetector CT imaging of the lumbar spine was performed without intravenous contrast administration. Multiplanar CT image reconstructions were also generated. RADIATION DOSE REDUCTION: This exam was performed according to the departmental dose-optimization program which includes automated exposure control, adjustment of the mA and/or kV according to patient size and/or use of iterative reconstruction technique. COMPARISON:  None Available. FINDINGS: Segmentation: 5 lumbar type vertebrae. Alignment: Normal. Vertebrae: No acute fracture or focal pathologic process. Paraspinal and other soft tissues: The paraspinal soft tissues are unremarkable. Atherosclerosis of the aorta and its branches. Trace bilateral pleural effusions. Disc levels: Findings at individual levels are as follows: L1/L2: Minimal broad-based disc bulge without compressive sequela. L2/L3: Mild circumferential disc bulge and bilateral facet hypertrophy. No significant compressive sequela. L3/L4: Mild circumferential disc bulge with bilateral facet and ligamentum flavum hypertrophy. Mild central canal stenosis. L4/L5: Circumferential disc bulge and bilateral  facet hypertrophy contribute to mild symmetrical bilateral neural foraminal encroachment. Evidence of prior right-sided laminotomy. L5/S1: Significant bilateral facet hypertrophy contributes to symmetrical bilateral neural foraminal encroachment. Reconstructed images demonstrate no additional findings. IMPRESSION: 1. No acute lumbar spine fracture. 2. Multilevel lumbar spondylosis and facet hypertrophy, with mild central canal stenosis at L3-4 and significant bilateral neural foraminal encroachment at L5-S1. 3.  Aortic Atherosclerosis (ICD10-I70.0). 4. Trace bilateral pleural effusions. Electronically Signed   By: Ozell Daring M.D.   On: 09/24/2024 21:56   CT ABDOMEN PELVIS W CONTRAST Result Date: 09/24/2024 CLINICAL DATA:  Bilateral flank pain EXAM: CT ABDOMEN AND PELVIS WITH CONTRAST TECHNIQUE: Multidetector CT imaging of the abdomen and pelvis was performed using the standard protocol following bolus administration of intravenous contrast. RADIATION DOSE REDUCTION: This exam was performed according to the departmental dose-optimization program which includes automated exposure control, adjustment of the mA and/or kV according to patient size and/or use of iterative reconstruction technique. CONTRAST:  75mL OMNIPAQUE  IOHEXOL  350 MG/ML SOLN COMPARISON:  08/26/2021 FINDINGS: Lower chest: Trace bilateral pleural effusions. No acute airspace disease. Postsurgical changes from aortic valve replacement and dual lead pacemaker. Dense calcification of the mitral annulus. Hepatobiliary: No focal liver abnormality is seen. No gallstones, gallbladder wall thickening, or biliary dilatation. Pancreas: Unremarkable. No pancreatic ductal dilatation or surrounding inflammatory changes. Spleen: Normal in size without focal abnormality. Adrenals/Urinary Tract: There are 2 right renal hypodensities. Within the upper pole a 2 cm hypodensity measures 45 HU on the nephrographic phase of the exam, and 35 HU on the delayed phase.  Within the lower pole there is  a 1.4 cm hypodensity measuring 33 HU on the nephrographic phase and 34 HU on the delayed phase. These are indeterminate, but likely reflect hyperdense cyst. Further evaluation with nonemergent renal ultrasound could be performed. No urinary tract calculi or obstructive uropathy. The adrenals and bladder are unremarkable. Stomach/Bowel: No bowel obstruction or ileus. There is significant fecal retention within the descending and sigmoid colon. Normal appendix right mid abdomen. No bowel wall thickening or inflammatory change. Vascular/Lymphatic: Aortic atherosclerosis. No enlarged abdominal or pelvic lymph nodes. Reproductive: Brachytherapy seeds are seen within the prostate bed. Other: No free fluid or free intraperitoneal gas. No abdominal wall hernia. Musculoskeletal: No acute or destructive bony abnormalities. Bilateral hip osteoarthritis, left greater than right. Reconstructed images demonstrate no additional findings. IMPRESSION: 1. No acute intra-abdominal or intrapelvic process. 2. Moderate stool within the distal colon consistent with constipation. No bowel obstruction or ileus. 3. Trace bilateral pleural effusions, right greater than left. 4. Indeterminate right renal hypodensities, favor hyperdense cysts. Nonemergent outpatient renal ultrasound may be useful for further characterization. 5.  Aortic Atherosclerosis (ICD10-I70.0). Electronically Signed   By: Ozell Daring M.D.   On: 09/24/2024 21:53   DG Chest 1 View Result Date: 09/24/2024 EXAM: 1 VIEW(S) XRAY OF THE CHEST 09/24/2024 08:35:58 PM COMPARISON: 10/31/2010 CLINICAL HISTORY: Weakness, fever. FINDINGS: LINES, TUBES AND DEVICES: Left chest dual-chamber pacemaker with leads projecting over right atrium and ventricle. LUNGS AND PLEURA: No focal pulmonary opacity. No pleural effusion. No pneumothorax. HEART AND MEDIASTINUM: Aortic arch calcifications. BONES AND SOFT TISSUES: Median sternotomy noted. IMPRESSION: 1. No  acute cardiopulmonary abnormality. Electronically signed by: Morgane Naveau MD 09/24/2024 08:40 PM EST RP Workstation: [id number]     EKG: My personal interpretation of EKG shows: Atrial fibrillation rate controlled heart rate 92 and premature ventricular complex.  Prolonged QTc.    Assessment/Plan: Principal Problem:   Fever of unknown origin Active Problems:   S/P bioprosthetic AVR - 21 mm Celestia Pod, Dr. Fleeta Trigt 09/2010   Status post placement of cardiac pacemaker   Type 2 diabetes mellitus without complication, without long-term current use of insulin  (HCC)   Essential hypertension   Paroxysmal atrial fibrillation (HCC)   History of complete AV block   Aortic stenosis s/p aortic valve placement   History of CAD (coronary artery disease)   Non-insulin  dependent type 2 diabetes mellitus (HCC)   Hypokalemia    Assessment and Plan: Fever of unknown origin -Present emergency department complaining of fever for 3 weeks with associated brain fog and generalized weakness..  Denies nausea, vomiting, neck pain/neck stiffness, headache, cough, congestion.  At presentation to ED patient is hemodynamically stable.  Afebrile.  CBC showing leukocytosis.  Unable to identify source of infection yet.  However patient reported that at the baseline he has chronic back pain but the pain has been progressively getting worse recently. - As source of infection has not been identified yet started treating with broad-spectrum antibiotic coverage with IV vancomycin  and Zosyn . - In the ED blood culture has been obtained.  UA ruled out UTI - Pending MRI of the lumbar spine and MRI of the thoracic spine to rule out discitis/osteomyelitis (delaying obtaining MRI given patient has a pacemaker) - Obtaining echocardiogram to rule out vegetations. - Continue broad-spectrum antibiotic coverage with IV vancomycin  and Zosyn  until blood culture will be resulted and source of infection will be  identified.   History of chronic back pain -History of chronic back pain with sciatica.  Pending MRI lumbar and thoracic spine to rule  out discitis or osteomyelitis.  Hypokalemia -Low potassium 3.1.  Replating with IV KCl 40 mEq.  Checking mag level  History of aortic stenosis s/p bioprosthesis aortic valve placement -Physical exam no evidence of murmur or regurgitation.  Continue cardiac monitoring.  History of paroxysmal atrial fibrillation History of AV block status post pacemaker -EKG showing atrial fibrillation with premature ventricular complex heart rate 96.  Continue Eliquis  and Lopressor  50 mg twice daily  Chronic diastolic heart failure History of essential hypertension -Blood pressure in good range.  Holding amlodipine  and spironolactone.  Currently continue Lopressor  50 mg twice daily.  History of CAD -Continue Eliquis , statin and Lopressor .  Insulin -dependent DM type II - Continue Jardiance  and heartedly carb modified diet.  Continue sliding scale insulin  with mealtime.   DVT prophylaxis:  Eliquis  Code Status:  Full Code Diet: Heart healthy carb modified diet Family Communication:   Family was present at bedside, at the time of interview. Opportunity was given to ask question and all questions were answered satisfactorily.  Disposition Plan: Need to follow with blood culture result, MRI lumbar and thoracic spine and echocardiogram results. Consults: None indicated at this time Admission status:   Inpatient, Telemetry bed  Severity of Illness: The appropriate patient status for this patient is INPATIENT. Inpatient status is judged to be reasonable and necessary in order to provide the required intensity of service to ensure the patient's safety. The patient's presenting symptoms, physical exam findings, and initial radiographic and laboratory data in the context of their chronic comorbidities is felt to place them at high risk for further clinical deterioration.  Furthermore, it is not anticipated that the patient will be medically stable for discharge from the hospital within 2 midnights of admission.   * I certify that at the point of admission it is my clinical judgment that the patient will require inpatient hospital care spanning beyond 2 midnights from the point of admission due to high intensity of service, high risk for further deterioration and high frequency of surveillance required.DEWAINE    Tevita Gomer, MD [facility]  How to contact the [facility] Attending or Consulting provider 7A - 7P or covering provider during after hours 7P -7A, for this patient.  Check the care team in [facility] and look for a) attending/consulting [facility] provider listed and b) the [facility] team listed Log into [email or web address] and use [facility]'s universal password to access. If you do not have the password, please contact the hospital operator. Locate the [facility] provider you are looking for under [facility] and page to a number that you can be directly reached. If you still have difficulty reaching the provider, please page the [facility] (Director on Call) for the Hospitalists listed on amion for assistance.  09/25/2024, 2:43 AM           [1] No Known Allergies  "

## 2024-09-25 NOTE — Progress Notes (Addendum)
 The patient is a 79 yr old man who presented to [facility] ED on 09/25/2023 with complaints of having felt badly since 09/07/2024. He states that he had a fever for 2 weeks with generalized weakness and brain fog. He then felt better for a couple of days, then he had another fever of 102 and came to the ED. He is also complaining of worsening back pain.  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  In the ED the patient was hemodynamically stable. He had WBC of 13, Na was 132, and K was 3.2. Lipase was elevated at 148, and lactic acid was normal. Blood cultures x 2 were collected and have grown out enterococcus fecalis. The patient has been continued on zosyn  and vancomycin . ID has been consulted and echocardiogram has been ordered. MRI of the T and L spine was performed. It has demonstrated No evidence of lumbar spine infection, diffuse disc and facet degeneration without high-grade spinal stenosis, there is also moderate to severe right and moderate left neural foraminal stenosis at L5-S1. There is also mild to moderal lateral recess stenosis at L3-L4.   The patient was admitted by my colleague, Dr. Sundil, earlier this morning.   Repeat blood cultures will be drawn on 09/27/2023.  The patient is resting comfortably other than complaining of back pain. No new complaints. He is awake, alert, and oriented x 3. Heart and lung exam are within normal limits. Abdomen is soft, non-tender, non-distended. Extremities are negative for cyanosis, clubbing or edema.

## 2024-09-25 NOTE — Progress Notes (Signed)
 PHARMACY - PHYSICIAN COMMUNICATION CRITICAL VALUE ALERT - BLOOD CULTURE IDENTIFICATION (BCID)  [name] [name] is an 79 y.o. male who presented to [facility] on 09/24/2024 with a chief complaint of fever   Assessment:  E faecalis bacteremia (?discitis, endocarditis)  Name of physician (or Provider) Contacted: Dr. Soledad  Current antibiotics: Zosyn  and Vancomycin   Changes to prescribed antibiotics recommended:  Continue current antibiotics for now. ID will be auto consulted.  Results for orders placed or performed during the hospital encounter of 09/24/24  Blood Culture ID Panel (Reflexed) (Collected: 09/25/2024  2:20 AM)  Result Value Ref Range   Enterococcus faecalis DETECTED (A) NOT DETECTED   Enterococcus Faecium NOT DETECTED NOT DETECTED   Listeria monocytogenes NOT DETECTED NOT DETECTED   Staphylococcus species NOT DETECTED NOT DETECTED   Staphylococcus aureus (BCID) NOT DETECTED NOT DETECTED   Staphylococcus epidermidis NOT DETECTED NOT DETECTED   Staphylococcus lugdunensis NOT DETECTED NOT DETECTED   Streptococcus species NOT DETECTED NOT DETECTED   Streptococcus agalactiae NOT DETECTED NOT DETECTED   Streptococcus pneumoniae NOT DETECTED NOT DETECTED   Streptococcus pyogenes NOT DETECTED NOT DETECTED   A.calcoaceticus-baumannii NOT DETECTED NOT DETECTED   Bacteroides fragilis NOT DETECTED NOT DETECTED   Enterobacterales NOT DETECTED NOT DETECTED   Enterobacter cloacae complex NOT DETECTED NOT DETECTED   Escherichia coli NOT DETECTED NOT DETECTED   Klebsiella aerogenes NOT DETECTED NOT DETECTED   Klebsiella oxytoca NOT DETECTED NOT DETECTED   Klebsiella pneumoniae NOT DETECTED NOT DETECTED   Proteus species NOT DETECTED NOT DETECTED   Salmonella species NOT DETECTED NOT DETECTED   Serratia marcescens NOT DETECTED NOT DETECTED   Haemophilus influenzae NOT DETECTED NOT DETECTED   Neisseria meningitidis NOT DETECTED NOT DETECTED   Pseudomonas aeruginosa NOT DETECTED NOT  DETECTED   Stenotrophomonas maltophilia NOT DETECTED NOT DETECTED   Candida albicans NOT DETECTED NOT DETECTED   Candida auris NOT DETECTED NOT DETECTED   Candida glabrata NOT DETECTED NOT DETECTED   Candida krusei NOT DETECTED NOT DETECTED   Candida parapsilosis NOT DETECTED NOT DETECTED   Candida tropicalis NOT DETECTED NOT DETECTED   Cryptococcus neoformans/gattii NOT DETECTED NOT DETECTED   Vancomycin  resistance NOT DETECTED NOT DETECTED    Vito Ralph, PharmD, BCPS Please see amion for complete clinical pharmacist phone list 09/25/2024  4:47 PM

## 2024-09-25 NOTE — ED Notes (Signed)
 Echo at bedside

## 2024-09-25 NOTE — ED Notes (Signed)
 Looking at the [facility], looks like patient has only received 2 runs of potassium.Orders are now overdue and require override to give.  DO Swayze notified. Per her request we will recheck BMP before giving more potassium.

## 2024-09-25 NOTE — Progress Notes (Signed)
" °  Echocardiogram 2D Echocardiogram has been performed.  Tinnie FORBES Gosling RDCS 09/25/2024, 11:13 AM "

## 2024-09-25 NOTE — Progress Notes (Signed)
 Pharmacy Antibiotic Note  [name] [name] is a 79 y.o. male admitted on 09/24/2024 with fever of unknown origin.  Pharmacy has been consulted for vancomycin  and Zosyn  dosing.  Plan: Vancomycin  1000 mg IV Q12H. Goal AUC 400-550.  Expected AUC 450.  SCr used 0.8.  Zosyn  3.375g IV Q8H (4-hour infusion).   Temp (24hrs), Avg:98.4 F (36.9 C), Min:98.3 F (36.8 C), Max:98.4 F (36.9 C)  Recent Labs  Lab 09/22/24 1044 09/24/24 1959 09/24/24 2003 09/24/24 2004 09/25/24 0220 09/25/24 0225  WBC 13.6* 13.0*  --   --  10.0  --   CREATININE 0.69 0.76 0.70  --  0.61  --   LATICACIDVEN  --   --   --  1.1  --  1.3    Estimated Creatinine Clearance: 79.7 mL/min (by C-G formula based on SCr of 0.61 mg/dL).    Allergies[1]  Thank you for allowing pharmacy to be a part of this patients care.  Marvetta Dauphin, PharmD, BCPS  09/25/2024 3:11 AM     [1] No Known Allergies

## 2024-09-25 NOTE — Progress Notes (Signed)
 ED Pharmacy Antibiotic Sign Off An antibiotic consult was received from an ED provider for zosyn /vancomycin  per pharmacy dosing for sepsis. A chart review was completed to assess appropriateness.   The following one time order(s) were placed:   -Zosyn  3.375gm IV x1 -Vanc 2g IV x1  Further antibiotic and/or antibiotic pharmacy consults should be ordered by the admitting provider if indicated.   Thank you for allowing pharmacy to be a part of this patient's care.   Lynwood Poplar, [facility]  Clinical Pharmacist 09/25/24 2:04 AM

## 2024-09-26 DIAGNOSIS — A498 Other bacterial infections of unspecified site: Secondary | ICD-10-CM

## 2024-09-26 DIAGNOSIS — Z952 Presence of prosthetic heart valve: Secondary | ICD-10-CM | POA: Diagnosis not present

## 2024-09-26 DIAGNOSIS — Z8679 Personal history of other diseases of the circulatory system: Secondary | ICD-10-CM | POA: Diagnosis not present

## 2024-09-26 DIAGNOSIS — E119 Type 2 diabetes mellitus without complications: Secondary | ICD-10-CM | POA: Diagnosis not present

## 2024-09-26 DIAGNOSIS — Z950 Presence of cardiac pacemaker: Secondary | ICD-10-CM | POA: Diagnosis not present

## 2024-09-26 DIAGNOSIS — I350 Nonrheumatic aortic (valve) stenosis: Secondary | ICD-10-CM | POA: Diagnosis not present

## 2024-09-26 LAB — BASIC METABOLIC PANEL WITH GFR
Anion gap: 14 (ref 5–15)
BUN: 13 mg/dL (ref 8–23)
CO2: 22 mmol/L (ref 22–32)
Calcium: 7.9 mg/dL — ABNORMAL LOW (ref 8.9–10.3)
Chloride: 98 mmol/L (ref 98–111)
Creatinine, Ser: 0.63 mg/dL (ref 0.61–1.24)
GFR, Estimated: >60 mL/min
Glucose, Bld: 91 mg/dL (ref 70–99)
Potassium: 3.1 mmol/L — ABNORMAL LOW (ref 3.5–5.1)
Sodium: 133 mmol/L — ABNORMAL LOW (ref 135–145)

## 2024-09-26 LAB — CBC WITH DIFFERENTIAL/PLATELET
Abs Immature Granulocytes: 0.08 K/uL — ABNORMAL HIGH (ref 0.00–0.07)
Basophils Absolute: 0.1 K/uL (ref 0.0–0.1)
Basophils Relative: 0 %
Eosinophils Absolute: 0 K/uL (ref 0.0–0.5)
Eosinophils Relative: 0 %
HCT: 35.4 % — ABNORMAL LOW (ref 39.0–52.0)
Hemoglobin: 12.5 g/dL — ABNORMAL LOW (ref 13.0–17.0)
Immature Granulocytes: 1 %
Lymphocytes Relative: 6 %
Lymphs Abs: 0.7 K/uL (ref 0.7–4.0)
MCH: 31.3 pg (ref 26.0–34.0)
MCHC: 35.3 g/dL (ref 30.0–36.0)
MCV: 88.5 fL (ref 80.0–100.0)
Monocytes Absolute: 1.5 K/uL — ABNORMAL HIGH (ref 0.1–1.0)
Monocytes Relative: 13 %
Neutro Abs: 9.6 K/uL — ABNORMAL HIGH (ref 1.7–7.7)
Neutrophils Relative %: 80 %
Platelets: 178 K/uL (ref 150–400)
RBC: 4 MIL/uL — ABNORMAL LOW (ref 4.22–5.81)
RDW: 13.8 % (ref 11.5–15.5)
WBC: 12 K/uL — ABNORMAL HIGH (ref 4.0–10.5)
nRBC: 0 % (ref 0.0–0.2)

## 2024-09-26 LAB — GLUCOSE, CAPILLARY
Glucose-Capillary: 96 mg/dL (ref 70–99)
Glucose-Capillary: 77 mg/dL (ref 70–99)
Glucose-Capillary: 82 mg/dL (ref 70–99)
Glucose-Capillary: 97 mg/dL (ref 70–99)

## 2024-09-26 MED ORDER — POTASSIUM CHLORIDE CRYS ER 20 MEQ PO TBCR
40.0000 meq | EXTENDED_RELEASE_TABLET | ORAL | Status: AC
  Administered 2024-09-26 (×2): 40 meq via ORAL
  Filled 2024-09-26 (×2): qty 2

## 2024-09-26 NOTE — Progress Notes (Signed)
 " Progress Note   PatientCALISTRO [name] FMW:[id number] DOB: December 08, 1945 DOA: 09/24/2024     1 DOS: the patient was seen and examined on 09/26/2024   Brief hospital course: The patient is a 79 yr old man who presented to [facility] ED on 09/25/2023 with complaints of having felt badly since 09/07/2024. He states that he had a fever for 2 weeks with generalized weakness and brain fog. He then felt better for a couple of days, then he had another fever of 102 and came to the ED. He is also complaining of worsening back pain.   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   In the ED the patient was hemodynamically stable. He had WBC of 13, Na was 132, and K was 3.2. Lipase was elevated at 148, and lactic acid was normal. Blood cultures x 2 were collected and have grown out enterococcus faecalis. The patient has been continued on zosyn  and vancomycin . ID has been consulted and echocardiogram has been ordered. MRI of the T and L spine was performed. It has demonstrated No evidence of lumbar spine infection, diffuse disc and facet degeneration without high-grade spinal stenosis, there is also moderate to severe right and moderate left neural foraminal stenosis at L5-S1. There is also mild to moderal lateral recess stenosis at L3-L4.   The patient's blood cultures have grown out enterococcus faecalis. Repeat blood cultures were drawn today. Infectious disease was consulted. The patient is currently receiving IV Zosyn .   Assessment and Plan: Principal Problem:   Fever of unknown origin Active Problems:   S/P bioprosthetic AVR - 21 mm Celestia Pod, Dr. Fleeta Trigt 09/2010   Status post  placement of cardiac pacemaker   Type 2 diabetes mellitus without complication, without long-term current use of insulin  (HCC)   Essential hypertension   Paroxysmal atrial fibrillation (HCC)   History of complete AV block   Aortic stenosis s/p aortic valve placement   History of CAD (coronary artery disease)   Non-insulin  dependent type 2 diabetes mellitus (HCC)   Hypokalemia   Assessment and Plan: Fever of unknown origin - Enterococcus faecalis Bacteremia -Present emergency department complaining of fever for 3 weeks with associated brain fog and generalized weakness..  Denies nausea, vomiting, neck pain/neck stiffness, headache, cough, congestion.  At presentation to ED patient is hemodynamically stable.  Afebrile.  CBC showing leukocytosis.  Unable to identify source of infection yet.  However patient reported that at the baseline he has chronic back pain but the pain has been progressively getting worse recently. - As source of infection has not been identified yet started treating with broad-spectrum antibiotic coverage with IV ampicillin , Rocephin , and Tigan  -  MRI of the lumbar spine and MRI of the thoracic spine has ruled out discitis/osteomyelitis  - Echocardiogram was obtained due to bacteremia. No vegetations were seen on TTE. Defer to ID whether or not the patient will need TEE given his aortic valve.   History of chronic back pain -History of chronic back pain with sciatica.  Pending MRI lumbar and thoracic spine to rule out discitis or osteomyelitis.   Hypokalemia -Low potassium 3.1.  Replating with IV KCl 40 mEq.  Magnesium 2.1   History of aortic stenosis s/p bioprosthesis aortic valve  placement -Physical exam no evidence of murmur or regurgitation.  Continue cardiac monitoring. No vegetations seen on TTE. May require TEE to rule out endocarditis.   History of paroxysmal atrial fibrillation History of AV block status post pacemaker -EKG showing atrial fibrillation with  premature ventricular complex heart rate 96.  Continue Eliquis  and Lopressor  50 mg twice daily   Chronic diastolic heart failure History of essential hypertension -Blood pressure in good range.  Holding amlodipine  and spironolactone.  Currently continue Lopressor  50 mg twice daily.   History of CAD -Continue Eliquis , statin and Lopressor .   Insulin -dependent DM type II - Continue Jardiance  and heartedly carb modified diet.  Continue sliding scale insulin  with mealtime.     DVT prophylaxis:  Eliquis  Code Status:  Full Code Diet: Heart healthy carb modified diet Family Communication:   Family was present at bedside, at the time of interview. Opportunity was given to ask question and all questions were answered satisfactorily.  Disposition Plan: Need to follow with blood culture result, MRI lumbar and thoracic spine and echocardiogram results. Consults: Infectious disease       Subjective: The patient is resting comfortably. He states that he is feeling a little better.  Physical Exam: Vitals:   09/25/24 2005 09/26/24 0438 09/26/24 0729 09/26/24 1358  BP: (!) 142/79 124/73 135/77 (!) 150/71  Pulse: 92 92 85 78  Resp: 18  16 16   Temp: 98.3 F (36.8 C) 98.2 F (36.8 C) 98.6 F (37 C) 98.6 F (37 C)  TempSrc: Oral Oral Oral Oral  SpO2: 93% 92% 93% 90%   Exam:  Constitutional:  The patient is awake, alert, and oriented x 3. No acute distress. Respiratory:  No increased work of breathing. No wheezes, rales, or rhonchi No tactile fremitus Cardiovascular:  Regular rate and rhythm No murmurs, ectopy, or gallups. No lateral PMI. No thrills. Abdomen:  Abdomen is soft, non-tender, non-distended No hernias, masses, or organomegaly Normoactive bowel sounds.  Musculoskeletal:  No cyanosis, clubbing, or edema Skin:  No rashes, lesions, ulcers palpation of skin: no induration or nodules Neurologic:  CN 2-12 intact Sensation all 4 extremities intact Psychiatric:  Mental  status Mood, affect appropriate Orientation to person, place, time  judgment and insight appear intact  Data Reviewed:  CBC, BMP, TTE  Family Communication: None available  Disposition: Status is: Inpatient Remains inpatient appropriate because: bacteremia  Planned Discharge Destination: Home    Time spent: 42 minutes  Author: Brigida Bureau, DO 09/26/2024 6:43 PM  For on call review [email or web address].  "

## 2024-09-26 NOTE — Consult Note (Signed)
 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 If PM is infected--which I worry it is, and it cannot be removed he will need lifelong HIGH dose amoxicillin after those 6 weeks Standard universal precautions  I will follow-up his culture data and I am available for questions over the weekend.  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 Fleeta Trigt 09/2010   Status post placement of cardiac pacemaker   Type 2 diabetes mellitus without complication, without long-term current use of insulin  (HCC)   Essential hypertension   Paroxysmal atrial fibrillation (HCC)   Fever of unknown origin   History of complete AV block   Aortic stenosis s/p aortic valve placement   History of CAD (coronary artery disease)   Non-insulin  dependent type 2 diabetes mellitus (HCC)   Hypokalemia   Scheduled Meds:  apixaban   5 mg Oral BID   atorvastatin   40 mg Oral Daily   empagliflozin   10 mg Oral Daily   insulin  aspart  0-6 Units Subcutaneous TID WC   metoprolol  tartrate  50 mg Oral BID   potassium chloride   40 mEq Oral Q4H   sodium chloride  flush  3 mL Intravenous Q12H   sodium chloride  flush  3 mL Intravenous Q12H   Continuous Infusions:   ampicillin  (OMNIPEN) IV 2 g (09/26/24 1351)   cefTRIAXone  (ROCEPHIN )  IV Stopped (09/26/24 0700)   PRN Meds:.acetaminophen , oxyCODONE , sodium chloride  flush, trimethobenzamide   HPI: [name] [name] is a 79 y.o. male with past medical history significant for AV block status post dual chamber pacemaker in November 2011, generator change in 2022 paroxysmal atrial fibrillation aortic stenosis status post aortic valve replacement bioprosthetic valve in January 2012 heart failure prostate cancer hyperlipidemia coronary artery disease non-insulin -dependent diabetes mellitus who would had ongoing malaise for 2 weeks as well as fevers confusion and profound weakness that made it difficult for him to even get out of a chair.  He then became febrile to 102 degrees.  Along the way he was seen at [facility] and found to be orthostatic and given fluids and DC home  He returned and was admitted 09/24/2024 with ongoing fevers and weakness and confusion.  Blood cultures were taken and he was started on vancomycin  and Zosyn .  Had had back pain and has had MRIs performed of the thoracic and lumbar spine without evidence of infection.  His blood cultures now positive for Enterococcus faecalis.  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  2 D echocadiogram has been completed.  I have paged Trish with cardiology to arrange for transesophageal  echocardiogram that will happen on Monday electrophysiology is also aware of the patient being here.  Repeat blood cultures as well to make sure that he is clearing his bacteremia.  I personally spent a total of 80 minutes in the care of the patient today including preparing to see the patient, getting/reviewing separately obtained history, performing a medically appropriate  exam/evaluation, counseling and educating, placing orders, referring and communicating with other health care professionals, documenting clinical information in the EHR, independently interpreting results, communicating results, and coordinating care.   Evaluation of the patient requires complex antimicrobial therapevaluation, counseling , isolation needs to reduce disease transmission and risk assessment and mitigation.     Review of Systems: Review of Systems  Constitutional:  Positive for chills, fever and malaise/fatigue. Negative for weight loss.  HENT:  Negative for congestion and sore throat.   Eyes:  Negative for blurred vision and photophobia.  Respiratory:  Negative for cough, shortness of breath and wheezing.   Cardiovascular:  Negative for chest pain, palpitations and leg swelling.  Gastrointestinal:  Negative for abdominal pain, blood in stool, constipation, diarrhea, heartburn, melena, nausea and vomiting.  Genitourinary:  Negative for dysuria, flank pain and hematuria.  Musculoskeletal:  Positive for back pain. Negative for falls, joint pain and myalgias.  Skin:  Negative for itching and rash.  Neurological:  Positive for weakness. Negative for dizziness, focal weakness, loss of consciousness and headaches.  Endo/Heme/Allergies:  Does not bruise/bleed easily.  Psychiatric/Behavioral:  Negative for depression and suicidal ideas. The patient does not have insomnia.     Past Medical History:  Diagnosis Date   Anticoagulant long-term use    eliquis --- managed by cardiology   Aortic valve insufficiency    cardiologist--- dr francyne;   01/ 2012  s/p AVR for severe AS , bicusipd;  last echo 12-25-2019  mild AR no stenosis, mean grandiant   BPH associated with nocturia    Coronary artery disease    cardiologist--- dr croitoru;   nuclear stress test 02-28-2007  normal perfusion no ischemia, nuclear ef 58%;   cardiac cath 06-30-2010  severe AS and nonobstructive disease  involving LAD/ CFx/ RCA   Heart murmur    History of non-ST elevation myocardial infarction (NSTEMI) 06/2010   History of ventricular tachycardia 06/2010   nonsustained VT in setting NSTEMI; severe AS   Hypertension    Malignant neoplasm prostate (HCC) 07/2021   urologist--- dr gay:  dx 11/ 2022 Gleason 4+3, PSA 11.9   Mitral insufficiency    per echo 12-25-2019  mile regurg,  no stenosis, degenerative   Mixed dyslipidemia    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 patel   (01-13-2022  pt stated only checks sugar occasionally   Wears glasses     Social History[1]  History reviewed. No pertinent family history. Allergies[2]  OBJECTIVE: Blood pressure (!) 150/71, pulse 78, temperature 98.6 F (37 C),  temperature source Oral, resp. rate 16, SpO2 90%.  Physical Exam Constitutional:      Appearance: He is well-developed.  HENT:     Head: Normocephalic and atraumatic.  Eyes:     Conjunctiva/sclera: Conjunctivae normal.  Cardiovascular:     Rate and Rhythm: Normal rate and regular rhythm.     Heart sounds: No murmur heard.    No friction rub. No gallop.  Pulmonary:     Effort: Pulmonary effort is normal. No respiratory distress.     Breath sounds: No stridor. No wheezing or rhonchi.  Abdominal:     General: Bowel sounds are normal. There is no distension.     Palpations: Abdomen is soft.  Musculoskeletal:        General: No tenderness. Normal  range of motion.     Cervical back: Normal range of motion and neck supple.  Skin:    General: Skin is warm and dry.     Coloration: Skin is not pale.     Findings: No erythema or rash.  Neurological:     General: No focal deficit present.     Mental Status: He is alert and oriented to person, place, and time.  Psychiatric:        Mood and Affect: Mood normal.        Behavior: Behavior normal.        Thought Content: Thought content normal.        Judgment: Judgment normal.    Pacemaker generator is clean  Lab Results Lab Results  Component Value Date   WBC 12.0 (H) 09/26/2024   HGB 12.5 (L) 09/26/2024   HCT 35.4 (L) 09/26/2024   MCV 88.5 09/26/2024   PLT 178 09/26/2024    Lab Results  Component Value Date   CREATININE 0.63 09/26/2024   BUN 13 09/26/2024   NA 133 (L) 09/26/2024   K 3.1 (L) 09/26/2024   CL 98 09/26/2024   CO2 22 09/26/2024    Lab Results  Component Value Date   ALT 41 09/24/2024   AST 41 09/24/2024   ALKPHOS 89 09/24/2024   BILITOT 1.2 09/24/2024     Microbiology: Recent Results (from the past 240 hours)  Resp panel by RT-PCR (RSV, Flu A&B, Covid) Anterior Nasal Swab     Status: None   Collection Time: 09/22/24 12:55 PM   Specimen: Anterior Nasal Swab  Result Value Ref Range Status   SARS Coronavirus 2 by RT PCR NEGATIVE NEGATIVE Final    Comment: (NOTE) SARS-CoV-2 target nucleic acids are NOT DETECTED.  The SARS-CoV-2 RNA is generally detectable in upper respiratory specimens during the acute phase of infection. The lowest concentration of SARS-CoV-2 viral copies this assay can detect is 138 copies/mL. A negative result does not preclude SARS-Cov-2 infection and should not be used as the sole basis for treatment or other patient management decisions. A negative result may occur with  improper specimen collection/handling, submission of specimen other than nasopharyngeal swab, presence of viral mutation(s) within the areas targeted by this  assay, and inadequate number of viral copies(<138 copies/mL). A negative result must be combined with clinical observations, patient history, and epidemiological information. The expected result is Negative.  Fact Sheet for Patients:  bloggercourse.com  Fact Sheet for Healthcare Providers:  seriousbroker.it  This test is no t yet approved or cleared by the [place]  FDA and  has been authorized for detection and/or diagnosis of SARS-CoV-2 by FDA under an Emergency Use Authorization (EUA). This EUA will  remain  in effect (meaning this test can be used) for the duration of the COVID-19 declaration under Section 564(b)(1) of the Act, 21 U.S.C.section 360bbb-3(b)(1), unless the authorization is terminated  or revoked sooner.       Influenza A by PCR NEGATIVE NEGATIVE Final   Influenza B by PCR NEGATIVE NEGATIVE Final    Comment: (NOTE) The Xpert Xpress SARS-CoV-2/FLU/RSV plus assay is intended as an aid in the diagnosis of influenza from Nasopharyngeal swab specimens and should not be used as a sole basis for treatment. Nasal washings and aspirates are unacceptable for Xpert Xpress SARS-CoV-2/FLU/RSV testing.  Fact Sheet for Patients: bloggercourse.com  Fact Sheet for Healthcare Providers: seriousbroker.it  This test is not yet approved or cleared by the [place]  FDA and has been authorized for detection and/or diagnosis of SARS-CoV-2 by FDA under an Emergency Use Authorization (EUA). This EUA will remain in effect (meaning this test can be used) for the duration of the COVID-19 declaration under Section 564(b)(1) of the Act, 21 U.S.C. section 360bbb-3(b)(1), unless the authorization is terminated or revoked.     Resp Syncytial Virus by PCR NEGATIVE NEGATIVE Final    Comment: (NOTE) Fact Sheet for Patients: bloggercourse.com  Fact Sheet for  Healthcare Providers: seriousbroker.it  This test is not yet approved or cleared by the [place]  FDA and has been authorized for detection and/or diagnosis of SARS-CoV-2 by FDA under an Emergency Use Authorization (EUA). This EUA will remain in effect (meaning this test can be used) for the duration of the COVID-19 declaration under Section 564(b)(1) of the Act, 21 U.S.C. section 360bbb-3(b)(1), unless the authorization is terminated or revoked.  Performed at [facility], [address] [place], [place] [address]   Resp panel by RT-PCR (RSV, Flu A&B, Covid) Anterior Nasal Swab     Status: None   Collection Time: 09/24/24 11:10 PM   Specimen: Anterior Nasal Swab  Result Value Ref Range Status   SARS Coronavirus 2 by RT PCR NEGATIVE NEGATIVE Final   Influenza A by PCR NEGATIVE NEGATIVE Final   Influenza B by PCR NEGATIVE NEGATIVE Final    Comment: (NOTE) The Xpert Xpress SARS-CoV-2/FLU/RSV plus assay is intended as an aid in the diagnosis of influenza from Nasopharyngeal swab specimens and should not be used as a sole basis for treatment. Nasal washings and aspirates are unacceptable for Xpert Xpress SARS-CoV-2/FLU/RSV testing.  Fact Sheet for Patients: bloggercourse.com  Fact Sheet for Healthcare Providers: seriousbroker.it  This test is not yet approved or cleared by the [place]  FDA and has been authorized for detection and/or diagnosis of SARS-CoV-2 by FDA under an Emergency Use Authorization (EUA). This EUA will remain in effect (meaning this test can be used) for the duration of the COVID-19 declaration under Section 564(b)(1) of the Act, 21 U.S.C. section 360bbb-3(b)(1), unless the authorization is terminated or revoked.     Resp Syncytial Virus by PCR NEGATIVE NEGATIVE Final    Comment: (NOTE) Fact Sheet for Patients: bloggercourse.com  Fact  Sheet for Healthcare Providers: seriousbroker.it  This test is not yet approved or cleared by the [place]  FDA and has been authorized for detection and/or diagnosis of SARS-CoV-2 by FDA under an Emergency Use Authorization (EUA). This EUA will remain in effect (meaning this test can be used) for the duration of the COVID-19 declaration under Section 564(b)(1) of the Act, 21 U.S.C. section 360bbb-3(b)(1), unless the authorization is terminated or revoked.  Performed at [facility], [address]  [place], [place] [address]   Blood culture (routine x 2)     Status: Abnormal (Preliminary result)   Collection Time: 09/25/24  2:20 AM   Specimen: BLOOD  Result Value Ref Range Status   Specimen Description BLOOD SITE NOT SPECIFIED  Final   Special Requests   Final    BOTTLES DRAWN AEROBIC AND ANAEROBIC Blood Culture adequate volume   Culture  Setup Time   Final    GRAM POSITIVE COCCI IN BOTH AEROBIC AND ANAEROBIC BOTTLES CRITICAL RESULT CALLED TO, READ BACK BY AND VERIFIED WITH: PHARMD C. AMEND [phone] @ [phone] FH    Culture (A)  Final    ENTEROCOCCUS FAECALIS SUSCEPTIBILITIES TO FOLLOW Performed at [facility], [address], [place], [place] [address]    Report Status PENDING  Incomplete  Blood Culture ID Panel (Reflexed)     Status: Abnormal   Collection Time: 09/25/24  2:20 AM  Result Value Ref Range Status   Enterococcus faecalis DETECTED (A) NOT DETECTED Final    Comment: CRITICAL RESULT CALLED TO, READ BACK BY AND VERIFIED WITH: PHARMD C. AMEND [phone] @ 1638 FH    Enterococcus Faecium NOT DETECTED NOT DETECTED Final   Listeria monocytogenes NOT DETECTED NOT DETECTED Final   Staphylococcus species NOT DETECTED NOT DETECTED Final   Staphylococcus aureus (BCID) NOT DETECTED NOT DETECTED Final   Staphylococcus epidermidis NOT DETECTED NOT DETECTED Final   Staphylococcus lugdunensis NOT DETECTED NOT DETECTED Final   Streptococcus  species NOT DETECTED NOT DETECTED Final   Streptococcus agalactiae NOT DETECTED NOT DETECTED Final   Streptococcus pneumoniae NOT DETECTED NOT DETECTED Final   Streptococcus pyogenes NOT DETECTED NOT DETECTED Final   A.calcoaceticus-baumannii NOT DETECTED NOT DETECTED Final   Bacteroides fragilis NOT DETECTED NOT DETECTED Final   Enterobacterales NOT DETECTED NOT DETECTED Final   Enterobacter cloacae complex NOT DETECTED NOT DETECTED Final   Escherichia coli NOT DETECTED NOT DETECTED Final   Klebsiella aerogenes NOT DETECTED NOT DETECTED Final   Klebsiella oxytoca NOT DETECTED NOT DETECTED Final   Klebsiella pneumoniae NOT DETECTED NOT DETECTED Final   Proteus species NOT DETECTED NOT DETECTED Final   Salmonella species NOT DETECTED NOT DETECTED Final   Serratia marcescens NOT DETECTED NOT DETECTED Final   Haemophilus influenzae NOT DETECTED NOT DETECTED Final   Neisseria meningitidis NOT DETECTED NOT DETECTED Final   Pseudomonas aeruginosa NOT DETECTED NOT DETECTED Final   Stenotrophomonas maltophilia NOT DETECTED NOT DETECTED Final   Candida albicans NOT DETECTED NOT DETECTED Final   Candida auris NOT DETECTED NOT DETECTED Final   Candida glabrata NOT DETECTED NOT DETECTED Final   Candida krusei NOT DETECTED NOT DETECTED Final   Candida parapsilosis NOT DETECTED NOT DETECTED Final   Candida tropicalis NOT DETECTED NOT DETECTED Final   Cryptococcus neoformans/gattii NOT DETECTED NOT DETECTED Final   Vancomycin  resistance NOT DETECTED NOT DETECTED Final    Comment: Performed at [facility], [address], [place], [place] [address]  Blood culture (routine x 2)     Status: None (Preliminary result)   Collection Time: 09/25/24  7:02 PM   Specimen: BLOOD LEFT HAND  Result Value Ref Range Status   Specimen Description BLOOD LEFT HAND  Final   Special Requests   Final    BOTTLES DRAWN AEROBIC AND ANAEROBIC Blood Culture adequate volume   Culture   Final    NO GROWTH < 12  HOURS Performed at [facility], [address], [place], [place]  [address]    Report Status PENDING  Incomplete    [name] Fleeta Rothman, MD [facility] [phone] pager  09/26/2024, 4:24 PM      [1]  Social History Tobacco Use   Smoking status: Former    Current packs/day: 0.00    Types: Cigarettes    Start date: 45    Quit date: 1995    Years since quitting: 31.0   Smokeless tobacco: Former    Types: Chew    Quit date: 1995  Vaping Use   Vaping status: Never Used  Substance Use Topics   Alcohol use: No   Drug use: Never  [2] No Known Allergies  "

## 2024-09-26 NOTE — Plan of Care (Signed)

## 2024-09-26 NOTE — Progress Notes (Signed)
" ° °  [facility] has been requested to perform a transesophageal echocardiogram on [facility] for enterococcus fecalis bacteremia.    Patient has a [employer] bioprosthetic aortic valve and Medtronic Azure dual-chamber permanent pacemaker.   The patient does NOT have any absolute or relative contraindications to a Transesophageal Echocardiogram (TEE).  The patient has: No other conditions that may impact this procedure.    After careful review of history and examination, the risks and benefits of transesophageal echocardiogram have been explained including risks of esophageal damage, perforation (1:10,000 risk), bleeding, pharyngeal hematoma as well as other potential complications associated with conscious sedation including aspiration, arrhythmia, respiratory failure and death. Alternatives to treatment were discussed, questions were answered. Patient is willing to proceed.   Bonney Morse Clause, PA-C  09/26/2024 3:20 PM   "

## 2024-09-26 NOTE — Plan of Care (Signed)
  Problem: Education: Goal: Knowledge of General Education information will improve Description: Including pain rating scale, medication(s)/side effects and non-pharmacologic comfort measures Outcome: Progressing   Problem: Health Behavior/Discharge Planning: Goal: Ability to manage health-related needs will improve Outcome: Progressing   Problem: Nutrition: Goal: Adequate nutrition will be maintained Outcome: Progressing   Problem: Coping: Goal: Level of anxiety will decrease Outcome: Progressing   Problem: Pain Managment: Goal: General experience of comfort will improve and/or be controlled Outcome: Progressing   Problem: Safety: Goal: Ability to remain free from injury will improve Outcome: Progressing   Problem: Skin Integrity: Goal: Risk for impaired skin integrity will decrease Outcome: Progressing

## 2024-09-27 DIAGNOSIS — A498 Other bacterial infections of unspecified site: Secondary | ICD-10-CM | POA: Diagnosis not present

## 2024-09-27 LAB — BASIC METABOLIC PANEL WITH GFR
Anion gap: 13 (ref 5–15)
BUN: 13 mg/dL (ref 8–23)
CO2: 22 mmol/L (ref 22–32)
Calcium: 8 mg/dL — ABNORMAL LOW (ref 8.9–10.3)
Chloride: 98 mmol/L (ref 98–111)
Creatinine, Ser: 0.67 mg/dL (ref 0.61–1.24)
GFR, Estimated: >60 mL/min
Glucose, Bld: 83 mg/dL (ref 70–99)
Potassium: 4.4 mmol/L (ref 3.5–5.1)
Sodium: 132 mmol/L — ABNORMAL LOW (ref 135–145)

## 2024-09-27 LAB — CBC WITH DIFFERENTIAL/PLATELET
Abs Immature Granulocytes: 0.12 K/uL — ABNORMAL HIGH (ref 0.00–0.07)
Basophils Absolute: 0.1 K/uL (ref 0.0–0.1)
Basophils Relative: 1 %
Eosinophils Absolute: 0.1 K/uL (ref 0.0–0.5)
Eosinophils Relative: 1 %
HCT: 36.6 % — ABNORMAL LOW (ref 39.0–52.0)
Hemoglobin: 12.6 g/dL — ABNORMAL LOW (ref 13.0–17.0)
Immature Granulocytes: 1 %
Lymphocytes Relative: 6 %
Lymphs Abs: 0.8 K/uL (ref 0.7–4.0)
MCH: 30.9 pg (ref 26.0–34.0)
MCHC: 34.4 g/dL (ref 30.0–36.0)
MCV: 89.7 fL (ref 80.0–100.0)
Monocytes Absolute: 1.8 K/uL — ABNORMAL HIGH (ref 0.1–1.0)
Monocytes Relative: 13 %
Neutro Abs: 10.7 K/uL — ABNORMAL HIGH (ref 1.7–7.7)
Neutrophils Relative %: 78 %
Platelets: 203 K/uL (ref 150–400)
RBC: 4.08 MIL/uL — ABNORMAL LOW (ref 4.22–5.81)
RDW: 13.8 % (ref 11.5–15.5)
WBC: 13.6 K/uL — ABNORMAL HIGH (ref 4.0–10.5)
nRBC: 0 % (ref 0.0–0.2)

## 2024-09-27 LAB — CULTURE, BLOOD (ROUTINE X 2): Special Requests: ADEQUATE

## 2024-09-27 LAB — GLUCOSE, CAPILLARY
Glucose-Capillary: 89 mg/dL (ref 70–99)
Glucose-Capillary: 107 mg/dL — ABNORMAL HIGH (ref 70–99)
Glucose-Capillary: 94 mg/dL (ref 70–99)
Glucose-Capillary: 96 mg/dL (ref 70–99)

## 2024-09-27 NOTE — Evaluation (Signed)
 Occupational Therapy Evaluation Patient Details Name: [name] [name] MRN: [id number] DOB: [birth year] Today's Date: 09/27/2024   History of Present Illness   79 y.o. male presents 09/24/24 with c/o fever, generalized weakness, and brain fog since 09/07/24. Seen at [facility] ED found to have hypotension and AKI. Pt with c/o back pain, MRIs performed of the thoracic and lumbar spine without evidence of infection.  Blood cultures now positive for Enterococcus faecalis. TEE planned 09/29/24. 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     Clinical Impressions PTA Pt reports he was independent with ADL/IADLs and functional mobility. Pt currently requires up to Max A for ADL engagement and Mod A for functional transfers. Pt primarily limited by decreased activity tolerance, unsteadiness on feet, anxiety regarding mobility, pain, and generalized weakness. OT to continue to follow Pt acutely to facilitate progress towards goals. Patient will benefit from continued inpatient follow up therapy, <3 hours/day.      If plan is discharge home, recommend the following:   A lot of help with walking and/or transfers;A lot of help with bathing/dressing/bathroom;Assistance with cooking/housework;Direct supervision/assist for medications management;Assist for transportation;Help with stairs or ramp for entrance     Functional Status Assessment   Patient has had a recent decline in their functional status and demonstrates the ability to make significant improvements in function in a reasonable and predictable amount of time.     Equipment Recommendations   Other (comment) (defer)     Recommendations for Other Services   PT consult     Precautions/Restrictions   Precautions Precautions: Fall Recall of  Precautions/Restrictions: Intact Restrictions Weight Bearing Restrictions Per Provider Order: No     Mobility Bed Mobility Overal bed mobility: Needs Assistance Bed Mobility: Supine to Sit, Sit to Supine     Supine to sit: Mod assist, Used rails, HOB elevated Sit to supine: Mod assist, HOB elevated, Used rails   General bed mobility comments: Pt required increased time to come to EOB on R side. Pt required support for trunk when coming to sit and support for BLE on return to supine    Transfers Overall transfer level: Needs assistance Equipment used: 1 person hand held assist Transfers: Sit to/from Stand Sit to Stand: Mod assist, From elevated surface           General transfer comment: Mod A to rise from bed and take three side steps to the R towards HOB. Use of RW for BUE support for future session      Balance Overall balance assessment: Needs assistance Sitting-balance support: Single extremity supported, Feet supported Sitting balance-Leahy Scale: Fair Sitting balance - Comments: Close supervision sitting EOB, cannot challenge weight shift   Standing balance support: Single extremity supported, During functional activity, Reliant on assistive device for balance Standing balance-Leahy Scale: Poor Standing balance comment: dependent on external support                           ADL either performed or assessed with clinical judgement   ADL Overall ADL's : Needs assistance/impaired Eating/Feeding: Set up;Sitting   Grooming: Set up;Sitting   Upper Body Bathing: Moderate assistance;Sitting   Lower Body Bathing: Maximal assistance   Upper Body Dressing : Minimal assistance;Sitting   Lower Body Dressing: Maximal assistance   Toilet Transfer: Moderate assistance;Stand-pivot;BSC/3in1   Toileting- Clothing Manipulation  and Hygiene: Total assistance               Vision Patient Visual Report: No change from baseline Vision Assessment?: No  apparent visual deficits     Perception         Praxis         Pertinent Vitals/Pain Pain Assessment Pain Assessment: 0-10 Pain Score: 6  Pain Location: back Pain Descriptors / Indicators: Discomfort, Grimacing, Guarding Pain Intervention(s): Limited activity within patient's tolerance, Monitored during session, Repositioned     Extremity/Trunk Assessment Upper Extremity Assessment Upper Extremity Assessment: Generalized weakness   Lower Extremity Assessment Lower Extremity Assessment: Defer to PT evaluation   Cervical / Trunk Assessment Cervical / Trunk Assessment: Normal   Communication Communication Communication: No apparent difficulties   Cognition Arousal: Alert Behavior During Therapy: Anxious Cognition: No apparent impairments                               Following commands: Intact       Cueing  General Comments   Cueing Techniques: Verbal cues;Visual cues;Gestural cues  VSS during mobility.   Exercises     Shoulder Instructions      Home Living Family/patient expects to be discharged to:: Private residence Living Arrangements: Alone Available Help at Discharge: Family;Available PRN/intermittently Type of Home: House Home Access: Stairs to enter [employer] of Steps: 3   Home Layout: One level     Bathroom Shower/Tub: Chief Strategy Officer: Standard     Home Equipment: Agricultural Consultant (2 wheels);Cane - single point;BSC/3in1          Prior Functioning/Environment Prior Level of Function : Independent/Modified Independent;Driving             Mobility Comments: Ind ADLs Comments: Ind    OT Problem List: Decreased strength;Decreased activity tolerance;Impaired balance (sitting and/or standing);Decreased safety awareness;Decreased knowledge of use of DME or AE;Decreased knowledge of precautions;Pain   OT Treatment/Interventions: Self-care/ADL training;Therapeutic exercise;Energy  conservation;DME and/or AE instruction;Therapeutic activities;Patient/family education;Balance training      OT Goals(Current goals can be found in the care plan section)   Acute Rehab OT Goals Patient Stated Goal: to get better OT Goal Formulation: With patient Time For Goal Achievement: 10/11/24 Potential to Achieve Goals: Good ADL Goals Pt Will Perform Lower Body Dressing: with min assist;with adaptive equipment Pt Will Transfer to Toilet: with contact guard assist;stand pivot transfer;bedside commode Pt/caregiver will Perform Home Exercise Program: Both right and left upper extremity;Increased strength;With written HEP provided Additional ADL Goal #1: Pt will engage in bed mobility with supervision as a precursor to ADL tasks OOB   OT Frequency:  Min 2X/week    Co-evaluation              AM-PAC OT 6 Clicks Daily Activity     Outcome Measure Help from another person eating meals?: A Little Help from another person taking care of personal grooming?: A Little Help from another person toileting, which includes using toliet, bedpan, or urinal?: Total Help from another person bathing (including washing, rinsing, drying)?: A Lot Help from another person to put on and taking off regular upper body clothing?: A Little Help from another person to put on and taking off regular lower body clothing?: A Lot 6 Click Score: 14   End of Session Equipment Utilized During Treatment: Gait belt Nurse Communication: Mobility status  Activity Tolerance: Patient tolerated treatment well Patient left: in bed;with  call bell/phone within reach;with bed alarm set  OT Visit Diagnosis: Unsteadiness on feet (R26.81);Muscle weakness (generalized) (M62.81);Pain                Time: 1336-1401 OT Time Calculation (min): 25 min Charges:  OT General Charges $OT Visit: 1 Visit OT Evaluation $OT Eval Low Complexity: 1 Low OT Treatments $Therapeutic Activity: 8-22 mins  Maurilio CROME, OTR/L.  [facility] Acute  Rehabilitation  Office: [phone]   Maurilio PARAS Shaniece Bussa 09/27/2024, 4:20 PM

## 2024-09-27 NOTE — Plan of Care (Signed)
" °  Problem: Skin Integrity: Goal: Risk for impaired skin integrity will decrease Outcome: Progressing   Problem: Clinical Measurements: Goal: Will remain free from infection Outcome: Progressing   Problem: Clinical Measurements: Goal: Diagnostic test results will improve Outcome: Progressing   Problem: Pain Managment: Goal: General experience of comfort will improve and/or be controlled Outcome: Progressing   "

## 2024-09-27 NOTE — Progress Notes (Signed)
 " Progress Note   PatientCAPERS [name] FMW:[id number] DOB: March 17, 1946 DOA: 09/24/2024     2 DOS: the patient was seen and examined on 09/27/2024   Brief hospital course: The patient is a 79 yr old man who presented to [facility] ED on 09/25/2023 with complaints of having felt badly since 09/07/2024. He states that he had a fever for 2 weeks with generalized weakness and brain fog. He then felt better for a couple of days, then he had another fever of 102 and came to the ED. He is also complaining of worsening back pain.   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   In the ED the patient was hemodynamically stable. He had WBC of 13, Na was 132, and K was 3.2. Lipase was elevated at 148, and lactic acid was normal. Blood cultures x 2 were collected and have grown out enterococcus faecalis. The patient has been continued on zosyn  and vancomycin . ID has been consulted and echocardiogram has been ordered. MRI of the T and L spine was performed. It has demonstrated No evidence of lumbar spine infection, diffuse disc and facet degeneration without high-grade spinal stenosis, there is also moderate to severe right and moderate left neural foraminal stenosis at L5-S1. There is also mild to moderal lateral recess stenosis at L3-L4.   The patient's blood cultures have grown out enterococcus faecalis. Repeat blood cultures were drawn today. Infectious disease was consulted. The patient is currently receiving IV Zosyn .   Infectious disease has been consulted. They have recommended TEE on 09/29/2024. They feel that endocarditis is likely and also have serious concern for infection of the pacer leads.  The patient will require a miminum of 6 weeks of IV antibiotics. The patient has been switched to high dose ampicillin , ceftriaxone , and tigan .  Assessment and Plan: Principal Problem:   Fever of unknown origin Active Problems:   S/P bioprosthetic AVR - 21 mm Celestia Pod, Dr. Fleeta Trigt 09/2010   Status post placement of cardiac pacemaker   Type 2 diabetes mellitus without complication, without long-term current use of insulin  (HCC)   Essential hypertension   Paroxysmal atrial fibrillation (HCC)   History of complete AV block   Aortic stenosis s/p aortic valve placement   History of CAD (coronary artery disease)   Non-insulin  dependent type 2 diabetes mellitus (HCC)   Hypokalemia   Assessment and Plan: Fever of unknown origin - Enterococcus faecalis Bacteremia -Present emergency department complaining of fever for 3 weeks with associated brain fog and generalized weakness..  Denies nausea, vomiting, neck pain/neck stiffness, headache, cough, congestion.  At presentation to ED patient is hemodynamically stable.  Afebrile.  CBC showing leukocytosis.  Unable to identify source of infection yet.  However patient reported that at the baseline he has chronic back pain but the pain has been progressively getting worse recently. -  MRI of the lumbar spine and MRI of the thoracic spine has ruled out discitis/osteomyelitis  - Echocardiogram was obtained due to bacteremia. No vegetations were seen on TTE.  --Infectious disease has been consulted. They have recommended TEE on 09/29/2024. They feel that endocarditis is likely and also have serious concern for infection of the pacer leads. The patient will require a miminum of 6  weeks of IV antibiotics. The patient has been switched to high dose ampicillin , ceftriaxone , and tigan .   History of chronic back pain -History of chronic back pain with sciatica. MRI lumbar and thoracic spine have ruled out infectious process in the spine. 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   Hypokalemia -Resolved   History of aortic stenosis s/p bioprosthesis aortic valve placement -Physical exam no evidence of murmur or regurgitation.  Continue cardiac monitoring. No vegetations seen on TTE. TEE planned for 09/29/2024   History of paroxysmal atrial fibrillation History of AV block status post pacemaker -EKG showing atrial fibrillation with premature ventricular complex heart rate 96.  Continue Eliquis  and Lopressor  50 mg twice daily. There is concern for infection of pacer leads. If leads cannot be removed the patient may require life long suppressive therapy.   Chronic diastolic heart failure History of essential hypertension -Blood pressure in good range.  Holding amlodipine  and spironolactone.  Currently continue Lopressor  50 mg twice daily.   History of CAD -Continue Eliquis , statin and Lopressor .   Insulin -dependent DM type II - Continue Jardiance  and heartedly carb modified diet.  Continue sliding scale insulin  with mealtime.     DVT prophylaxis:  Eliquis  Code Status:  Full Code Diet: Heart healthy carb modified diet Family Communication:   Family was present at bedside, at the time of interview. Opportunity was given to ask question and all questions were answered satisfactorily.  Disposition Plan: Need to follow with blood culture result, MRI lumbar and thoracic spine and echocardiogram results. Consults: Infectious disease       Subjective: The patient is resting comfortably. He states that he is feeling a little better.  Physical Exam: Vitals:   09/26/24 2004 09/27/24 0400 09/27/24 0928 09/27/24 1644  BP: (!) 160/86 (!) 142/69 (!) 150/74 (!) 156/75  Pulse: 98 88 88 92  Resp: 19 18 18 16   Temp: 98.7 F (37.1 C) 98.3 F (36.8 C) 97.6 F (36.4 C) 98.1 F (36.7 C)  TempSrc:  Oral    SpO2: 91% 91% 92% 92%   Exam:  Constitutional:  The patient is awake, alert, and oriented x 3. No acute distress. Respiratory:   No increased work of breathing. No wheezes, rales, or rhonchi No tactile fremitus Cardiovascular:  Regular rate and rhythm No murmurs, ectopy, or gallups. No lateral PMI. No thrills. Abdomen:  Abdomen is soft, non-tender, non-distended No hernias, masses, or organomegaly Normoactive bowel sounds.  Musculoskeletal:  No cyanosis, clubbing, or edema Skin:  No rashes, lesions, ulcers palpation of skin: no induration or nodules Neurologic:  CN 2-12 intact Sensation all 4 extremities intact Psychiatric:  Mental status Mood, affect appropriate Orientation to person, place, time  judgment and insight appear intact  Data Reviewed:  CBC, BMP, TTE  Family Communication: None available  Disposition: Status is: Inpatient Remains inpatient appropriate because: bacteremia  Planned Discharge Destination: Home    Time spent: 40 minutes  Author: Camilla Skeen, DO 09/27/2024 7:47 PM  For on call review [email or web address]  "

## 2024-09-28 ENCOUNTER — Inpatient Hospital Stay (HOSPITAL_COMMUNITY)

## 2024-09-28 DIAGNOSIS — A498 Other bacterial infections of unspecified site: Secondary | ICD-10-CM | POA: Diagnosis not present

## 2024-09-28 LAB — CBC WITH DIFFERENTIAL/PLATELET
Abs Immature Granulocytes: 0.15 K/uL — ABNORMAL HIGH (ref 0.00–0.07)
Basophils Absolute: 0.1 K/uL (ref 0.0–0.1)
Basophils Relative: 1 %
Eosinophils Absolute: 0.1 K/uL (ref 0.0–0.5)
Eosinophils Relative: 1 %
HCT: 40.5 % (ref 39.0–52.0)
Hemoglobin: 13.7 g/dL (ref 13.0–17.0)
Immature Granulocytes: 1 %
Lymphocytes Relative: 7 %
Lymphs Abs: 1 K/uL (ref 0.7–4.0)
MCH: 30.9 pg (ref 26.0–34.0)
MCHC: 33.8 g/dL (ref 30.0–36.0)
MCV: 91.2 fL (ref 80.0–100.0)
Monocytes Absolute: 1.7 K/uL — ABNORMAL HIGH (ref 0.1–1.0)
Monocytes Relative: 13 %
Neutro Abs: 10.4 K/uL — ABNORMAL HIGH (ref 1.7–7.7)
Neutrophils Relative %: 77 %
Platelets: 194 K/uL (ref 150–400)
RBC: 4.44 MIL/uL (ref 4.22–5.81)
RDW: 14 % (ref 11.5–15.5)
WBC: 13.4 K/uL — ABNORMAL HIGH (ref 4.0–10.5)
nRBC: 0 % (ref 0.0–0.2)

## 2024-09-28 LAB — GLUCOSE, CAPILLARY
Glucose-Capillary: 78 mg/dL (ref 70–99)
Glucose-Capillary: 117 mg/dL — ABNORMAL HIGH (ref 70–99)
Glucose-Capillary: 106 mg/dL — ABNORMAL HIGH (ref 70–99)
Glucose-Capillary: 149 mg/dL — ABNORMAL HIGH (ref 70–99)
Glucose-Capillary: 133 mg/dL — ABNORMAL HIGH (ref 70–99)

## 2024-09-28 LAB — BASIC METABOLIC PANEL WITH GFR
Anion gap: 15 (ref 5–15)
BUN: 13 mg/dL (ref 8–23)
CO2: 21 mmol/L — ABNORMAL LOW (ref 22–32)
Calcium: 8.1 mg/dL — ABNORMAL LOW (ref 8.9–10.3)
Chloride: 99 mmol/L (ref 98–111)
Creatinine, Ser: 0.55 mg/dL — ABNORMAL LOW (ref 0.61–1.24)
GFR, Estimated: >60 mL/min
Glucose, Bld: 86 mg/dL (ref 70–99)
Potassium: 3.9 mmol/L (ref 3.5–5.1)
Sodium: 135 mmol/L (ref 135–145)

## 2024-09-28 MED ORDER — ALUM & MAG HYDROXIDE-SIMETH 200-200-20 MG/5ML PO SUSP
30.0000 mL | Freq: Four times a day (QID) | ORAL | Status: DC | PRN
  Administered 2024-09-28 – 2024-09-30 (×2): 30 mL via ORAL
  Filled 2024-09-28 (×2): qty 30

## 2024-09-28 MED ORDER — ENSURE PLUS HIGH PROTEIN PO LIQD
237.0000 mL | Freq: Two times a day (BID) | ORAL | Status: DC
  Administered 2024-09-28 – 2024-10-03 (×3): 237 mL via ORAL

## 2024-09-28 NOTE — H&P (View-Only) (Signed)
 " [name] DOB: 1946-03-01 DOA: 09/24/2024     3 DOS: the patient was seen and examined on 09/28/2024   Brief hospital course: The patient is a 79 yr old man who presented to [facility] ED on 09/25/2023 with complaints of having felt badly since 09/07/2024. He states that he had a fever for 2 weeks with generalized weakness and brain fog. He then felt better for a couple of days, then he had another fever of 102 and came to the ED. He is also complaining of worsening back pain.   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   In the ED the patient was hemodynamically stable. He had WBC of 13, Na was 132, and K was 3.2. Lipase was elevated at 148, and lactic acid was normal. Blood cultures x 2 were collected and have grown out enterococcus faecalis. The patient has been continued on zosyn  and vancomycin . ID has been consulted and echocardiogram has been ordered. MRI of the T and L spine was performed. It has demonstrated No evidence of lumbar spine infection, diffuse disc and facet degeneration without high-grade spinal stenosis, there is also moderate to severe right and moderate left neural foraminal stenosis at L5-S1. There is also mild to moderal lateral recess stenosis at L3-L4.   The patient's blood cultures have grown out enterococcus faecalis. Repeat blood cultures were drawn today. Infectious disease was consulted. The patient is currently receiving IV Zosyn .   Infectious disease has been consulted. They have recommended TEE on 09/29/2024. They feel that endocarditis is likely and also have serious concern for infection of the pacer leads.  The patient will require a miminum of 6 weeks of IV antibiotics. The patient has been switched to high dose ampicillin , ceftriaxone , and tigan .  Assessment and Plan: Principal Problem:   Fever of unknown origin Active Problems:   S/P bioprosthetic AVR - 21 mm Celestia Pod, Dr. Fleeta Trigt 09/2010   Status post placement of cardiac pacemaker   Type 2 diabetes mellitus without complication, without long-term current use of insulin  (HCC)   Essential hypertension   Paroxysmal atrial fibrillation (HCC)   History of complete AV block   Aortic stenosis s/p aortic valve placement   History of CAD (coronary artery disease)   Non-insulin  dependent type 2 diabetes mellitus (HCC)   Hypokalemia   Assessment and Plan: Fever of unknown origin - Enterococcus faecalis Bacteremia -Present emergency department complaining of fever for 3 weeks with associated brain fog and generalized weakness..  Denies nausea, vomiting, neck pain/neck stiffness, headache, cough, congestion.  At presentation to ED patient is hemodynamically stable.  Afebrile.  CBC showing leukocytosis.  Unable to identify source of infection yet.  However patient reported that at the baseline he has chronic back pain but the pain has been progressively getting worse recently. -  MRI of the lumbar spine and MRI of the thoracic spine has ruled out discitis/osteomyelitis  - Echocardiogram was obtained due to bacteremia. No vegetations were seen on TTE.  --Infectious disease has been consulted. They have recommended TEE on 09/29/2024. They feel that endocarditis is likely and also have serious concern for infection of the pacer leads. The patient will require a miminum of 6  weeks of IV antibiotics. The patient has been switched to high dose ampicillin , ceftriaxone , and tigan . --WBC stable at 13.4. Monitor.   History of chronic back pain -History of chronic back pain with sciatica. MRI lumbar and thoracic spine have ruled out infectious process  in the spine. 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   Hypokalemia -Resolved   History of aortic stenosis s/p bioprosthesis aortic valve placement -Physical exam no evidence of murmur or regurgitation.  Continue cardiac monitoring. No vegetations seen on TTE. TEE planned for 09/29/2024   History of paroxysmal atrial fibrillation History of AV block status post pacemaker -EKG showing atrial fibrillation with premature ventricular complex heart rate 96.  Continue Eliquis  and Lopressor  50 mg twice daily. There is concern for infection of pacer leads. If leads cannot be removed the patient may require life long suppressive therapy.   Chronic diastolic heart failure History of essential hypertension -Blood pressure in good range.  Holding amlodipine  and spironolactone.  Currently continue Lopressor  50 mg twice daily.   History of CAD -Continue Eliquis , statin and Lopressor .   Insulin -dependent DM type II - Continue Jardiance  and heartedly carb modified diet.  Continue sliding scale insulin  with mealtime.     DVT prophylaxis:  Eliquis  Code Status:  Full Code Diet: Heart healthy carb modified diet Family Communication:   Family was present at bedside, at the time of interview. Opportunity was given to ask question and all questions were answered satisfactorily.  Disposition Plan: Need to follow with blood culture result, MRI lumbar and thoracic spine and echocardiogram results. Consults: Infectious disease       Subjective: The patient is resting comfortably. He states that he is feeling a little better.  Physical Exam: Vitals:   09/27/24 1644 09/27/24 2125 09/28/24 0752 09/28/24 1548  BP: (!) 156/75 (!) 160/76 (!) 149/79 (!) 154/77  Pulse: 92 100 96 98  Resp: 16 16 20 18   Temp: 98.1 F (36.7 C) 98.7 F (37.1 C) 97.9 F (36.6 C) 98.4 F (36.9 C)  TempSrc:  Oral    SpO2: 92% 94% 94% 92%   Exam:  Constitutional:  The patient is awake, alert, and oriented x 3. No  acute distress. Respiratory:  No increased work of breathing. No wheezes, rales, or rhonchi No tactile fremitus Cardiovascular:  Regular rate and rhythm No murmurs, ectopy, or gallups. No lateral PMI. No thrills. Abdomen:  Abdomen is soft, non-tender, non-distended No hernias, masses, or organomegaly Normoactive bowel sounds.  Musculoskeletal:  No cyanosis, clubbing, or edema Skin:  No rashes, lesions, ulcers palpation of skin: no induration or nodules Neurologic:  CN 2-12 intact Sensation all 4 extremities intact Psychiatric:  Mental status Mood, affect appropriate Orientation to person, place, time  judgment and insight appear intact  Data Reviewed:  CBC, BMP, TTE  Family Communication: None available  Disposition: Status is: Inpatient Remains inpatient appropriate because: bacteremia  Planned Discharge Destination: Home    Time spent: 40 minutes  Author: Brigida Bureau, DO 09/28/2024 6:27 PM  For on call review [email or web address]  "

## 2024-09-28 NOTE — Progress Notes (Signed)
 PT Cancellation Note  Patient Details Name: [name] [name] MRN: [id number] DOB: 11-Apr-1946   Cancelled Treatment:    Reason Eval/Treat Not Completed: Patient at procedure or test/unavailable  Rolling out to a procedure upon PT arrival;   Will follow up later today as time allows;  Otherwise, will follow up for PT tomorrow;   Thank you,  Silvano Currier, PT  Acute Rehabilitation Services Office [phone]    Silvano VEAR Currier 09/28/2024, 11:12 AM

## 2024-09-28 NOTE — Plan of Care (Signed)
  Problem: Fluid Volume: Goal: Ability to maintain a balanced intake and output will improve Outcome: Progressing   Problem: Health Behavior/Discharge Planning: Goal: Ability to identify and utilize available resources and services will improve Outcome: Progressing   Problem: Nutritional: Goal: Maintenance of adequate nutrition will improve Outcome: Progressing

## 2024-09-28 NOTE — Progress Notes (Signed)
 " Progress Note   [name] [name] FMW:[id number] DOB: 1946-03-01 DOA: 09/24/2024     3 DOS: the patient was seen and examined on 09/28/2024   Brief hospital course: The patient is a 79 yr old man who presented to [facility] ED on 09/25/2023 with complaints of having felt badly since 09/07/2024. He states that he had a fever for 2 weeks with generalized weakness and brain fog. He then felt better for a couple of days, then he had another fever of 102 and came to the ED. He is also complaining of worsening back pain.   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   In the ED the patient was hemodynamically stable. He had WBC of 13, Na was 132, and K was 3.2. Lipase was elevated at 148, and lactic acid was normal. Blood cultures x 2 were collected and have grown out enterococcus faecalis. The patient has been continued on zosyn  and vancomycin . ID has been consulted and echocardiogram has been ordered. MRI of the T and L spine was performed. It has demonstrated No evidence of lumbar spine infection, diffuse disc and facet degeneration without high-grade spinal stenosis, there is also moderate to severe right and moderate left neural foraminal stenosis at L5-S1. There is also mild to moderal lateral recess stenosis at L3-L4.   The patient's blood cultures have grown out enterococcus faecalis. Repeat blood cultures were drawn today. Infectious disease was consulted. The patient is currently receiving IV Zosyn .   Infectious disease has been consulted. They have recommended TEE on 09/29/2024. They feel that endocarditis is likely and also have serious concern for infection of the pacer leads.  The patient will require a miminum of 6 weeks of IV antibiotics. The patient has been switched to high dose ampicillin , ceftriaxone , and tigan .  Assessment and Plan: Principal Problem:   Fever of unknown origin Active Problems:   S/P bioprosthetic AVR - 21 mm [facility], Dr. Fleeta Trigt 09/2010   Status post placement of cardiac pacemaker   Type 2 diabetes mellitus without complication, without long-term current use of insulin  (HCC)   Essential hypertension   Paroxysmal atrial fibrillation (HCC)   History of complete AV block   Aortic stenosis s/p aortic valve placement   History of CAD (coronary artery disease)   Non-insulin  dependent type 2 diabetes mellitus (HCC)   Hypokalemia   Assessment and Plan: Fever of unknown origin - Enterococcus faecalis Bacteremia -Present emergency department complaining of fever for 3 weeks with associated brain fog and generalized weakness..  Denies nausea, vomiting, neck pain/neck stiffness, headache, cough, congestion.  At presentation to ED patient is hemodynamically stable.  Afebrile.  CBC showing leukocytosis.  Unable to identify source of infection yet.  However patient reported that at the baseline he has chronic back pain but the pain has been progressively getting worse recently. -  MRI of the lumbar spine and MRI of the thoracic spine has ruled out discitis/osteomyelitis  - Echocardiogram was obtained due to bacteremia. No vegetations were seen on TTE.  --Infectious disease has been consulted. They have recommended TEE on 09/29/2024. They feel that endocarditis is likely and also have serious concern for infection of the pacer leads. The patient will require a miminum of 6  weeks of IV antibiotics. The patient has been switched to high dose ampicillin , ceftriaxone , and tigan . --WBC stable at 13.4. Monitor.   History of chronic back pain -History of chronic back pain with sciatica. MRI lumbar and thoracic spine have ruled out infectious process  in the spine. 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   Hypokalemia -Resolved   History of aortic stenosis s/p bioprosthesis aortic valve placement -Physical exam no evidence of murmur or regurgitation.  Continue cardiac monitoring. No vegetations seen on TTE. TEE planned for 09/29/2024   History of paroxysmal atrial fibrillation History of AV block status post pacemaker -EKG showing atrial fibrillation with premature ventricular complex heart rate 96.  Continue Eliquis  and Lopressor  50 mg twice daily. There is concern for infection of pacer leads. If leads cannot be removed the patient may require life long suppressive therapy.   Chronic diastolic heart failure History of essential hypertension -Blood pressure in good range.  Holding amlodipine  and spironolactone.  Currently continue Lopressor  50 mg twice daily.   History of CAD -Continue Eliquis , statin and Lopressor .   Insulin -dependent DM type II - Continue Jardiance  and heartedly carb modified diet.  Continue sliding scale insulin  with mealtime.     DVT prophylaxis:  Eliquis  Code Status:  Full Code Diet: Heart healthy carb modified diet Family Communication:   Family was present at bedside, at the time of interview. Opportunity was given to ask question and all questions were answered satisfactorily.  Disposition Plan: Need to follow with blood culture result, MRI lumbar and thoracic spine and echocardiogram results. Consults: Infectious disease       Subjective: The patient is resting comfortably. He states that he is feeling a little better.  Physical Exam: Vitals:   09/27/24 1644 09/27/24 2125 09/28/24 0752 09/28/24 1548  BP: (!) 156/75 (!) 160/76 (!) 149/79 (!) 154/77  Pulse: 92 100 96 98  Resp: 16 16 20 18   Temp: 98.1 F (36.7 C) 98.7 F (37.1 C) 97.9 F (36.6 C) 98.4 F (36.9 C)  TempSrc:  Oral    SpO2: 92% 94% 94% 92%   Exam:  Constitutional:  The patient is awake, alert, and oriented x 3. No  acute distress. Respiratory:  No increased work of breathing. No wheezes, rales, or rhonchi No tactile fremitus Cardiovascular:  Regular rate and rhythm No murmurs, ectopy, or gallups. No lateral PMI. No thrills. Abdomen:  Abdomen is soft, non-tender, non-distended No hernias, masses, or organomegaly Normoactive bowel sounds.  Musculoskeletal:  No cyanosis, clubbing, or edema Skin:  No rashes, lesions, ulcers palpation of skin: no induration or nodules Neurologic:  CN 2-12 intact Sensation all 4 extremities intact Psychiatric:  Mental status Mood, affect appropriate Orientation to person, place, time  judgment and insight appear intact  Data Reviewed:  CBC, BMP, TTE  Family Communication: None available  Disposition: Status is: Inpatient Remains inpatient appropriate because: bacteremia  Planned Discharge Destination: Home    Time spent: 40 minutes  Author: Brigida Bureau, DO 09/28/2024 6:27 PM  For on call review [email or web address].  "

## 2024-09-28 NOTE — Evaluation (Signed)
 Physical Therapy Evaluation Patient Details Name: [name] [name] MRN: [id number] DOB: 11-13-1945 Today's Date: 09/28/2024  History of Present Illness  79 y.o. male presents 09/24/24 with c/o fever, generalized weakness, and brain fog since 09/07/24. Seen at [facility] ED found to have hypotension and AKI. Pt with c/o back pain, MRIs performed of the thoracic and lumbar spine without evidence of infection.  Blood cultures now positive for Enterococcus faecalis. TEE planned 09/29/24. 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  Clinical Impression   Pt admitted with above diagnosis. Lives at home alone, in a 2-level home with a few steps to enter; Prior to admission, pt was able to manage independently, walk without assistive device, driving; Presents to PT with significant weakness, decr functional endurance, decr balance, incr fall risk, a decline in functional status compared to her baseline;  Needs mod assist to sit up to EOB, Mod assist to stand from EOB, and 2 person assist for safety with pivot steps bed to recliner with RW; Anticipate he will need post-acute rehab to maximize independence and safety with mobility and ADLs; Patient will benefit from continued inpatient follow up therapy, <3 hours/day; Pt currently with functional limitations due to the deficits listed below (see PT Problem List). Pt will benefit from skilled PT to increase their independence and safety with mobility to allow discharge to the venue listed below.           If plan is discharge home, recommend the following: A lot of help with walking and/or transfers;A lot of help with bathing/dressing/bathroom;Assist for transportation;Help with stairs or ramp for entrance   Can travel by private vehicle    (perhaps soon)    Equipment  Recommendations Rolling walker (2 wheels);BSC/3in1  Recommendations for Other Services       Functional Status Assessment Patient has had a recent decline in their functional status and demonstrates the ability to make significant improvements in function in a reasonable and predictable amount of time.     Precautions / Restrictions Precautions Precautions: Fall Recall of Precautions/Restrictions: Intact Restrictions Weight Bearing Restrictions Per Provider Order: No      Mobility  Bed Mobility Overal bed mobility: Needs Assistance Bed Mobility: Rolling, Sidelying to Sit Rolling: Min assist, Used rails Sidelying to sit: Mod assist       General bed mobility comments: Pt required increased time to come to EOB on R side. Pt required mod assist to help LEs off of teh bed, and support for trunk when coming to sit    Transfers Overall transfer level: Needs assistance Equipment used: Rolling walker (2 wheels) Transfers: Sit to/from Stand, Bed to chair/wheelchair/BSC Sit to Stand: Mod assist, +2 safety/equipment   Step pivot transfers: Min assist, +2 physical assistance, +2 safety/equipment       General transfer comment: Mod assist to rise from EOB; min assist for pt support and RW management    Ambulation/Gait                  Stairs            Wheelchair Mobility     Tilt Bed    Modified Rankin (Stroke Patients Only)       Balance  Pertinent Vitals/Pain Pain Assessment Pain Assessment: Faces Faces Pain Scale: Hurts little more Pain Location: back Pain Descriptors / Indicators: Discomfort, Grimacing, Guarding Pain Intervention(s): Monitored during session, Limited activity within patient's tolerance    Home Living Family/patient expects to be discharged to:: Private residence Living Arrangements: Alone Available Help at Discharge: Family;Available PRN/intermittently Type of  Home: House Home Access: Stairs to enter   Entrance Stairs-Number of Steps: 3   Home Layout: One level Home Equipment: Agricultural Consultant (2 wheels);Cane - single point;BSC/3in1      Prior Function Prior Level of Function : Independent/Modified Independent;Driving             Mobility Comments: Ind ADLs Comments: Ind     Extremity/Trunk Assessment   Upper Extremity Assessment Upper Extremity Assessment: Defer to OT evaluation    Lower Extremity Assessment Lower Extremity Assessment: Generalized weakness       Communication   Communication Communication: No apparent difficulties    Cognition Arousal: Alert Behavior During Therapy: WFL for tasks assessed/performed, Anxious   PT - Cognitive impairments: Memory, Attention                         Following commands: Intact       Cueing Cueing Techniques: Verbal cues, Visual cues, Gestural cues     General Comments General comments (skin integrity, edema, etc.): Reported dizziness with standing; see vitals flowsheets for serial BPs    Exercises     Assessment/Plan    PT Assessment Patient needs continued PT services  PT Problem List Decreased strength;Decreased range of motion;Decreased activity tolerance;Decreased balance;Decreased mobility;Decreased coordination;Decreased cognition;Decreased knowledge of use of DME;Decreased safety awareness;Decreased knowledge of precautions;Cardiopulmonary status limiting activity       PT Treatment Interventions DME instruction;Gait training;Stair training;Functional mobility training;Therapeutic activities;Therapeutic exercise;Balance training;Neuromuscular re-education;Cognitive remediation;Patient/family education;Manual techniques;Modalities    PT Goals (Current goals can be found in the Care Plan section)  Acute Rehab PT Goals Patient Stated Goal: get better adn back to independence PT Goal Formulation: With patient Time For Goal Achievement:  10/12/24 Potential to Achieve Goals: Good    Frequency Min 2X/week     Co-evaluation               AM-PAC PT 6 Clicks Mobility  Outcome Measure Help needed turning from your back to your side while in a flat bed without using bedrails?: A Little Help needed moving from lying on your back to sitting on the side of a flat bed without using bedrails?: A Lot Help needed moving to and from a bed to a chair (including a wheelchair)?: A Lot Help needed standing up from a chair using your arms (e.g., wheelchair or bedside chair)?: A Lot Help needed to walk in hospital room?: A Lot Help needed climbing 3-5 steps with a railing? : Total 6 Click Score: 12    End of Session Equipment Utilized During Treatment: Gait belt Activity Tolerance: Patient tolerated treatment well Patient left: in chair;with call bell/phone within reach;with chair alarm set;with family/visitor present Nurse Communication: Mobility status (Pt reporting heartburn) PT Visit Diagnosis: Unsteadiness on feet (R26.81);Other abnormalities of gait and mobility (R26.89);Muscle weakness (generalized) (M62.81);Difficulty in walking, not elsewhere classified (R26.2)    Time: 8799-8762 PT Time Calculation (min) (ACUTE ONLY): 37 min   Charges:   PT Evaluation $PT Eval Moderate Complexity: 1 Mod PT Treatments $Therapeutic Activity: 8-22 mins PT General Charges $$ ACUTE PT VISIT: 1 Visit  Silvano Currier, PT  Acute Rehabilitation Services Office [phone] Secure Chat welcomed   Silvano VEAR Currier 09/28/2024, 3:20 PM

## 2024-09-29 ENCOUNTER — Inpatient Hospital Stay (HOSPITAL_COMMUNITY)

## 2024-09-29 ENCOUNTER — Inpatient Hospital Stay (HOSPITAL_COMMUNITY): Admitting: Anesthesiology

## 2024-09-29 ENCOUNTER — Encounter (HOSPITAL_COMMUNITY): Payer: Self-pay

## 2024-09-29 ENCOUNTER — Encounter (HOSPITAL_COMMUNITY)
Admission: EM | Disposition: A | Discharge status: Skilled Nursing Facility | Payer: Self-pay | Source: Home / Self Care | Attending: Internal Medicine

## 2024-09-29 DIAGNOSIS — I2510 Atherosclerotic heart disease of native coronary artery without angina pectoris: Secondary | ICD-10-CM

## 2024-09-29 DIAGNOSIS — R7881 Bacteremia: Secondary | ICD-10-CM | POA: Diagnosis not present

## 2024-09-29 DIAGNOSIS — I330 Acute and subacute infective endocarditis: Secondary | ICD-10-CM | POA: Diagnosis not present

## 2024-09-29 DIAGNOSIS — T827XXA Infection and inflammatory reaction due to other cardiac and vascular devices, implants and grafts, initial encounter: Secondary | ICD-10-CM

## 2024-09-29 DIAGNOSIS — Z950 Presence of cardiac pacemaker: Secondary | ICD-10-CM | POA: Diagnosis not present

## 2024-09-29 DIAGNOSIS — Z952 Presence of prosthetic heart valve: Secondary | ICD-10-CM | POA: Diagnosis not present

## 2024-09-29 DIAGNOSIS — I10 Essential (primary) hypertension: Secondary | ICD-10-CM | POA: Diagnosis not present

## 2024-09-29 DIAGNOSIS — Z87891 Personal history of nicotine dependence: Secondary | ICD-10-CM

## 2024-09-29 DIAGNOSIS — I350 Nonrheumatic aortic (valve) stenosis: Secondary | ICD-10-CM | POA: Diagnosis not present

## 2024-09-29 DIAGNOSIS — A498 Other bacterial infections of unspecified site: Secondary | ICD-10-CM | POA: Diagnosis not present

## 2024-09-29 HISTORY — PX: TRANSESOPHAGEAL ECHOCARDIOGRAM (CATH LAB): EP1270

## 2024-09-29 LAB — CBC WITH DIFFERENTIAL/PLATELET
Abs Immature Granulocytes: 0.16 K/uL — ABNORMAL HIGH (ref 0.00–0.07)
Basophils Absolute: 0.1 K/uL (ref 0.0–0.1)
Basophils Relative: 0 %
Eosinophils Absolute: 0 K/uL (ref 0.0–0.5)
Eosinophils Relative: 0 %
HCT: 40.1 % (ref 39.0–52.0)
Hemoglobin: 13.9 g/dL (ref 13.0–17.0)
Immature Granulocytes: 1 %
Lymphocytes Relative: 5 %
Lymphs Abs: 0.7 K/uL (ref 0.7–4.0)
MCH: 31.1 pg (ref 26.0–34.0)
MCHC: 34.7 g/dL (ref 30.0–36.0)
MCV: 89.7 fL (ref 80.0–100.0)
Monocytes Absolute: 1.6 K/uL — ABNORMAL HIGH (ref 0.1–1.0)
Monocytes Relative: 10 %
Neutro Abs: 13.7 K/uL — ABNORMAL HIGH (ref 1.7–7.7)
Neutrophils Relative %: 84 %
Platelets: 223 K/uL (ref 150–400)
RBC: 4.47 MIL/uL (ref 4.22–5.81)
RDW: 13.8 % (ref 11.5–15.5)
WBC: 16.3 K/uL — ABNORMAL HIGH (ref 4.0–10.5)
nRBC: 0 % (ref 0.0–0.2)

## 2024-09-29 LAB — ECHO TEE
AR max vel: 1.25 cm2
AV Area VTI: 1.43 cm2
AV Area mean vel: 1.51 cm2
AV Mean grad: 26 mmHg
AV Peak grad: 47.3 mmHg
Ao pk vel: 3.44 m/s
MV VTI: 2.41 cm2

## 2024-09-29 LAB — BASIC METABOLIC PANEL WITH GFR
Anion gap: 16 — ABNORMAL HIGH (ref 5–15)
BUN: 12 mg/dL (ref 8–23)
CO2: 20 mmol/L — ABNORMAL LOW (ref 22–32)
Calcium: 7.9 mg/dL — ABNORMAL LOW (ref 8.9–10.3)
Chloride: 97 mmol/L — ABNORMAL LOW (ref 98–111)
Creatinine, Ser: 0.5 mg/dL — ABNORMAL LOW (ref 0.61–1.24)
GFR, Estimated: >60 mL/min
Glucose, Bld: 107 mg/dL — ABNORMAL HIGH (ref 70–99)
Potassium: 3 mmol/L — ABNORMAL LOW (ref 3.5–5.1)
Sodium: 133 mmol/L — ABNORMAL LOW (ref 135–145)

## 2024-09-29 LAB — GLUCOSE, CAPILLARY
Glucose-Capillary: 161 mg/dL — ABNORMAL HIGH (ref 70–99)
Glucose-Capillary: 130 mg/dL — ABNORMAL HIGH (ref 70–99)
Glucose-Capillary: 116 mg/dL — ABNORMAL HIGH (ref 70–99)
Glucose-Capillary: 129 mg/dL — ABNORMAL HIGH (ref 70–99)

## 2024-09-29 MED ORDER — PROPOFOL 10 MG/ML IV BOLUS
INTRAVENOUS | Status: DC | PRN
  Administered 2024-09-29: 40 mg via INTRAVENOUS
  Administered 2024-09-29: 20 mg via INTRAVENOUS
  Administered 2024-09-29: 50 mg via INTRAVENOUS
  Administered 2024-09-29 (×2): 20 mg via INTRAVENOUS
  Administered 2024-09-29: 40 mg via INTRAVENOUS
  Administered 2024-09-29: 10 mg via INTRAVENOUS

## 2024-09-29 MED ORDER — PANTOPRAZOLE SODIUM 40 MG PO TBEC
40.0000 mg | DELAYED_RELEASE_TABLET | Freq: Every day | ORAL | Status: DC
  Administered 2024-09-29 – 2024-10-03 (×5): 40 mg via ORAL
  Filled 2024-09-29 (×5): qty 1

## 2024-09-29 MED ORDER — LIDOCAINE 2% (20 MG/ML) 5 ML SYRINGE
INTRAMUSCULAR | Status: DC | PRN
  Administered 2024-09-29: 50 mg via INTRAVENOUS

## 2024-09-29 MED ORDER — POTASSIUM CHLORIDE CRYS ER 20 MEQ PO TBCR
40.0000 meq | EXTENDED_RELEASE_TABLET | Freq: Once | ORAL | Status: AC
  Administered 2024-09-29: 40 meq via ORAL
  Filled 2024-09-29: qty 2

## 2024-09-29 MED ORDER — GADOBUTROL 1 MMOL/ML IV SOLN
8.5000 mL | Freq: Once | INTRAVENOUS | Status: AC | PRN
  Administered 2024-09-29: 8.5 mL via INTRAVENOUS

## 2024-09-29 NOTE — Interval H&P Note (Signed)
 History and Physical Interval Note:  09/29/2024 10:16 AM  [name] [name]  has presented today for surgery, with the diagnosis of Bacteremia with device in place.  The various methods of treatment have been discussed with the patient and family. After consideration of risks, benefits and other options for treatment, the patient has consented to  Procedures: TRANSESOPHAGEAL ECHOCARDIOGRAM (N/A) as a surgical intervention.  The patient's history has been reviewed, patient examined, no change in status, stable for surgery.  I have reviewed the patient's chart and labs.  Questions were answered to the patient's satisfaction.     Tonnia Bardin H Azobou Tonleu

## 2024-09-29 NOTE — Progress Notes (Signed)
 Pt is having severe nausea, gave Tigan  IM, I can hear his stomach gurgling...he states he is belching and has a bad taste and that is making it worse-- This is new for the pt, he also had 1 large watery bm overnight and is currently incontinent

## 2024-09-29 NOTE — TOC Initial Note (Signed)
 Transition of Care [facility]) - Initial/Assessment Note    Patient Details  Name: [name] [name] MRN: [id number] Date of Birth: 1946/07/10  [facility] CM/SW Contact:    Bridget Cordella Simmonds, LCSW Phone Number: 09/29/2024, 3:04 PM  Clinical Narrative:       CSW met with pt and brothers Gerlene and Marcey.  Pt from home alone, no current services.  Pt has not been fully oriented but today able to fully participate in conversation about DC plan.  Discussed PT recommendation for SNF, unclear DC date currently.  Pt and brothers all in agreement with plan for SNF, medicare choice document provided.  ICM will continue to follow.  Referral has not been sent out for SNF yet.             Expected Discharge Plan: Skilled Nursing Facility Barriers to Discharge: Continued Medical Work up, SNF Pending bed offer   Patient Goals and CMS Choice Patient states their goals for this hospitalization and ongoing recovery are:: normal life CMS Medicare.gov Compare Post Acute Care list provided to:: Patient Represenative (must comment) (brother Gerlene)        Expected Discharge Plan and Services In-house Referral: Clinical Social Work   Post Acute Care Choice: Skilled Nursing Facility Living arrangements for the past 2 months: Single Family Home                                      Prior Living Arrangements/Services Living arrangements for the past 2 months: Single Family Home Lives with:: Self Patient language and need for interpreter reviewed:: Yes Do you feel safe going back to the place where you live?: Yes      Need for Family Participation in Patient Care: Yes (Comment) Care giver support system in place?: Yes (comment) Current home services: Other (comment) (no current services) Criminal Activity/Legal Involvement Pertinent to Current Situation/Hospitalization: No - Comment as needed  Activities of Daily Living   ADL Screening (condition at time of admission) Independently  performs ADLs?: No Does the patient have a NEW difficulty with bathing/dressing/toileting/self-feeding that is expected to last >3 days?: Yes (Initiates electronic notice to provider for possible OT consult) Does the patient have a NEW difficulty with getting in/out of bed, walking, or climbing stairs that is expected to last >3 days?: Yes (Initiates electronic notice to provider for possible PT consult) Does the patient have a NEW difficulty with communication that is expected to last >3 days?: No Is the patient deaf or have difficulty hearing?: No Does the patient have difficulty seeing, even when wearing glasses/contacts?: No Does the patient have difficulty concentrating, remembering, or making decisions?: Yes  Permission Sought/Granted Permission sought to share information with : Family Supports Permission granted to share information with : Yes, Verbal Permission Granted  Share Information with NAME: brothers Gerlene and Marcey  Permission granted to share info w AGENCY: SNF        Emotional Assessment Appearance:: Appears stated age Attitude/Demeanor/Rapport: Engaged Affect (typically observed): Appropriate, Pleasant Orientation: : Oriented to Self, Oriented to Place, Oriented to  Time, Oriented to Situation      Admission diagnosis:  Fever of unknown origin [R50.9] Fever, unspecified fever cause [R50.9] Patient Active Problem List   Diagnosis Date Noted   Enterococcus faecalis infection 09/26/2024   Fever of unknown origin 09/25/2024   History of complete AV block 09/25/2024   Aortic stenosis s/p aortic valve placement  09/25/2024   History of CAD (coronary artery disease) 09/25/2024   Non-insulin  dependent type 2 diabetes mellitus (HCC) 09/25/2024   Hypokalemia 09/25/2024   Malignant neoplasm of prostate (HCC) 10/25/2021   Pacemaker battery depletion 05/23/2021   Dyslipidemia 12/27/2020   History of colon polyps 07/04/2018   Constipation 07/04/2018   Hypertensive  retinopathy of both eyes 12/25/2017   Arcus senilis, bilateral 12/25/2017   Nuclear sclerotic cataract of both eyes 12/21/2016   Acute bilateral low back pain without sciatica 10/25/2016   S/P bioprosthetic AVR - 21 mm [facility], Dr. Fleeta Trigt 09/2010 06/18/2013   Second degree AV block, Mobitz type I  06/18/2013   Status post placement of cardiac pacemaker 06/18/2013   Type 2 diabetes mellitus without complication, without long-term current use of insulin  (HCC) 06/18/2013   Mixed hyperlipidemia 06/18/2013   Essential hypertension 06/18/2013   Paroxysmal atrial fibrillation (HCC) 06/18/2013   PCP:  [name], MD Pharmacy:   [id number] [place], [place] - [address] AT [facility] & [address] [place] [address] Phone: [phone] Fax: [phone]  [facility] - [place], [place] - [address] [place] [address] Phone: [phone] Fax: [phone]  [facility] - [place], [place] - [address] [facility] [address] [place] [address] Phone: [phone] Fax: [phone]     Social Drivers of Health (SDOH) Social History: SDOH Screenings   Food Insecurity: No Food Insecurity (09/25/2024)  Housing: Low Risk (09/25/2024)  Transportation Needs: No Transportation Needs (09/25/2024)  Utilities: Not At Risk (09/25/2024)  Social Connections: Socially Isolated (09/25/2024)  Tobacco Use: Medium Risk (09/25/2024)   SDOH Interventions:     Readmission Risk Interventions     No data to display

## 2024-09-29 NOTE — CV Procedure (Addendum)
" ° ° °  TRANSESOPHAGEAL ECHOCARDIOGRAM   NAME:  [name] [name]    MRN: [id number] DOB:  August 17, 1946    ADMIT DATE: 09/24/2024  INDICATIONS: Enterococcus Bacteremia  PROCEDURE:  Informed consent was obtained prior to the procedure. The risks, benefits and alternatives for the procedure were discussed and the patient comprehended these risks.  Risks include, but are not limited to, cough, sore throat, vomiting, nausea, somnolence, esophageal and stomach trauma or perforation, bleeding, low blood pressure, aspiration, pneumonia, infection, trauma to the teeth and death.    Procedural time out performed. The oropharynx was anesthetized with topical 1% benzocaine.    Anesthesia was administered by Dr. Corinne.   The transesophageal probe was inserted in the esophagus and stomach without difficulty and multiple views were obtained.   COMPLICATIONS:   There were no immediate complications.  KEY FINDINGS: Mobile thickening on bioprosthetic valve leaflet c/f endocarditis.  Possible Prosthetic valve stenosis- new from compared to prior echo Normal biventricular systolic function  Full report to follow. Further management per primary team.   Signed, Joelle DEL. Ren Ny, MD, [facility]  [facility]  10:51 AM  "

## 2024-09-29 NOTE — Progress Notes (Signed)
" °  Echocardiogram Echocardiogram Transesophageal has been performed.  Devora Ellouise SAUNDERS 09/29/2024, 11:57 AM "

## 2024-09-29 NOTE — Hospital Course (Addendum)
 SABRA

## 2024-09-29 NOTE — Anesthesia Preprocedure Evaluation (Signed)
"                                    Anesthesia Evaluation  Patient identified by MRN, date of birth, ID band Patient awake    Reviewed: Allergy & Precautions, NPO status , Patient's Chart, lab work & pertinent test results, reviewed documented beta blocker date and time   Airway Mallampati: II  TM Distance: >3 FB Neck ROM: Full    Dental  (+) Dental Advisory Given, Missing   Pulmonary former smoker   Pulmonary exam normal breath sounds clear to auscultation       Cardiovascular hypertension, Pt. on medications and Pt. on home beta blockers + CAD and + Past MI  + dysrhythmias (RBBB) Atrial Fibrillation and Ventricular Tachycardia + pacemaker + Valvular Problems/Murmurs (s/p AVR) AI and MR  Rhythm:Irregular Rate:Normal + Diastolic murmurs    Neuro/Psych negative neurological ROS  negative psych ROS   GI/Hepatic negative GI ROS, Neg liver ROS,,,  Endo/Other  diabetes, Type 2, Oral Hypoglycemic Agents    Renal/GU negative Renal ROS     Musculoskeletal negative musculoskeletal ROS (+)    Abdominal   Peds  Hematology  (+) Blood dyscrasia (Eliquis ) Bacteremia with device in place   Anesthesia Other Findings Day of surgery medications reviewed with the patient.  Reproductive/Obstetrics                              Anesthesia Physical Anesthesia Plan  ASA: 3  Anesthesia Plan: MAC   Post-op Pain Management:    Induction: Intravenous  PONV Risk Score and Plan: 1 and TIVA  Airway Management Planned: Natural Airway and Simple Face Mask  Additional Equipment:   Intra-op Plan:   Post-operative Plan:   Informed Consent: I have reviewed the patients History and Physical, chart, labs and discussed the procedure including the risks, benefits and alternatives for the proposed anesthesia with the patient or authorized representative who has indicated his/her understanding and acceptance.     Dental advisory given  Plan  Discussed with: CRNA and Anesthesiologist  Anesthesia Plan Comments:          Anesthesia Quick Evaluation  "

## 2024-09-29 NOTE — Plan of Care (Signed)
   Problem: Coping: Goal: Ability to adjust to condition or change in health will improve Outcome: Progressing   Problem: Nutritional: Goal: Maintenance of adequate nutrition will improve Outcome: Progressing

## 2024-09-29 NOTE — Progress Notes (Addendum)
 "        [facility]  Date of Admission:  09/24/2024      Total days of antibiotics 4           ASSESSMENT: [name] [name] is a 79 y.o. 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 TEE today results revealed no occult vegetations on leads or valve. No urinary symptoms. Did describe diarrhea that started shortly before hospitalization with fatigue/weakness/back pain that started at the end of December and progressed to have fevers / AMS for 4 days PTA. MRIs of the spine (T/L) were both negative for acute infectious changes. No obvious source of bacteremia at this time - ?translocation from gut.  EP to see re: bacteremia with device in place (placed 2011/2012).  *Enterococcus can be challenging to get rid of. With risk would recommend 6 weeks of dual therapy via PICC line one cleared. Discussed risk of stopping + surveillance vs ongoing oral antibiotic suppression after he completes the IV course.  - Continue IV ampicillin  + ceftriaxone   - Planning for dual lumen PICC line once blood cleared (hopefully tomorrow / Wednesday)  - FU EP recommendations  Slurring Speech -  Noted with family in the room. NCHCT was performed last night and unremarkable. Will order brain MRI to ensure no embolic findings. He seems oriented and has no unilateral weaknesses/symptoms otherwise.  - Brain MRI w/ and w/o   Medication Monitoring -  Creatinine normal range. Mild hyponatremia.  - Continue to monitor for any changes that would impact antibiotic recommendations.   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 Fleeta Trigt 09/2010   Status post placement of cardiac pacemaker   Type 2 diabetes mellitus without complication, without long-term current use of insulin  (HCC)   Essential hypertension   Paroxysmal atrial fibrillation (HCC)   Fever of unknown origin   History of complete AV block   Aortic stenosis s/p aortic valve placement   History of CAD (coronary artery disease)   Non-insulin  dependent type 2 diabetes mellitus (HCC)   Hypokalemia    apixaban   5 mg Oral BID   atorvastatin   40 mg Oral Daily   empagliflozin   10 mg Oral Daily   feeding supplement  237 mL Oral BID BM   insulin  aspart  0-6 Units Subcutaneous TID WC   metoprolol  tartrate  50 mg Oral BID   potassium chloride   40 mEq Oral Once   sodium chloride  flush  3 mL Intravenous Q12H   sodium chloride  flush  3 mL Intravenous Q12H    SUBJECTIVE: Family at the bedside. Doing well otherwise.   Review of Systems: Review of Systems  Constitutional:  Negative for chills and fever.  Respiratory: Negative.    Cardiovascular: Negative.   Genitourinary: Negative.   Skin:  Negative for rash.     Allergies[1]  OBJECTIVE: Vitals:   09/29/24 0912 09/29/24 1100 09/29/24 1115 09/29/24 1143  BP: (!) 153/90 106/71 101/68 122/86  Pulse: (!) 103 98 95 (!) 104  Resp: 20 15 14    Temp: 97.7 F (36.5 C)   98.2 F (36.8 C)  TempSrc: Temporal   Oral  SpO2: 92% 96% 96% 95%  Weight:      Height:       Body mass index is 27.06 kg/m.  Physical Exam Vitals reviewed.  HENT:      Mouth/Throat:     Mouth: No oral lesions.     Dentition: Normal dentition. No dental caries.  Eyes:     General: No scleral icterus. Cardiovascular:     Rate and Rhythm: Normal rate and regular rhythm.     Heart sounds: Normal heart sounds.  Pulmonary:     Effort: Pulmonary effort is normal.     Breath sounds: Normal breath sounds.  Abdominal:     General: There is no distension.     Palpations: Abdomen is soft.     Tenderness: There is no abdominal tenderness.  Lymphadenopathy:     Cervical: No cervical adenopathy.  Skin:    General: Skin is warm and dry.     Findings: No rash.  Neurological:     Mental Status: He is alert and oriented to person, place, and time.     Lab Results Lab Results  Component Value Date   WBC 16.3 (H) 09/29/2024   HGB 13.9 09/29/2024   HCT 40.1 09/29/2024   MCV 89.7 09/29/2024   PLT 223 09/29/2024    Lab Results  Component Value Date   CREATININE 0.50 (L) 09/29/2024   BUN 12 09/29/2024   NA 133 (L) 09/29/2024   K 3.0 (L) 09/29/2024   CL 97 (L) 09/29/2024   CO2 20 (L) 09/29/2024    Lab Results  Component Value Date   ALT 41 09/24/2024   AST 41 09/24/2024   ALKPHOS 89 09/24/2024   BILITOT 1.2 09/24/2024     Microbiology: Recent Results (from the past 240 hours)  Resp panel by RT-PCR (RSV, Flu A&B, Covid) Anterior Nasal Swab     Status: None   Collection Time: 09/22/24 12:55 PM   Specimen: Anterior Nasal Swab  Result Value Ref Range Status   SARS Coronavirus 2 by RT PCR NEGATIVE NEGATIVE Final    Comment: (NOTE) SARS-CoV-2 target nucleic acids are NOT DETECTED.  The SARS-CoV-2 RNA is generally detectable in upper respiratory specimens during the acute phase of infection. The lowest concentration of SARS-CoV-2 viral copies this assay can detect is 138 copies/mL. A negative result does not preclude SARS-Cov-2 infection and should not be used as the sole basis for treatment or other patient management decisions. A negative result  may occur with  improper specimen collection/handling, submission of specimen other than nasopharyngeal swab, presence of viral mutation(s) within the areas targeted by this assay, and inadequate number of viral copies(<138 copies/mL). A negative result must be combined with clinical observations, patient history, and epidemiological information. The expected result is Negative.  Fact Sheet for Patients:  bloggercourse.com  Fact Sheet for Healthcare Providers:  seriousbroker.it  This test is no t yet approved or cleared by the [place]  FDA and  has been authorized for detection and/or diagnosis of  SARS-CoV-2 by FDA under an Emergency Use Authorization (EUA). This EUA will remain  in effect (meaning this test can be used) for the duration of the COVID-19 declaration under Section 564(b)(1) of the Act, 21 U.S.C.section 360bbb-3(b)(1), unless the authorization is terminated  or revoked sooner.       Influenza A by PCR NEGATIVE NEGATIVE Final   Influenza B by PCR NEGATIVE NEGATIVE Final    Comment: (NOTE) The Xpert Xpress SARS-CoV-2/FLU/RSV plus assay is intended as an aid in the diagnosis of influenza from Nasopharyngeal swab specimens and should not be used as a sole basis for treatment. Nasal washings and aspirates are unacceptable for Xpert Xpress SARS-CoV-2/FLU/RSV testing.  Fact Sheet for Patients: bloggercourse.com  Fact Sheet for Healthcare Providers: seriousbroker.it  This test is not yet approved or cleared by the [place]  FDA and has been authorized for detection and/or diagnosis of SARS-CoV-2 by FDA under an Emergency Use Authorization (EUA). This EUA will remain in effect (meaning this test can be used) for the duration of the COVID-19 declaration under Section 564(b)(1) of the Act, 21 U.S.C. section 360bbb-3(b)(1), unless the authorization is terminated  or revoked.     Resp Syncytial Virus by PCR NEGATIVE NEGATIVE Final    Comment: (NOTE) Fact Sheet for Patients: bloggercourse.com  Fact Sheet for Healthcare Providers: seriousbroker.it  This test is not yet approved or cleared by the [place]  FDA and has been authorized for detection and/or diagnosis of SARS-CoV-2 by FDA under an Emergency Use Authorization (EUA). This EUA will remain in effect (meaning this test can be used) for the duration of the COVID-19 declaration under Section 564(b)(1) of the Act, 21 U.S.C. section 360bbb-3(b)(1), unless the authorization is terminated or revoked.  Performed at [facility], [address] [place], [place] [address]   Resp panel by RT-PCR (RSV, Flu A&B, Covid) Anterior Nasal Swab     Status: None   Collection Time: 09/24/24 11:10 PM   Specimen: Anterior Nasal Swab  Result Value Ref Range Status   SARS Coronavirus 2 by RT PCR NEGATIVE NEGATIVE Final   Influenza A by PCR NEGATIVE NEGATIVE Final   Influenza B by PCR NEGATIVE NEGATIVE Final    Comment: (NOTE) The Xpert Xpress SARS-CoV-2/FLU/RSV plus assay is intended as an aid in the diagnosis of influenza from Nasopharyngeal swab specimens and should not be used as a sole basis for treatment. Nasal washings and aspirates are unacceptable for Xpert Xpress SARS-CoV-2/FLU/RSV testing.  Fact Sheet for Patients: bloggercourse.com  Fact Sheet for Healthcare Providers: seriousbroker.it  This test is not yet approved or cleared by the [place]  FDA and has been authorized for detection and/or diagnosis of SARS-CoV-2 by FDA under an Emergency Use Authorization (EUA). This EUA will remain in effect (meaning this test can be used) for the duration of the COVID-19 declaration under Section 564(b)(1) of the Act, 21 U.S.C. section 360bbb-3(b)(1), unless the authorization is  terminated or revoked.     Resp Syncytial Virus by PCR NEGATIVE NEGATIVE Final    Comment: (NOTE) Fact Sheet for Patients: bloggercourse.com  Fact Sheet for Healthcare Providers: seriousbroker.it  This test is not yet approved or cleared by the [place]  FDA and has been authorized for detection and/or diagnosis of SARS-CoV-2 by FDA under an Emergency Use Authorization (EUA). This EUA will remain in effect (meaning this test can be used) for the duration of the COVID-19 declaration under Section 564(b)(1) of the Act, 21 U.S.C. section 360bbb-3(b)(1), unless the authorization is terminated or  revoked.  Performed at [facility], [address] [place], [place] [address]   Blood culture (routine x 2)     Status: Abnormal   Collection Time: 09/25/24  2:20 AM   Specimen: BLOOD  Result Value Ref Range Status   Specimen Description BLOOD SITE NOT SPECIFIED  Final   Special Requests   Final    BOTTLES DRAWN AEROBIC AND ANAEROBIC Blood Culture adequate volume   Culture  Setup Time   Final    GRAM POSITIVE COCCI IN BOTH AEROBIC AND ANAEROBIC BOTTLES CRITICAL RESULT CALLED TO, READ BACK BY AND VERIFIED WITH: PHARMD C. AMEND [phone] @ [phone] FH Performed at [facility], [address] [place], [place] [address]    Culture ENTEROCOCCUS FAECALIS (A)  Final   Report Status 09/27/2024 FINAL  Final   Organism ID, Bacteria ENTEROCOCCUS FAECALIS  Final      Susceptibility   Enterococcus faecalis - MIC*    AMPICILLIN  <=2 SENSITIVE Sensitive     VANCOMYCIN  1 SENSITIVE Sensitive     GENTAMICIN  SYNERGY SENSITIVE Sensitive     * ENTEROCOCCUS FAECALIS  Blood Culture ID Panel (Reflexed)     Status: Abnormal   Collection Time: 09/25/24  2:20 AM  Result Value Ref Range Status   Enterococcus faecalis DETECTED (A) NOT DETECTED Final    Comment: CRITICAL RESULT CALLED TO, READ BACK BY AND VERIFIED WITH: PHARMD C. AMEND [phone] @ 1638  FH    Enterococcus Faecium NOT DETECTED NOT DETECTED Final   Listeria monocytogenes NOT DETECTED NOT DETECTED Final   Staphylococcus species NOT DETECTED NOT DETECTED Final   Staphylococcus aureus (BCID) NOT DETECTED NOT DETECTED Final   Staphylococcus epidermidis NOT DETECTED NOT DETECTED Final   Staphylococcus lugdunensis NOT DETECTED NOT DETECTED Final   Streptococcus species NOT DETECTED NOT DETECTED Final   Streptococcus agalactiae NOT DETECTED NOT DETECTED Final   Streptococcus pneumoniae NOT DETECTED NOT DETECTED Final   Streptococcus pyogenes NOT DETECTED NOT DETECTED Final   A.calcoaceticus-baumannii NOT DETECTED NOT DETECTED Final   Bacteroides fragilis NOT DETECTED NOT DETECTED Final   Enterobacterales NOT DETECTED NOT DETECTED Final   Enterobacter cloacae complex NOT DETECTED NOT DETECTED Final   Escherichia coli NOT DETECTED NOT DETECTED Final   Klebsiella aerogenes NOT DETECTED NOT DETECTED Final   Klebsiella oxytoca NOT DETECTED NOT DETECTED Final   Klebsiella pneumoniae NOT DETECTED NOT DETECTED Final   Proteus species NOT DETECTED NOT DETECTED Final   Salmonella species NOT DETECTED NOT DETECTED Final   Serratia marcescens NOT DETECTED NOT DETECTED Final   Haemophilus influenzae NOT DETECTED NOT DETECTED Final   Neisseria meningitidis NOT DETECTED NOT DETECTED Final   Pseudomonas aeruginosa NOT DETECTED NOT DETECTED Final   Stenotrophomonas maltophilia NOT DETECTED NOT DETECTED Final   Candida albicans NOT DETECTED NOT DETECTED Final   Candida auris NOT DETECTED NOT DETECTED Final   Candida glabrata NOT DETECTED NOT DETECTED Final   Candida krusei NOT DETECTED NOT DETECTED Final   Candida parapsilosis NOT DETECTED NOT DETECTED Final   Candida tropicalis NOT DETECTED NOT DETECTED Final   Cryptococcus neoformans/gattii NOT DETECTED NOT DETECTED Final   Vancomycin  resistance NOT DETECTED NOT DETECTED Final    Comment: Performed at [facility], [address] [place], [place] [address]  Blood culture (routine x 2)     Status: None (Preliminary result)   Collection Time: 09/25/24  7:02 PM   Specimen: BLOOD LEFT HAND  Result Value Ref Range  Status   Specimen Description BLOOD LEFT HAND  Final   Special Requests   Final    BOTTLES DRAWN AEROBIC AND ANAEROBIC Blood Culture adequate volume   Culture   Final    NO GROWTH 4 DAYS Performed at [facility], [address], [place], [place] [address]    Report Status PENDING  Incomplete  Culture, blood (Routine X 2) w Reflex to ID Panel     Status: None (Preliminary result)   Collection Time: 09/26/24 10:30 AM   Specimen: BLOOD RIGHT ARM  Result Value Ref Range Status   Specimen Description BLOOD RIGHT ARM  Final   Special Requests   Final    BOTTLES DRAWN AEROBIC AND ANAEROBIC Blood Culture adequate volume   Culture   Final    NO GROWTH 3 DAYS Performed at [facility], [address] [place], [place] [address]    Report Status PENDING  Incomplete  Culture, blood (Routine X 2) w Reflex to ID Panel     Status: None (Preliminary result)   Collection Time: 09/26/24 10:41 AM   Specimen: BLOOD  Result Value Ref Range Status   Specimen Description BLOOD BLOOD RIGHT HAND  Final   Special Requests   Final    BOTTLES DRAWN AEROBIC AND ANAEROBIC Blood Culture adequate volume   Culture   Final    NO GROWTH 3 DAYS Performed at [facility], [address], [place], [place] [address]    Report Status PENDING  Incomplete     Corean Fireman, MSN, NP-C [facility]  [email or web address] Pager: [phone] Office: [phone] [facility] Main Line: [phone] *Secure Chat Communication Welcome  Total Encounter Time: 20 m     [1] No Known Allergies  "

## 2024-09-29 NOTE — Care Management Important Message (Signed)
 Important Message  Patient Details  Name: [name] [name] MRN: [id number] Date of Birth: [birth year]   Important Message Given:  Yes - Medicare IM     Jennie Laneta Dragon 09/29/2024, 3:19 PM

## 2024-09-29 NOTE — Transfer of Care (Signed)
 Immediate Anesthesia Transfer of Care Note  Patient: [name] [name]  Procedure(s) Performed: TRANSESOPHAGEAL ECHOCARDIOGRAM  Patient Location: PACU and Cath Lab  Anesthesia Type:MAC  Level of Consciousness: drowsy  Airway & Oxygen Therapy: Patient Spontanous Breathing and Patient connected to nasal cannula oxygen  Post-op Assessment: Report given to RN and Post -op Vital signs reviewed and stable  Post vital signs: stable  Last Vitals:  Vitals Value Taken Time  BP    Temp    Pulse    Resp    SpO2      Last Pain:  Vitals:   09/29/24 1021  TempSrc:   PainSc: 0-No pain         Complications: No notable events documented.

## 2024-09-29 NOTE — Progress Notes (Addendum)
 " PROGRESS NOTE  [name] [name] FMW:[id number] DOB: [birth year] DOA: 09/24/2024 PCP: [name], MD   LOS: 4 days   Brief narrative:  The patient is a 79 yr old male with BPH, hypertension, hyperlipidemia, diabetes mellitus type 2, CAD, paroxysmal atrial fibrillation, valvular heart disease status post AV block status post dual-chamber pacemaker in November 2011 and generator change in 2022, diabetes mellitus presented to the hospital for fever of 2 weeks with generalized weakness and brain fog.  In the ED patient was hemodynamically stable.  Initial WBC was 13 with sodium of 132.  Lipase elevated at 148 but lactate was normal.  Patient was then started on Zosyn  and vancomycin .  ID was consulted and 2D echocardiogram, MRI of the T and L spine was performed.  MRI did not show evidence of lumbar spine infection but diffuse disc and facet degeneration without high-grade spinal stenosis, there is also moderate to severe right and moderate left neural foraminal stenosis at L5-S1. There is also mild to moderal lateral recess stenosis at L3-L4.  Blood cultures showed Enterococcus faecalis. Infectious disease has been consulted.  Plan for TEE 09/29/2024.  Concerned that endocarditis with possible pacer lead infection.  Patient will need at least 6 weeks of IV antibiotics.  Currently, receiving high dose ampicillin , ceftriaxone , and tigan .     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 Fleeta Trigt 09/2010   Status post placement of cardiac pacemaker   Type 2 diabetes mellitus without complication, without long-term current use of insulin  (HCC)   Essential hypertension   Paroxysmal atrial fibrillation (HCC)   History of complete AV block   Aortic stenosis s/p aortic valve placement   History of CAD (coronary artery disease)   Non-insulin  dependent type 2 diabetes mellitus (HCC)    Hypokalemia   Fever of unknown origin -  Enterococcus faecalis Bacteremia CBC showed leukocytosis.  Unable to identify source of infection yet.  MRI of the lumbar spine and MRI of the thoracic spine has ruled out discitis/osteomyelitis.  2D echocardiogram showed bacteremia.. No vegetations were seen on TTE.  TEE was performed on 09/29/2024 with thickening suggestive of endocarditis.  Infectious disease has been consulted with serious concern for infection of the pacer leads. The patient will require miminum of 6 weeks of IV antibiotics. The patient has been switched to high dose ampicillin , ceftriaxone .  WBC at 16.3 from 13.4.    Chronic back pain with sciatica  MRI lumbar and thoracic spine have ruled out infectious process in the spine. 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   Hypokalemia Potassium of 3.0.  Will replenish orally.  Check BMP in AM.   History of aortic stenosis s/p bioprosthesis aortic valve placement No vegetations seen on TTE.  Status post TEE  09/29/2024   History of paroxysmal atrial fibrillation History of AV block status post pacemaker EKG showed PVCs and A-fib.  Continue Eliquis  and metoprolol .   There is concern for infection of pacer leads. If leads cannot be removed the patient may require life long suppressive therapy.  Status post TEE with infectious endocarditis.   Chronic diastolic heart failure History of essential hypertension On metoprolol  50mg  twice daily.  Amlodipine  and spironolactone on hold.   History of CAD No acute disease.  Continue Eliquis , statin and Lopressor .   Insulin -dependent DM type II Continue Jardiance , sliding scale insulin .  Closely monitor  DVT prophylaxis: SCDs Start: 09/25/24 0227 Place TED hose Start: 09/25/24 0227 apixaban  (ELIQUIS ) tablet 5 mg   Disposition: Skilled nursing facility as per PT evaluation  Status is: Inpatient Remains inpatient appropriate because: Pending clinical improvement, need for  rehabitation, on IV antibiotics    Code Status:     Code Status: Full Code  Family Communication: Spoke with the patient's brothers at bedside  Consultants: Cardiology Infectious disease  Procedures: TEE on 09/29/2024  Anti-infectives:  Ampicillin  and Rocephin  IV  Anti-infectives (From admission, onward)    Start     Dose/Rate Route Frequency Ordered Stop   09/26/24 0400  ampicillin  (OMNIPEN) 2 g in sodium chloride  0.9 % 100 mL IVPB        2 g 300 mL/hr over 20 Minutes Intravenous Every 4 hours 09/25/24 2359     09/26/24 0400  cefTRIAXone  (ROCEPHIN ) 2 g in sodium chloride  0.9 % 100 mL IVPB        2 g 200 mL/hr over 30 Minutes Intravenous Every 12 hours 09/25/24 2359     09/25/24 1600  vancomycin  (VANCOCIN ) IVPB 1000 mg/200 mL premix  Status:  Discontinued        1,000 mg 200 mL/hr over 60 Minutes Intravenous Every 12 hours 09/25/24 0311 09/26/24 0000   09/25/24 0800  piperacillin -tazobactam (ZOSYN ) IVPB 3.375 g  Status:  Discontinued        3.375 g 12.5 mL/hr over 240 Minutes Intravenous Every 8 hours 09/25/24 0311 09/26/24 0000   09/25/24 0215  piperacillin -tazobactam (ZOSYN ) IVPB 3.375 g        3.375 g 100 mL/hr over 30 Minutes Intravenous  Once 09/25/24 0204 09/25/24 0337   09/25/24 0215  vancomycin  (VANCOREADY) IVPB 2000 mg/400 mL        2,000 mg 200 mL/hr over 120 Minutes Intravenous  Once 09/25/24 0204 09/25/24 0700       Subjective: Today, patient was seen and examined at bedside.  Patient seen after TEE.  Family at bedside.  States that he did have some acid reflux yesterday and finally had some bowel movements.  Denies any shortness of breath dyspnea chest pain.  Objective: Vitals:   09/29/24 1115 09/29/24 1143  BP: 101/68 122/86  Pulse: 95 (!) 104  Resp: 14   Temp:  98.2 F (36.8 C)  SpO2: 96% 95%    Intake/Output Summary (Last 24 hours) at 09/29/2024 1233 Last data filed at 09/29/2024 1024 Gross per 24 hour  Intake 300 ml  Output 650 ml  Net -350  ml   Filed Weights   09/29/24 0813  Weight: 88 kg   Body mass index is 27.06 kg/m.   Physical Exam:  GENERAL: Patient is alert awake and oriented. Not in obvious distress.  Elderly male, HENT: No scleral pallor or icterus. Pupils equally reactive to light. Oral mucosa is moist NECK: is supple, no gross swelling noted. CHEST: Clear to auscultation. No crackles or wheezes.  Chest wall pacer in place. CVS: S1 and S2 heard, no murmur. Regular rate and rhythm.  ABDOMEN: Soft, non-tender, bowel sounds are present. EXTREMITIES: No edema.  External urinary catheter CNS: Cranial nerves are intact. No focal motor deficits. SKIN: warm and dry without rashes.  Data Review: I have personally reviewed the following laboratory data and studies,  CBC: Recent Labs  Lab 09/24/24 1959 09/24/24 2003 09/25/24 0220 09/26/24 0456 09/27/24 0428 09/28/24 0439 09/29/24 0516  WBC 13.0*  --  10.0 12.0* 13.6* 13.4* 16.3*  NEUTROABS 10.6*  --   --  9.6* 10.7* 10.4* 13.7*  HGB 14.0   < > 13.1 12.5* 12.6* 13.7 13.9  HCT 40.8   < > 39.5 35.4* 36.6* 40.5 40.1  MCV 90.3  --  94.0 88.5 89.7 91.2 89.7  PLT 210  --  180 178 203 194 223   < > = values in this interval not displayed.   Basic Metabolic Panel: Recent Labs  Lab 09/25/24 0220 09/25/24 0832 09/26/24 0456 09/27/24 0428 09/28/24 0439 09/29/24 0516  NA 133* 131* 133* 132* 135 133*  K 3.4* 3.7 3.1* 4.4 3.9 3.0*  CL 97* 96* 98 98 99 97*  CO2 22 21* 22 22 21* 20*  GLUCOSE 97 89 91 83 86 107*  BUN 14 12 13 13 13 12   CREATININE 0.61 0.65 0.63 0.67 0.55* 0.50*  CALCIUM  8.0* 8.1* 7.9* 8.0* 8.1* 7.9*  MG 2.1  --   --   --   --   --    Liver Function Tests: Recent Labs  Lab 09/24/24 1959  AST 41  ALT 41  ALKPHOS 89  BILITOT 1.2  PROT 6.2*  ALBUMIN 3.4*   Recent Labs  Lab 09/24/24 1959  LIPASE 148*   No results for input(s): AMMONIA in the last 168 hours. Cardiac Enzymes: No results for input(s): CKTOTAL, CKMB, CKMBINDEX,  TROPONINI in the last 168 hours. BNP (last 3 results) No results for input(s): BNP in the last 8760 hours.  ProBNP (last 3 results) No results for input(s): PROBNP in the last 8760 hours.  CBG: Recent Labs  Lab 09/28/24 1143 09/28/24 1659 09/28/24 2102 09/29/24 0655 09/29/24 1143  GLUCAP 106* 149* 133* 161* 130*   Recent Results (from the past 240 hours)  Resp panel by RT-PCR (RSV, Flu A&B, Covid) Anterior Nasal Swab     Status: None   Collection Time: 09/22/24 12:55 PM   Specimen: Anterior Nasal Swab  Result Value Ref Range Status   SARS Coronavirus 2 by RT PCR NEGATIVE NEGATIVE Final    Comment: (NOTE) SARS-CoV-2 target nucleic acids are NOT DETECTED.  The SARS-CoV-2 RNA is generally detectable in upper respiratory specimens during the acute phase of infection. The lowest concentration of SARS-CoV-2 viral copies this assay can detect is 138 copies/mL. A negative result does not preclude SARS-Cov-2 infection and should not be used as the sole basis for treatment or other patient management decisions. A negative result may occur with  improper specimen collection/handling, submission of specimen other than nasopharyngeal swab, presence of viral mutation(s) within the areas targeted by this assay, and inadequate number of viral copies(<138 copies/mL). A negative result must be combined with clinical observations, patient history, and epidemiological information. The expected result is Negative.  Fact Sheet for Patients:  bloggercourse.com  Fact Sheet for Healthcare Providers:  seriousbroker.it  This test is no t yet approved or cleared by the [place]  FDA and  has been authorized for detection and/or diagnosis of SARS-CoV-2 by FDA under an Emergency Use Authorization (EUA). This EUA will remain  in effect (meaning this test can be used) for the duration of the COVID-19 declaration under Section 564(b)(1) of  the Act, 21 U.S.C.section 360bbb-3(b)(1), unless the authorization is terminated  or revoked sooner.       Influenza A by PCR NEGATIVE NEGATIVE Final   Influenza B by PCR NEGATIVE NEGATIVE Final    Comment: (NOTE) The Xpert Xpress SARS-CoV-2/FLU/RSV plus assay is intended as an aid in the diagnosis of influenza from Nasopharyngeal swab specimens and should not  be used as a sole basis for treatment. Nasal washings and aspirates are unacceptable for Xpert Xpress SARS-CoV-2/FLU/RSV testing.  Fact Sheet for Patients: bloggercourse.com  Fact Sheet for Healthcare Providers: seriousbroker.it  This test is not yet approved or cleared by the [place]  FDA and has been authorized for detection and/or diagnosis of SARS-CoV-2 by FDA under an Emergency Use Authorization (EUA). This EUA will remain in effect (meaning this test can be used) for the duration of the COVID-19 declaration under Section 564(b)(1) of the Act, 21 U.S.C. section 360bbb-3(b)(1), unless the authorization is terminated or revoked.     Resp Syncytial Virus by PCR NEGATIVE NEGATIVE Final    Comment: (NOTE) Fact Sheet for Patients: bloggercourse.com  Fact Sheet for Healthcare Providers: seriousbroker.it  This test is not yet approved or cleared by the [place]  FDA and has been authorized for detection and/or diagnosis of SARS-CoV-2 by FDA under an Emergency Use Authorization (EUA). This EUA will remain in effect (meaning this test can be used) for the duration of the COVID-19 declaration under Section 564(b)(1) of the Act, 21 U.S.C. section 360bbb-3(b)(1), unless the authorization is terminated or revoked.  Performed at [facility], [address] [place], [place] [address]   Resp panel by RT-PCR (RSV, Flu A&B, Covid) Anterior Nasal Swab     Status: None   Collection Time: 09/24/24 11:10 PM    Specimen: Anterior Nasal Swab  Result Value Ref Range Status   SARS Coronavirus 2 by RT PCR NEGATIVE NEGATIVE Final   Influenza A by PCR NEGATIVE NEGATIVE Final   Influenza B by PCR NEGATIVE NEGATIVE Final    Comment: (NOTE) The Xpert Xpress SARS-CoV-2/FLU/RSV plus assay is intended as an aid in the diagnosis of influenza from Nasopharyngeal swab specimens and should not be used as a sole basis for treatment. Nasal washings and aspirates are unacceptable for Xpert Xpress SARS-CoV-2/FLU/RSV testing.  Fact Sheet for Patients: bloggercourse.com  Fact Sheet for Healthcare Providers: seriousbroker.it  This test is not yet approved or cleared by the [place]  FDA and has been authorized for detection and/or diagnosis of SARS-CoV-2 by FDA under an Emergency Use Authorization (EUA). This EUA will remain in effect (meaning this test can be used) for the duration of the COVID-19 declaration under Section 564(b)(1) of the Act, 21 U.S.C. section 360bbb-3(b)(1), unless the authorization is terminated or revoked.     Resp Syncytial Virus by PCR NEGATIVE NEGATIVE Final    Comment: (NOTE) Fact Sheet for Patients: bloggercourse.com  Fact Sheet for Healthcare Providers: seriousbroker.it  This test is not yet approved or cleared by the [place]  FDA and has been authorized for detection and/or diagnosis of SARS-CoV-2 by FDA under an Emergency Use Authorization (EUA). This EUA will remain in effect (meaning this test can be used) for the duration of the COVID-19 declaration under Section 564(b)(1) of the Act, 21 U.S.C. section 360bbb-3(b)(1), unless the authorization is terminated or revoked.  Performed at [facility], [address] [place], [place] [address]   Blood culture (routine x 2)     Status: Abnormal   Collection Time: 09/25/24  2:20 AM   Specimen: BLOOD  Result  Value Ref Range Status   Specimen Description BLOOD SITE NOT SPECIFIED  Final   Special Requests   Final    BOTTLES DRAWN AEROBIC AND ANAEROBIC Blood Culture adequate volume   Culture  Setup Time   Final    GRAM POSITIVE COCCI IN BOTH AEROBIC AND ANAEROBIC BOTTLES CRITICAL  RESULT CALLED TO, READ BACK BY AND VERIFIED WITH: PHARMD C. AMEND [phone] @ [phone] FH Performed at [facility], [address] [place], [place] [address]    Culture ENTEROCOCCUS FAECALIS (A)  Final   Report Status 09/27/2024 FINAL  Final   Organism ID, Bacteria ENTEROCOCCUS FAECALIS  Final      Susceptibility   Enterococcus faecalis - MIC*    AMPICILLIN  <=2 SENSITIVE Sensitive     VANCOMYCIN  1 SENSITIVE Sensitive     GENTAMICIN  SYNERGY SENSITIVE Sensitive     * ENTEROCOCCUS FAECALIS  Blood Culture ID Panel (Reflexed)     Status: Abnormal   Collection Time: 09/25/24  2:20 AM  Result Value Ref Range Status   Enterococcus faecalis DETECTED (A) NOT DETECTED Final    Comment: CRITICAL RESULT CALLED TO, READ BACK BY AND VERIFIED WITH: PHARMD C. AMEND [phone] @ 1638 FH    Enterococcus Faecium NOT DETECTED NOT DETECTED Final   Listeria monocytogenes NOT DETECTED NOT DETECTED Final   Staphylococcus species NOT DETECTED NOT DETECTED Final   Staphylococcus aureus (BCID) NOT DETECTED NOT DETECTED Final   Staphylococcus epidermidis NOT DETECTED NOT DETECTED Final   Staphylococcus lugdunensis NOT DETECTED NOT DETECTED Final   Streptococcus species NOT DETECTED NOT DETECTED Final   Streptococcus agalactiae NOT DETECTED NOT DETECTED Final   Streptococcus pneumoniae NOT DETECTED NOT DETECTED Final   Streptococcus pyogenes NOT DETECTED NOT DETECTED Final   A.calcoaceticus-baumannii NOT DETECTED NOT DETECTED Final   Bacteroides fragilis NOT DETECTED NOT DETECTED Final   Enterobacterales NOT DETECTED NOT DETECTED Final   Enterobacter cloacae complex NOT DETECTED NOT DETECTED Final   Escherichia coli NOT DETECTED NOT  DETECTED Final   Klebsiella aerogenes NOT DETECTED NOT DETECTED Final   Klebsiella oxytoca NOT DETECTED NOT DETECTED Final   Klebsiella pneumoniae NOT DETECTED NOT DETECTED Final   Proteus species NOT DETECTED NOT DETECTED Final   Salmonella species NOT DETECTED NOT DETECTED Final   Serratia marcescens NOT DETECTED NOT DETECTED Final   Haemophilus influenzae NOT DETECTED NOT DETECTED Final   Neisseria meningitidis NOT DETECTED NOT DETECTED Final   Pseudomonas aeruginosa NOT DETECTED NOT DETECTED Final   Stenotrophomonas maltophilia NOT DETECTED NOT DETECTED Final   Candida albicans NOT DETECTED NOT DETECTED Final   Candida auris NOT DETECTED NOT DETECTED Final   Candida glabrata NOT DETECTED NOT DETECTED Final   Candida krusei NOT DETECTED NOT DETECTED Final   Candida parapsilosis NOT DETECTED NOT DETECTED Final   Candida tropicalis NOT DETECTED NOT DETECTED Final   Cryptococcus neoformans/gattii NOT DETECTED NOT DETECTED Final   Vancomycin  resistance NOT DETECTED NOT DETECTED Final    Comment: Performed at [facility], [address], [place], [place] [address]  Blood culture (routine x 2)     Status: None (Preliminary result)   Collection Time: 09/25/24  7:02 PM   Specimen: BLOOD LEFT HAND  Result Value Ref Range Status   Specimen Description BLOOD LEFT HAND  Final   Special Requests   Final    BOTTLES DRAWN AEROBIC AND ANAEROBIC Blood Culture adequate volume   Culture   Final    NO GROWTH 4 DAYS Performed at [facility], [address], [place], [place] [address]    Report Status PENDING  Incomplete  Culture, blood (Routine X 2) w Reflex to ID Panel     Status: None (Preliminary result)   Collection Time: 09/26/24 10:30 AM   Specimen: BLOOD RIGHT ARM  Result Value  Ref Range Status   Specimen Description BLOOD RIGHT ARM  Final   Special Requests   Final    BOTTLES DRAWN AEROBIC AND ANAEROBIC Blood Culture adequate volume   Culture   Final    NO GROWTH 3  DAYS Performed at [facility], [address] [place], [place] [address]    Report Status PENDING  Incomplete  Culture, blood (Routine X 2) w Reflex to ID Panel     Status: None (Preliminary result)   Collection Time: 09/26/24 10:41 AM   Specimen: BLOOD  Result Value Ref Range Status   Specimen Description BLOOD BLOOD RIGHT HAND  Final   Special Requests   Final    BOTTLES DRAWN AEROBIC AND ANAEROBIC Blood Culture adequate volume   Culture   Final    NO GROWTH 3 DAYS Performed at [facility], [address] [place], [place] [address]    Report Status PENDING  Incomplete     Studies: EP STUDY Result Date: 09/29/2024 See surgical note for result.  CT HEAD WO CONTRAST ( ) Result Date: 09/28/2024 EXAM: CT HEAD WITHOUT CONTRAST 09/28/2024 11:17:03 AM TECHNIQUE: CT of the head was performed without the administration of intravenous contrast. Automated exposure control, iterative reconstruction, and/or weight based adjustment of the mA/kV was utilized to reduce the radiation dose to as low as reasonably achievable. COMPARISON: 09/22/2024 CLINICAL HISTORY: stroke stroke stroke stroke stroke FINDINGS: BRAIN AND VENTRICLES: No acute hemorrhage. No evidence of acute infarct. No hydrocephalus. No extra-axial collection. No mass effect or midline shift. Scattered supratentorial white matter hypoattenuation foci, likely chronic microvascular ischemic changes are stable. Mild generalized atrophy is also stable. Atherosclerotic calcifications are present in the cavernous carotid arteries bilaterally and at the dural margin of both vertebral arteries. Carotid siphon vascular calcifications. No hyperdense vessel is present. ORBITS: No acute abnormality. SINUSES: No acute abnormality. SOFT TISSUES AND SKULL: No acute soft tissue abnormality. No skull fracture. IMPRESSION: 1. No acute intracranial abnormality. Electronically signed by: Lonni Necessary MD 09/28/2024 11:30 AM EST RP  Workstation: [id number]      Vernal Alstrom, MD  [facility] 09/29/2024  If 7PM-7AM, please contact night-coverage             "

## 2024-09-29 NOTE — Consult Note (Cosign Needed Addendum)
 "  Cardiology Consultation   Patient ID: [name] [name] MRN: [id number]; DOB: Jun 21, 1946  Admit date: 09/24/2024 Date of Consult: 09/29/2024  PCP:  [name], MD   [facility] Providers Cardiologist:  Jerel Balding, MD    Patient Profile: [name] [name] is a 79 y.o. male with a hx of  HTN, HLD, DM Advanced AV block w/PPM AVR (severe AS, bioprosthetic 2012) AFib   who is being seen 09/29/2024 for the evaluation of PPM/bacteremia at the request of Dr. Sonjia.  Device information MDT dual chamber PPM He has 2 5086 leads, both implanted on 07/21/2010 Generator is an Azure from 05/23/21  History of Present Illness: Mr. [name] [name] was admitted 09/26/23 with a couple week of generalized malaise, weakness, brain fog, did seem to get better then developed fever at home and progressive back pain came in (Had arecent ER visit with what was described as orthostatic hypotension and AKI, responed to IVF).  ID consulted 1/16 with BC that came Enterococcus faecalis started treating for presumptive endocarditis given his AVR and PPM  BC from 1/16 > neg to date  TEE done today  KEY FINDINGS: No evidence of endocarditis. Bioprosthetic valve leaflet thickening   Normal biventricular systolic function  Full report to follow. Further management per primary team.   There is an amended report since I initially  wrote this note: KEY FINDINGS: Mobile thickening on bioprosthetic valve c/f endocarditis.  Normal biventricular systolic function  Full report to follow. Further management per primary team.   EP engaged given his PPM/bacteremia   LABS Notable for  K+ 3.0 this AM > replacement ordered already via attending team WBC has been  13 >10 > 12 > 13.6 > 13.4 > 16.3 H/H 13/40 Plts 223  He is on Eliquis  for OAC/Afib, (none so far today) Remote dated 08/17/24 AP 54.3 % VP 2.1% 0.6% AFib burden   The patient until a couple weeks ago fully independent, started to  progressively get weaker, weaker, his brother (s) bedside report that day of admission and a few days before so weak, and so much back pain > that he could not get up without assistance. The pt denies CP, SOB His brothers earlier today started to notice that his speech has slowed, perhaps slurred some > attending team is aware and MRI ordered, pending   Past Medical History:  Diagnosis Date   Anticoagulant long-term use    eliquis --- managed by cardiology   Aortic valve insufficiency    cardiologist--- dr balding;   01/ 2012  s/p AVR for severe AS , bicusipd;  last echo 12-25-2019  mild AR no stenosis, mean grandiant   BPH associated with nocturia    Coronary artery disease    cardiologist--- dr croitoru;   nuclear stress test 02-28-2007  normal perfusion no ischemia, nuclear ef 58%;   cardiac cath 06-30-2010  severe AS and nonobstructive disease involving LAD/ CFx/ RCA   Heart murmur    History of non-ST elevation myocardial infarction (NSTEMI) 06/2010   History of ventricular tachycardia 06/2010   nonsustained VT in setting NSTEMI; severe AS   Hypertension    Malignant neoplasm prostate (HCC) 07/2021   urologist--- dr gay:  dx 11/ 2022 Gleason 4+3, PSA 11.9   Mitral insufficiency    per echo 12-25-2019  mile regurg,  no stenosis, degenerative   Mixed dyslipidemia    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 patel   (01-13-2022  pt stated only  checks sugar occasionally   Wears glasses     Past Surgical History:  Procedure Laterality Date   AORTIC VALVE REPLACEMENT  10/06/2010   @[facility]   by dr emeterio Celestia Staple Ease prosthesis   CARDIAC CATHETERIZATION  06/30/2010   @[facility]   ;   nonobstructive CAD, heavily ca+ AOV   GOLD SEED IMPLANT N/A 01/17/2022   Procedure: GOLD SEED IMPLANT;  Surgeon: Selma Donnice SAUNDERS, MD;  Location: [facility];  Service: Urology;  Laterality: N/A;  30 MINS   LUMBAR DISC SURGERY     yrs ago   PERMANENT PACEMAKER INSERTION  07/21/2010   @[facility]  by dr croitoru;  Medtronic   PPM GENERATOR CHANGEOUT N/A 05/23/2021   Procedure: PPM GENERATOR CHANGEOUT;  Surgeon: Francyne Headland, MD;  Location: [facility] INVASIVE [facility];  Service: Cardiovascular;  Laterality: N/A;   SPACE OAR INSTILLATION N/A 01/17/2022   Procedure: SPACE OAR INSTILLATION;  Surgeon: Selma Donnice SAUNDERS, MD;  Location: [facility];  Service: Urology;  Laterality: N/A;      Scheduled Meds:  apixaban   5 mg Oral BID   atorvastatin   40 mg Oral Daily   empagliflozin   10 mg Oral Daily   feeding supplement  237 mL Oral BID BM   insulin  aspart  0-6 Units Subcutaneous TID WC   metoprolol  tartrate  50 mg Oral BID   potassium chloride   40 mEq Oral Once   sodium chloride  flush  3 mL Intravenous Q12H   sodium chloride  flush  3 mL Intravenous Q12H   Continuous Infusions:  ampicillin  (OMNIPEN) IV 2 g (09/29/24 0319)   cefTRIAXone  (ROCEPHIN )  IV 2 g (09/28/24 2152)   PRN Meds: acetaminophen , alum & mag hydroxide-simeth, oxyCODONE , sodium chloride  flush, trimethobenzamide   Allergies:   Allergies[1]  Social History:   Social History   Socioeconomic History   Marital status: Single    Spouse name: Not on file   Number of children: Not on file   Years of education: Not on file   Highest education level: Not on file  Occupational History   Not on file  Tobacco Use   Smoking status: Former    Current packs/day: 0.00    Types:  Cigarettes    Start date: 79    Quit date: 1995    Years since quitting: 31.0   Smokeless tobacco: Former    Types: Chew    Quit date: 1995  Vaping Use   Vaping status: Never Used  Substance and Sexual Activity   Alcohol use: No   Drug use: Never   Sexual activity: Not on file  Other Topics Concern   Not on file  Social History Narrative   Not on file   Social Drivers of Health   Tobacco Use: Medium Risk (09/25/2024)   Patient History    Smoking Tobacco Use: Former    Smokeless Tobacco Use: Former  Passive Exposure: Not on file  Financial Resource Strain: Not on file  Food Insecurity: No Food Insecurity (09/25/2024)   Epic    Worried About Programme Researcher, Broadcasting/film/video in the Last Year: Never true    Ran Out of Food in the Last Year: Never true  Transportation Needs: No Transportation Needs (09/25/2024)   Epic    Lack of Transportation (Medical): No    Lack of Transportation (Non-Medical): No  Physical Activity: Not on file  Stress: Not on file  Social Connections: Socially Isolated (09/25/2024)   Social Connection and Isolation Panel    Frequency of Communication with Friends and Family: More than three times a week    Frequency of Social Gatherings with Friends and Family: More than three times a week    Attends Religious Services: Never    Database Administrator or Organizations: No    Attends Banker Meetings: Never    Marital Status: Divorced  Catering Manager Violence: Not At Risk (09/25/2024)   Epic    Fear of Current or Ex-Partner: No    Emotionally Abused: No    Physically Abused: No    Sexually Abused: No  Depression (PHQ2-9): Not on file  Alcohol Screen: Not on file  Housing: Low Risk (09/25/2024)   Epic    Unable to Pay for Housing in the Last Year: No    Number of Times Moved in the Last Year: 0    Homeless in the Last Year: No  Utilities: Not At Risk (09/25/2024)   Epic    Threatened with loss of utilities: No  Health Literacy: Not on file     Family History:  History reviewed. No pertinent family history.   ROS:  Please see the history of present illness.  All other ROS reviewed and negative.     Physical Exam/Data: Vitals:   09/29/24 0912 09/29/24 1100 09/29/24 1115 09/29/24 1143  BP: (!) 153/90 106/71 101/68 122/86  Pulse: (!) 103 98 95 (!) 104  Resp: 20 15 14    Temp: 97.7 F (36.5 C)   98.2 F (36.8 C)  TempSrc: Temporal   Oral  SpO2: 92% 96% 96% 95%  Weight:      Height:        Intake/Output Summary (Last 24 hours) at 09/29/2024 1216 Last data filed at 09/29/2024 1024 Gross per 24 hour  Intake 300 ml  Output 650 ml  Net -350 ml      09/29/2024    8:13 AM 09/22/2024   10:34 AM 05/15/2024    8:26 AM  Last 3 Weights  Weight (lbs) 194 lb 0.1 oz 194 lb 206 lb 11.2 oz  Weight (kg) 88 kg 87.998 kg 93.759 kg     Body mass index is 27.06 kg/m.  General:  Well nourished, well developed, in no acute distress, he is AAO x4 HEENT: normal Neck: no JVD Vascular: No carotid bruits; Distal pulses 2+ bilaterally Cardiac:  RRR; 1-2/6 SM Lungs:  CTA b/l, no wheezing, rhonchi or rales  Abd: soft, nontender  Ext: no edema Musculoskeletal:  No deformities Skin: warm and dry  Neuro:  no gross focal abnormalities noted Psych:  Normal affect   EKG:  The EKG was personally reviewed and demonstrates:   SR, PACs, PVC, a A paced beat, RBBB  Telemetry:  Telemetry was personally reviewed and demonstrates:   SR mostly, some A and V pacing  Relevant CV Studies:  09/25/24: TTE 1. Left ventricular ejection fraction, by estimation,  is 60 to 65%. The  left ventricle has normal function. The left ventricle has no regional  wall motion abnormalities. Left ventricular diastolic parameters are  indeterminate. Elevated left atrial  pressure.   2. Right ventricular systolic function is normal. The right ventricular  size is normal.   3. The mitral valve is normal in structure. Trivial mitral valve  regurgitation. No evidence of  mitral stenosis. Severe mitral annular  calcification.   4. The aortic valve has been repaired/replaced. Aortic valve  regurgitation is mild. No aortic stenosis is present. There is a 21 mm  Magna Ease bioprosthetic valve present in the aortic position. Procedure  Date: 10/06/10.   5. The inferior vena cava is dilated in size with >50% respiratory  variability, suggesting right atrial pressure of 8 mmHg.    Laboratory Data: High Sensitivity Troponin:  No results for input(s): TROPONINIHS in the last 720 hours. No results for input(s): TRNPT in the last 720 hours.    Chemistry Recent Labs  Lab 09/25/24 0220 09/25/24 9167 09/27/24 0428 09/28/24 0439 09/29/24 0516  NA 133*   < > 132* 135 133*  K 3.4*   < > 4.4 3.9 3.0*  CL 97*   < > 98 99 97*  CO2 22   < > 22 21* 20*  GLUCOSE 97   < > 83 86 107*  BUN 14   < > 13 13 12   CREATININE 0.61   < > 0.67 0.55* 0.50*  CALCIUM  8.0*   < > 8.0* 8.1* 7.9*  MG 2.1  --   --   --   --   GFRNONAA >60   < > >60 >60 >60  ANIONGAP 15   < > 13 15 16*   < > = values in this interval not displayed.    Recent Labs  Lab 09/24/24 1959  PROT 6.2*  ALBUMIN 3.4*  AST 41  ALT 41  ALKPHOS 89  BILITOT 1.2   Lipids No results for input(s): CHOL, TRIG, HDL, LABVLDL, LDLCALC, CHOLHDL in the last 168 hours.  Hematology Recent Labs  Lab 09/27/24 0428 09/28/24 0439 09/29/24 0516  WBC 13.6* 13.4* 16.3*  RBC 4.08* 4.44 4.47  HGB 12.6* 13.7 13.9  HCT 36.6* 40.5 40.1  MCV 89.7 91.2 89.7  MCH 30.9 30.9 31.1  MCHC 34.4 33.8 34.7  RDW 13.8 14.0 13.8  PLT 203 194 223   Thyroid No results for input(s): TSH, FREET4 in the last 168 hours.  BNPNo results for input(s): BNP, PROBNP in the last 168 hours.  DDimer No results for input(s): DDIMER in the last 168 hours.  Radiology/Studies:   09/29/24: TEE  1. No left atrial/left atrial appendage thrombus was detected. The LAA  emptying velocity was 92 cm/s.   2. Right atrial size was  Lead in RA.   3. Large pleural effusion in the left lateral region.   4. The mitral valve is degenerative. Mild mitral valve regurgitation.  Moderate mitral stenosis. The mean mitral valve gradient is 8.0 mmHg.  Moderate mitral annular calcification.   5. 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 The aortic valve  has been repaired/replaced. Aortic valve  regurgitation is not visualized. There is a Magna bioprosthetic valve  present in the aortic position. Procedure Date: 09/2010.   6. Aortic  dilatation noted. There is dilatation of the ascending aorta,  measuring 43 mm.   7. 3D performed of the aortic valve.   Comparison(s): Compared to prior TTE, there is evidence of prosthetic  valve stenosis with PV increasing from 2 to 3.4 m/sec, and MG increasing  from 10 to 26.   Conclusion(s)/Recommendation(s): Findings are concerning for  vegetation/infective endocarditis as detailed above.    CT HEAD WO CONTRAST ( ) Result Date: 09/28/2024 EXAM: CT HEAD WITHOUT CONTRAST 09/28/2024 11:17:03 AM TECHNIQUE: CT of the head was performed without the administration of intravenous contrast. Automated exposure control, iterative reconstruction, and/or weight based adjustment of the mA/kV was utilized to reduce the radiation dose to as low as reasonably achievable. COMPARISON: 09/22/2024 CLINICAL HISTORY: stroke stroke stroke stroke stroke FINDINGS: BRAIN AND VENTRICLES: No acute hemorrhage. No evidence of acute infarct. No hydrocephalus. No extra-axial collection. No mass effect or midline shift. Scattered supratentorial white matter hypoattenuation foci, likely chronic microvascular ischemic changes are stable. Mild generalized atrophy is also stable. Atherosclerotic calcifications are present in the cavernous carotid arteries bilaterally and at the dural  margin of both vertebral arteries. Carotid siphon vascular calcifications. No hyperdense vessel is present. ORBITS: No acute abnormality. SINUSES: No acute abnormality. SOFT TISSUES AND SKULL: No acute soft tissue abnormality. No skull fracture. IMPRESSION: 1. No acute intracranial abnormality. Electronically signed by: Lonni Necessary MD 09/28/2024 11:30 AM EST RP Workstation: [id number]   MR THORACIC SPINE W WO CONTRAST Result Date: 09/25/2024 EXAM: MRI THORACIC SPINE WITH AND WITHOUT INTRAVENOUS CONTRAST 09/25/2024 01:10:16 PM TECHNIQUE: Multiplanar multisequence MRI of the thoracic spine was performed with and without the administration of intravenous contrast. COMPARISON: None available. CLINICAL HISTORY: Fever, back pain, and lower extremity weakness. FINDINGS: BONES AND ALIGNMENT: Trace anterolisthesis of C7 on T1 and T1 on T2. Hemangioma in the T11 vertebral body. No fracture, suspicious marrow lesion, significant marrow edema, or evidence of discitis. Mild scattered multilevel chronic degenerative endplate changes and small Schmorl nodes. SPINAL CORD: Normal spinal cord signal and morphology. No abnormal intradural enhancement. No epidural collection. SOFT TISSUES: Small pleural effusions, right larger than left. No paraspinal collection. DEGENERATIVE CHANGES: Mild diffuse thoracic disc degeneration and predominantly mild facet arthrosis without a sizable disc herniation, spinal stenosis, or compressive neural foraminal stenosis. Incompletely evaluated lower cervical disc degeneration. IMPRESSION: 1. No evidence of thoracic spine infection. 2. Mild diffuse thoracic disc degeneration without significant stenosis. 3. Small pleural effusions. Electronically signed by: Dasie Hamburg MD 09/25/2024 02:39 PM EST RP Workstation: [id number]    Assessment and Plan:  PPM Not dependent by his december remote A paces ~ 50% Rarely V paces Device information MDT dual chamber PPM He has 2 5086 leads, both  implanted on 07/21/2010 Generator is an Azure from 05/23/21   Pending CTS evaluation for device options    Bacteremia enterococcus faecalis  ID on board TEE report NOW says Mobile thickening on bioprosthetic valve c/f endocarditis.  Images reviewed by Dr. Almetta He will need CTS service consult   Paroxysmal AFib CHA2DS2 is 4, on Eliquis  > did not get this morning prior to TEE I have d/c OAC for now at least until plan is in place regarding his  He had no LA or LAA thrombus on TEE )d/w Dr. Almetta, keep off [facility] given TEE without thrombus, and in SR until procedural decisions made   4. Some slurring of speech noted by family MRI ordered (in progress) Concerning for septic emboli in given TEE   For questions or updates,  please contact [facility] Please consult [email or web address] for contact info under  Signed, Charlies Macario Arthur, PA-C  09/29/2024 12:16 PM     [1] No Known Allergies  "

## 2024-09-29 NOTE — Progress Notes (Incomplete)
" °  Device system confirmed to be MRI conditional, with implant date > 6 weeks ago, and no evidence of abandoned or epicardial leads in review of most recent CXR  Device last cleared by EP Provider: Lesia, on 09/02/24  Clearance is good through for 1 year as long as parameters remain stable at time of check. If pt undergoes a cardiac device procedure during that time, they should be re-cleared.   Tachy-therapies to be programmed off if applicable with device back to pre-MRI settings after completion of exam.  Medtronic - Programming recommendation received through Medtronic App/Tablet  Tobias LITTIE Leander, RT  09/29/2024 4:19 PM     "

## 2024-09-30 ENCOUNTER — Inpatient Hospital Stay (HOSPITAL_COMMUNITY)

## 2024-09-30 DIAGNOSIS — T827XXD Infection and inflammatory reaction due to other cardiac and vascular devices, implants and grafts, subsequent encounter: Secondary | ICD-10-CM

## 2024-09-30 DIAGNOSIS — B952 Enterococcus as the cause of diseases classified elsewhere: Secondary | ICD-10-CM

## 2024-09-30 DIAGNOSIS — E119 Type 2 diabetes mellitus without complications: Secondary | ICD-10-CM

## 2024-09-30 DIAGNOSIS — R7881 Bacteremia: Secondary | ICD-10-CM

## 2024-09-30 DIAGNOSIS — A498 Other bacterial infections of unspecified site: Secondary | ICD-10-CM | POA: Diagnosis not present

## 2024-09-30 DIAGNOSIS — I76 Septic arterial embolism: Secondary | ICD-10-CM

## 2024-09-30 DIAGNOSIS — I330 Acute and subacute infective endocarditis: Secondary | ICD-10-CM

## 2024-09-30 DIAGNOSIS — R29701 NIHSS score 1: Secondary | ICD-10-CM

## 2024-09-30 DIAGNOSIS — I63441 Cerebral infarction due to embolism of right cerebellar artery: Secondary | ICD-10-CM

## 2024-09-30 DIAGNOSIS — Z8679 Personal history of other diseases of the circulatory system: Secondary | ICD-10-CM | POA: Diagnosis not present

## 2024-09-30 DIAGNOSIS — I480 Paroxysmal atrial fibrillation: Secondary | ICD-10-CM | POA: Diagnosis not present

## 2024-09-30 DIAGNOSIS — T826XXD Infection and inflammatory reaction due to cardiac valve prosthesis, subsequent encounter: Secondary | ICD-10-CM | POA: Diagnosis not present

## 2024-09-30 DIAGNOSIS — I6340 Cerebral infarction due to embolism of unspecified cerebral artery: Secondary | ICD-10-CM | POA: Diagnosis not present

## 2024-09-30 DIAGNOSIS — T826XXA Infection and inflammatory reaction due to cardiac valve prosthesis, initial encounter: Secondary | ICD-10-CM

## 2024-09-30 DIAGNOSIS — I69391 Dysphagia following cerebral infarction: Secondary | ICD-10-CM | POA: Diagnosis not present

## 2024-09-30 DIAGNOSIS — Z952 Presence of prosthetic heart valve: Secondary | ICD-10-CM | POA: Diagnosis not present

## 2024-09-30 DIAGNOSIS — I38 Endocarditis, valve unspecified: Secondary | ICD-10-CM

## 2024-09-30 DIAGNOSIS — E785 Hyperlipidemia, unspecified: Secondary | ICD-10-CM

## 2024-09-30 DIAGNOSIS — T827XXA Infection and inflammatory reaction due to other cardiac and vascular devices, implants and grafts, initial encounter: Secondary | ICD-10-CM

## 2024-09-30 DIAGNOSIS — I350 Nonrheumatic aortic (valve) stenosis: Secondary | ICD-10-CM | POA: Diagnosis not present

## 2024-09-30 DIAGNOSIS — I2510 Atherosclerotic heart disease of native coronary artery without angina pectoris: Secondary | ICD-10-CM

## 2024-09-30 DIAGNOSIS — I69398 Other sequelae of cerebral infarction: Secondary | ICD-10-CM

## 2024-09-30 DIAGNOSIS — I10 Essential (primary) hypertension: Secondary | ICD-10-CM

## 2024-09-30 DIAGNOSIS — Z8546 Personal history of malignant neoplasm of prostate: Secondary | ICD-10-CM

## 2024-09-30 DIAGNOSIS — Z950 Presence of cardiac pacemaker: Secondary | ICD-10-CM

## 2024-09-30 LAB — CBC
HCT: 39.1 % (ref 39.0–52.0)
Hemoglobin: 13.7 g/dL (ref 13.0–17.0)
MCH: 31.4 pg (ref 26.0–34.0)
MCHC: 35 g/dL (ref 30.0–36.0)
MCV: 89.7 fL (ref 80.0–100.0)
Platelets: 252 K/uL (ref 150–400)
RBC: 4.36 MIL/uL (ref 4.22–5.81)
RDW: 14.2 % (ref 11.5–15.5)
WBC: 18 K/uL — ABNORMAL HIGH (ref 4.0–10.5)
nRBC: 0 % (ref 0.0–0.2)

## 2024-09-30 LAB — CULTURE, BLOOD (ROUTINE X 2)
Culture: NO GROWTH
Special Requests: ADEQUATE

## 2024-09-30 LAB — GLUCOSE, CAPILLARY
Glucose-Capillary: 152 mg/dL — ABNORMAL HIGH (ref 70–99)
Glucose-Capillary: 122 mg/dL — ABNORMAL HIGH (ref 70–99)
Glucose-Capillary: 118 mg/dL — ABNORMAL HIGH (ref 70–99)
Glucose-Capillary: 102 mg/dL — ABNORMAL HIGH (ref 70–99)

## 2024-09-30 LAB — BASIC METABOLIC PANEL WITH GFR
Anion gap: 17 — ABNORMAL HIGH (ref 5–15)
BUN: 19 mg/dL (ref 8–23)
CO2: 20 mmol/L — ABNORMAL LOW (ref 22–32)
Calcium: 7.8 mg/dL — ABNORMAL LOW (ref 8.9–10.3)
Chloride: 99 mmol/L (ref 98–111)
Creatinine, Ser: 0.5 mg/dL — ABNORMAL LOW (ref 0.61–1.24)
GFR, Estimated: >60 mL/min
Glucose, Bld: 144 mg/dL — ABNORMAL HIGH (ref 70–99)
Potassium: 3.1 mmol/L — ABNORMAL LOW (ref 3.5–5.1)
Sodium: 135 mmol/L (ref 135–145)

## 2024-09-30 LAB — PHOSPHORUS: Phosphorus: 3.4 mg/dL (ref 2.5–4.6)

## 2024-09-30 MED ORDER — SACCHAROMYCES BOULARDII 250 MG PO CAPS
250.0000 mg | ORAL_CAPSULE | Freq: Two times a day (BID) | ORAL | Status: DC
  Administered 2024-09-30: 250 mg via ORAL
  Filled 2024-09-30 (×2): qty 1

## 2024-09-30 MED ORDER — IOHEXOL 350 MG/ML SOLN
75.0000 mL | Freq: Once | INTRAVENOUS | Status: AC | PRN
  Administered 2024-09-30: 75 mL via INTRAVENOUS

## 2024-09-30 MED ORDER — POTASSIUM CHLORIDE CRYS ER 20 MEQ PO TBCR
40.0000 meq | EXTENDED_RELEASE_TABLET | Freq: Once | ORAL | Status: AC
  Administered 2024-09-30: 40 meq via ORAL
  Filled 2024-09-30: qty 2

## 2024-09-30 MED ORDER — STROKE: EARLY STAGES OF RECOVERY BOOK
Freq: Once | Status: AC
  Filled 2024-09-30: qty 1

## 2024-09-30 NOTE — Progress Notes (Signed)
 CSW met with pt regarding SDOH: social connections.  Pt lives alone, does have 2 brothers and other family members nearby who remain in regular contact.  Discussed local resources for social opportunities in the community and these were placed on pt AVS. Cathlyn Ferry, MSW, LCSW 1/20/202611:23 AM

## 2024-09-30 NOTE — Progress Notes (Addendum)
 " PROGRESS NOTE  [name] [name] FMW:[id number] DOB: 06-04-1946 DOA: 09/24/2024 PCP: [name] Louann DASEN, MD   LOS: 5 days   Brief narrative:  The patient is a 79 yr old male with BPH, hypertension, hyperlipidemia, diabetes mellitus type 2, CAD, paroxysmal atrial fibrillation, valvular heart disease status post AV block status post dual-chamber pacemaker in November 2011 and generator change in 2022, diabetes mellitus presented to the hospital for fever of 2 weeks with generalized weakness and brain fog.  In the ED patient was hemodynamically stable.  Initial WBC was 13 with sodium of 132.  Lipase elevated at 148 but lactate was normal.  Patient was then started on Zosyn  and vancomycin .  ID was consulted and 2D echocardiogram, MRI of the T and L spine was performed.  MRI did not show evidence of lumbar spine infection but diffuse disc and facet degeneration without high-grade spinal stenosis, there is also moderate to severe right and moderate left neural foraminal stenosis at L5-S1. There is also mild to moderal lateral recess stenosis at L3-L4.  Blood cultures showed Enterococcus faecalis. Infectious disease has been consulted.  Underwent TEE 09/29/2024 and was noted to have thickening of the valves concerning for endocarditis and electrophysiology cardiology and CT surgery has been consulted at this time.    Patient will need at least 6 weeks of IV antibiotics.  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 Fleeta Trigt 09/2010   Status post placement of cardiac pacemaker   Type 2 diabetes mellitus without complication, without long-term current use of insulin  (HCC)   Essential hypertension   Paroxysmal atrial fibrillation (HCC)   History of complete AV block   Aortic stenosis s/p aortic valve placement   History of CAD  (coronary artery disease)   Non-insulin  dependent type 2 diabetes mellitus (HCC)   Hypokalemia   Prosthetic valve endocarditis   Pacemaker infection   Fever of unknown origin -  Enterococcus faecalis Bacteremia with septic emboli to the brain CBC showed leukocytosis.  Unable to identify source of infection yet.  MRI of the lumbar spine and MRI of the thoracic spine has ruled out discitis/osteomyelitis.  2D echocardiogram showed bacteremia.. No vegetations were seen on TTE.  TEE was performed on 09/29/2024 with thickening suggestive of prosthetic aortic valve endocarditis.  Patient also has pacemaker in place.  Infectious disease on board and at this time cardiothoracic surgery and electrophysiology cardiology will be consulted for further opinion..  Patient is currently on ampicillin  and ceftriaxone .  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  Had brain fog initially with a distant history of slurred speech.  Incidentally noted.  No focal deficits at this time.  Will communicated with neurology for further opinion.  Patient was on Eliquis  until 09/28/2024 and was on hold yesterday.    Chronic back pain with sciatica  MRI lumbar and thoracic spine have ruled out infectious process in the spine. 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  Continue supportive care.   Hypokalemia Potassium again today at 3.1.  Will continue to replenish.  Check BMP in AM.   History of aortic stenosis s/p bioprosthesis aortic valve placement No vegetations seen on TTE.  Status post TEE  09/29/2024 there is concern for infective endocarditis.  Electrophysiology cardiology  surgery has been consulted.   History of paroxysmal atrial fibrillation History of AV block status post pacemaker EKG showed PVCs and A-fib.  Was on Eliquis  and metoprolol .  Currently on hold.  Chronic diastolic heart failure History of essential hypertension On metoprolol   50mg  twice daily.  Amlodipine  and spironolactone on hold.   History of CAD No acute disease.  Continue Eliquis , statin and Lopressor .   Insulin -dependent DM type II Continue Jardiance , sliding scale insulin .  Closely monitor.  Last POC glucose of 152.   DVT prophylaxis: Place and maintain sequential compression device Start: 09/29/24 1244 SCDs Start: 09/25/24 0227 Place TED hose Start: 09/25/24 0227   Disposition: Skilled nursing facility as per PT evaluation  Status is: Inpatient Remains inpatient appropriate because: Pending clinical improvement, need for rehabitation, on IV antibiotics, electrophysiology and CT surgery consultation    Code Status:     Code Status: Full Code  Family Communication: Spoke with the patient's brothers at bedside on 09/29/2024, I spoke with the patient niece who is also the help out of attorney on 09/30/2024.  Consultants: Cardiology Infectious disease Electrophysiology cardiology CT surgery Neurology opinion  Procedures: TEE on 09/29/2024  Anti-infectives:  Ampicillin  and Rocephin  IV  Anti-infectives (From admission, onward)    Start     Dose/Rate Route Frequency Ordered Stop   09/26/24 0400  ampicillin  (OMNIPEN) 2 g in sodium chloride  0.9 % 100 mL IVPB        2 g 300 mL/hr over 20 Minutes Intravenous Every 4 hours 09/25/24 2359     09/26/24 0400  cefTRIAXone  (ROCEPHIN ) 2 g in sodium chloride  0.9 % 100 mL IVPB        2 g 200 mL/hr over 30 Minutes Intravenous Every 12 hours 09/25/24 2359     09/25/24 1600  vancomycin  (VANCOCIN ) IVPB 1000 mg/200 mL premix  Status:  Discontinued        1,000 mg 200 mL/hr over 60 Minutes Intravenous Every 12 hours 09/25/24 0311 09/26/24 0000   09/25/24 0800  piperacillin -tazobactam (ZOSYN ) IVPB 3.375 g  Status:  Discontinued        3.375 g 12.5 mL/hr over 240 Minutes Intravenous Every 8 hours 09/25/24 0311 09/26/24 0000   09/25/24 0215  piperacillin -tazobactam (ZOSYN ) IVPB 3.375 g        3.375 g 100  mL/hr over 30 Minutes Intravenous  Once 09/25/24 0204 09/25/24 0337   09/25/24 0215  vancomycin  (VANCOREADY) IVPB 2000 mg/400 mL        2,000 mg 200 mL/hr over 120 Minutes Intravenous  Once 09/25/24 0204 09/25/24 0700       Subjective: Today, patient was seen and examined at bedside.  Patient states that he has been having some loose stools for 2-3 times a day.  No abdominal pain.  Denies any chest pain shortness of breath dyspnea dizziness or lightheadedness.  Objective: Vitals:   09/30/24 0343 09/30/24 0736  BP: (!) 159/92 (!) 146/82  Pulse: 86 91  Resp: 15 16  Temp: (!) 97.5 F (36.4 C) 98.5 F (36.9 C)  SpO2: 94% 94%    Intake/Output Summary (Last 24 hours) at 09/30/2024 1003 Last data filed at 09/30/2024 0130 Gross per 24 hour  Intake --  Output 600 ml  Net -600 ml   Filed Weights   09/29/24 0813  Weight: 88 kg   Body mass index is 27.06 kg/m.   Physical Exam:  GENERAL: Patient is alert awake and oriented. Not in obvious distress.  Elderly male, HENT: No scleral  pallor or icterus. Pupils equally reactive to light. Oral mucosa is moist NECK: is supple, no gross swelling noted. CHEST: Clear to auscultation. No crackles or wheezes.  Chest wall pacer in place. CVS: S1 and S2 heard, no murmur. Regular rate and rhythm.  ABDOMEN: Soft, non-tender, bowel sounds are present.  External urinary catheter in place. EXTREMITIES: No edema.  External urinary catheter CNS: Cranial nerves are intact. No focal motor deficits. SKIN: warm and dry without rashes.  Data Review: I have personally reviewed the following laboratory data and studies,  CBC: Recent Labs  Lab 09/24/24 1959 09/24/24 2003 09/26/24 0456 09/27/24 0428 09/28/24 0439 09/29/24 0516 09/30/24 0442  WBC 13.0*   < > 12.0* 13.6* 13.4* 16.3* 18.0*  NEUTROABS 10.6*  --  9.6* 10.7* 10.4* 13.7*  --   HGB 14.0   < > 12.5* 12.6* 13.7 13.9 13.7  HCT 40.8   < > 35.4* 36.6* 40.5 40.1 39.1  MCV 90.3   < > 88.5 89.7  91.2 89.7 89.7  PLT 210   < > 178 203 194 223 252   < > = values in this interval not displayed.   Basic Metabolic Panel: Recent Labs  Lab 09/25/24 0220 09/25/24 9167 09/26/24 0456 09/27/24 0428 09/28/24 0439 09/29/24 0516 09/30/24 0442  NA 133*   < > 133* 132* 135 133* 135  K 3.4*   < > 3.1* 4.4 3.9 3.0* 3.1*  CL 97*   < > 98 98 99 97* 99  CO2 22   < > 22 22 21* 20* 20*  GLUCOSE 97   < > 91 83 86 107* 144*  BUN 14   < > 13 13 13 12 19   CREATININE 0.61   < > 0.63 0.67 0.55* 0.50* 0.50*  CALCIUM  8.0*   < > 7.9* 8.0* 8.1* 7.9* 7.8*  MG 2.1  --   --   --   --   --   --   PHOS  --   --   --   --   --   --  3.4   < > = values in this interval not displayed.   Liver Function Tests: Recent Labs  Lab 09/24/24 1959  AST 41  ALT 41  ALKPHOS 89  BILITOT 1.2  PROT 6.2*  ALBUMIN 3.4*   Recent Labs  Lab 09/24/24 1959  LIPASE 148*   No results for input(s): AMMONIA in the last 168 hours. Cardiac Enzymes: No results for input(s): CKTOTAL, CKMB, CKMBINDEX, TROPONINI in the last 168 hours. BNP (last 3 results) No results for input(s): BNP in the last 8760 hours.  ProBNP (last 3 results) No results for input(s): PROBNP in the last 8760 hours.  CBG: Recent Labs  Lab 09/29/24 0655 09/29/24 1143 09/29/24 1714 09/29/24 2134 09/30/24 0627  GLUCAP 161* 130* 116* 129* 152*   Recent Results (from the past 240 hours)  Resp panel by RT-PCR (RSV, Flu A&B, Covid) Anterior Nasal Swab     Status: None   Collection Time: 09/22/24 12:55 PM   Specimen: Anterior Nasal Swab  Result Value Ref Range Status   SARS Coronavirus 2 by RT PCR NEGATIVE NEGATIVE Final    Comment: (NOTE) SARS-CoV-2 target nucleic acids are NOT DETECTED.  The SARS-CoV-2 RNA is generally detectable in upper respiratory specimens during the acute phase of infection. The lowest concentration of SARS-CoV-2 viral copies this assay can detect is 138 copies/mL. A negative result does not preclude  SARS-Cov-2 infection and should  not be used as the sole basis for treatment or other patient management decisions. A negative result may occur with  improper specimen collection/handling, submission of specimen other than nasopharyngeal swab, presence of viral mutation(s) within the areas targeted by this assay, and inadequate number of viral copies(<138 copies/mL). A negative result must be combined with clinical observations, patient history, and epidemiological information. The expected result is Negative.  Fact Sheet for Patients:  bloggercourse.com  Fact Sheet for Healthcare Providers:  seriousbroker.it  This test is no t yet approved or cleared by the [place]  FDA and  has been authorized for detection and/or diagnosis of SARS-CoV-2 by FDA under an Emergency Use Authorization (EUA). This EUA will remain  in effect (meaning this test can be used) for the duration of the COVID-19 declaration under Section 564(b)(1) of the Act, 21 U.S.C.section 360bbb-3(b)(1), unless the authorization is terminated  or revoked sooner.       Influenza A by PCR NEGATIVE NEGATIVE Final   Influenza B by PCR NEGATIVE NEGATIVE Final    Comment: (NOTE) The Xpert Xpress SARS-CoV-2/FLU/RSV plus assay is intended as an aid in the diagnosis of influenza from Nasopharyngeal swab specimens and should not be used as a sole basis for treatment. Nasal washings and aspirates are unacceptable for Xpert Xpress SARS-CoV-2/FLU/RSV testing.  Fact Sheet for Patients: bloggercourse.com  Fact Sheet for Healthcare Providers: seriousbroker.it  This test is not yet approved or cleared by the [place]  FDA and has been authorized for detection and/or diagnosis of SARS-CoV-2 by FDA under an Emergency Use Authorization (EUA). This EUA will remain in effect (meaning this test can be used) for the duration of  the COVID-19 declaration under Section 564(b)(1) of the Act, 21 U.S.C. section 360bbb-3(b)(1), unless the authorization is terminated or revoked.     Resp Syncytial Virus by PCR NEGATIVE NEGATIVE Final    Comment: (NOTE) Fact Sheet for Patients: bloggercourse.com  Fact Sheet for Healthcare Providers: seriousbroker.it  This test is not yet approved or cleared by the [place]  FDA and has been authorized for detection and/or diagnosis of SARS-CoV-2 by FDA under an Emergency Use Authorization (EUA). This EUA will remain in effect (meaning this test can be used) for the duration of the COVID-19 declaration under Section 564(b)(1) of the Act, 21 U.S.C. section 360bbb-3(b)(1), unless the authorization is terminated or revoked.  Performed at [facility], [address] [place], [place] [address]   Resp panel by RT-PCR (RSV, Flu A&B, Covid) Anterior Nasal Swab     Status: None   Collection Time: 09/24/24 11:10 PM   Specimen: Anterior Nasal Swab  Result Value Ref Range Status   SARS Coronavirus 2 by RT PCR NEGATIVE NEGATIVE Final   Influenza A by PCR NEGATIVE NEGATIVE Final   Influenza B by PCR NEGATIVE NEGATIVE Final    Comment: (NOTE) The Xpert Xpress SARS-CoV-2/FLU/RSV plus assay is intended as an aid in the diagnosis of influenza from Nasopharyngeal swab specimens and should not be used as a sole basis for treatment. Nasal washings and aspirates are unacceptable for Xpert Xpress SARS-CoV-2/FLU/RSV testing.  Fact Sheet for Patients: bloggercourse.com  Fact Sheet for Healthcare Providers: seriousbroker.it  This test is not yet approved or cleared by the [place]  FDA and has been authorized for detection and/or diagnosis of SARS-CoV-2 by FDA under an Emergency Use Authorization (EUA). This EUA will remain in effect (meaning this test can be used) for the  duration of the COVID-19 declaration under Section 564(b)(1) of the  Act, 21 U.S.C. section 360bbb-3(b)(1), unless the authorization is terminated or revoked.     Resp Syncytial Virus by PCR NEGATIVE NEGATIVE Final    Comment: (NOTE) Fact Sheet for Patients: bloggercourse.com  Fact Sheet for Healthcare Providers: seriousbroker.it  This test is not yet approved or cleared by the [place]  FDA and has been authorized for detection and/or diagnosis of SARS-CoV-2 by FDA under an Emergency Use Authorization (EUA). This EUA will remain in effect (meaning this test can be used) for the duration of the COVID-19 declaration under Section 564(b)(1) of the Act, 21 U.S.C. section 360bbb-3(b)(1), unless the authorization is terminated or revoked.  Performed at [facility], [address], [place], [place] [address]   Blood culture (routine x 2)     Status: Abnormal   Collection Time: 09/25/24  2:20 AM   Specimen: BLOOD  Result Value Ref Range Status   Specimen Description BLOOD SITE NOT SPECIFIED  Final   Special Requests   Final    BOTTLES DRAWN AEROBIC AND ANAEROBIC Blood Culture adequate volume   Culture  Setup Time   Final    GRAM POSITIVE COCCI IN BOTH AEROBIC AND ANAEROBIC BOTTLES CRITICAL RESULT CALLED TO, READ BACK BY AND VERIFIED WITH: PHARMD C. AMEND [phone] @ [phone] Performed at [facility], [address], [place], [place] [address]    Culture ENTEROCOCCUS FAECALIS (A)  Final   Report Status 09/27/2024 FINAL  Final   Organism ID, Bacteria ENTEROCOCCUS FAECALIS  Final      Susceptibility   Enterococcus faecalis - MIC*    AMPICILLIN  <=2 SENSITIVE Sensitive     VANCOMYCIN  1 SENSITIVE Sensitive     GENTAMICIN  SYNERGY SENSITIVE Sensitive     * ENTEROCOCCUS FAECALIS  Blood Culture ID Panel (Reflexed)     Status: Abnormal   Collection Time: 09/25/24  2:20 AM  Result Value Ref Range Status   Enterococcus  faecalis DETECTED (A) NOT DETECTED Final    Comment: CRITICAL RESULT CALLED TO, READ BACK BY AND VERIFIED WITH: PHARMD C. AMEND [phone] @ 1638 FH    Enterococcus Faecium NOT DETECTED NOT DETECTED Final   Listeria monocytogenes NOT DETECTED NOT DETECTED Final   Staphylococcus species NOT DETECTED NOT DETECTED Final   Staphylococcus aureus (BCID) NOT DETECTED NOT DETECTED Final   Staphylococcus epidermidis NOT DETECTED NOT DETECTED Final   Staphylococcus lugdunensis NOT DETECTED NOT DETECTED Final   Streptococcus species NOT DETECTED NOT DETECTED Final   Streptococcus agalactiae NOT DETECTED NOT DETECTED Final   Streptococcus pneumoniae NOT DETECTED NOT DETECTED Final   Streptococcus pyogenes NOT DETECTED NOT DETECTED Final   A.calcoaceticus-baumannii NOT DETECTED NOT DETECTED Final   Bacteroides fragilis NOT DETECTED NOT DETECTED Final   Enterobacterales NOT DETECTED NOT DETECTED Final   Enterobacter cloacae complex NOT DETECTED NOT DETECTED Final   Escherichia coli NOT DETECTED NOT DETECTED Final   Klebsiella aerogenes NOT DETECTED NOT DETECTED Final   Klebsiella oxytoca NOT DETECTED NOT DETECTED Final   Klebsiella pneumoniae NOT DETECTED NOT DETECTED Final   Proteus species NOT DETECTED NOT DETECTED Final   Salmonella species NOT DETECTED NOT DETECTED Final   Serratia marcescens NOT DETECTED NOT DETECTED Final   Haemophilus influenzae NOT DETECTED NOT DETECTED Final   Neisseria meningitidis NOT DETECTED NOT DETECTED Final   Pseudomonas aeruginosa NOT DETECTED NOT DETECTED Final   Stenotrophomonas maltophilia NOT DETECTED NOT DETECTED Final   Candida albicans NOT DETECTED NOT DETECTED Final   Candida auris NOT DETECTED  NOT DETECTED Final   Candida glabrata NOT DETECTED NOT DETECTED Final   Candida krusei NOT DETECTED NOT DETECTED Final   Candida parapsilosis NOT DETECTED NOT DETECTED Final   Candida tropicalis NOT DETECTED NOT DETECTED Final   Cryptococcus neoformans/gattii NOT  DETECTED NOT DETECTED Final   Vancomycin  resistance NOT DETECTED NOT DETECTED Final    Comment: Performed at [facility], [address] [place], [place] [address]  Blood culture (routine x 2)     Status: None   Collection Time: 09/25/24  7:02 PM   Specimen: BLOOD LEFT HAND  Result Value Ref Range Status   Specimen Description BLOOD LEFT HAND  Final   Special Requests   Final    BOTTLES DRAWN AEROBIC AND ANAEROBIC Blood Culture adequate volume   Culture   Final    NO GROWTH 5 DAYS Performed at [facility], [address] [place], [place] [address]    Report Status 09/30/2024 FINAL  Final  Culture, blood (Routine X 2) w Reflex to ID Panel     Status: None (Preliminary result)   Collection Time: 09/26/24 10:30 AM   Specimen: BLOOD RIGHT ARM  Result Value Ref Range Status   Specimen Description BLOOD RIGHT ARM  Final   Special Requests   Final    BOTTLES DRAWN AEROBIC AND ANAEROBIC Blood Culture adequate volume   Culture   Final    NO GROWTH 4 DAYS Performed at [facility], [address] [place], [place] [address]    Report Status PENDING  Incomplete  Culture, blood (Routine X 2) w Reflex to ID Panel     Status: None (Preliminary result)   Collection Time: 09/26/24 10:41 AM   Specimen: BLOOD  Result Value Ref Range Status   Specimen Description BLOOD BLOOD RIGHT HAND  Final   Special Requests   Final    BOTTLES DRAWN AEROBIC AND ANAEROBIC Blood Culture adequate volume   Culture   Final    NO GROWTH 4 DAYS Performed at [facility], [address], [place], [place] [address]    Report Status PENDING  Incomplete     Studies: MR BRAIN W WO CONTRAST Result Date: 09/29/2024 EXAM: MRI BRAIN WITH AND WITHOUT CONTRAST 09/29/2024 04:19:34 PM TECHNIQUE: Multiplanar multisequence MRI of the head/brain was performed with and without the administration of intravenous contrast. CONTRAST: 8 mL of GADAVIST  COMPARISON: CT HEAD WITHOUT CONTRAST 09/28/2024 CLINICAL  HISTORY: Rule out stroke. 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 Single acute infarct in the right parietal lobe. Patchy T2 hyperintensity in the white matter is compatible with chronic microvascular ischemic change. Remote lacunar infarct in the right corona radiata. No acute intracranial hemorrhage. No mass effect or midline shift. No hydrocephalus. The sella is unremarkable. Normal flow voids. No abnormal enhancement. ORBITS: No significant abnormality. SINUSES: No significant abnormality. BONES AND SOFT TISSUES: Normal bone marrow signal. No soft tissue abnormality. IMPRESSION: 1. 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 2.  Single acute infarct in the right parietal lobe. 3. Remote lacunar infarct in the right corona radiata and mild for age chronic microvascular ischemic change. Electronically signed by: Glendia Molt MD 09/29/2024 06:59 PM EST RP Workstation: [id number]   ECHO TEE Result Date: 09/29/2024    TRANSESOPHOGEAL ECHO REPORT   Patient Name:   [name] [name] Date of Exam: 09/29/2024 Medical Rec #:  [id number]      Height:  71.0 in Accession #:    [id number]     Weight:       194.0 lb Date of Birth:  [birth year]       BSA:          2.081 m Patient Age:    78 years       BP:           185/78 mmHg Patient Gender: M              HR:           106 bpm. Exam Location:  Inpatient Procedure: 3D Echo, Transesophageal Echo, Cardiac Doppler and Color Doppler            (Both Spectral and Color Flow Doppler were utilized during            procedure). Indications:     Subacute bacterial endocarditis  History:         Patient has prior history of Echocardiogram examinations, most                  recent 09/25/2024. CAD, Abnormal ECG and Pacemaker, Aortic Valve                  Disease, Arrythmias:Atrial Fibrillation and AV block,                  Signs/Symptoms:Fever; Risk Factors:Hypertension and Diabetes.                  Aortic stenosis.  AVR.                  Aortic Valve: Magna bioprosthetic valve is present in the                  aortic position. Procedure Date: 09/2010.  Sonographer:     Ellouise Mose RDCS Referring Phys:  [id number] ZANE ADAMS Diagnosing Phys: Joelle Cedars Tonleu PROCEDURE: After discussion of the risks and benefits of a TEE, an informed consent was obtained from the patient. The transesophogeal probe was passed without difficulty through the esophogus of the patient. Imaged were obtained with the patient in a left lateral decubitus position. Sedation performed by different physician. The patient was monitored while under deep sedation. Anesthestetic sedation was provided intravenously by Anesthesiology: 200mg  of Propofol , 50mg  of Lidocaine . The patient's vital signs; including heart rate, blood pressure, and oxygen saturation; remained stable throughout the procedure. Supplementary images were obtained from transthoracic windows as indicated to answer the clinical question. The patient developed no complications during the procedure.  IMPRESSIONS  1. No left atrial/left atrial appendage thrombus was detected. The LAA emptying velocity was 92 cm/s.  2. Right atrial size was Lead in RA.  3. Large pleural effusion in the left lateral region.  4. The mitral valve is degenerative. Mild mitral valve regurgitation. Moderate mitral stenosis. The mean mitral valve gradient is 8.0 mmHg. Moderate mitral annular calcification.  5. There is a mobile mass at 10/11 o'clock in the short axis (#31, #32, #36), likely at the tip of the leaflet w/ possible prosthetic valve stenosis with PV 3.44 m/sec, MG 26 mmHg, and DVI 0.30. The aortic valve has been repaired/replaced. Aortic valve regurgitation is not visualized. There is a Magna bioprosthetic valve present in the aortic position. Procedure Date: 09/2010.  6. Aortic dilatation noted. There is dilatation of the ascending aorta, measuring 43 mm.  7. 3D performed of the aortic valve. Comparison(s):  Compared to prior TTE, there is  evidence of prosthetic valve stenosis with PV increasing from 2 to 3.4 m/sec, and MG increasing from 10 to 26. Conclusion(s)/Recommendation(s): Findings are concerning for vegetation/infective endocarditis as detailed above. FINDINGS  Left Ventricle: Left Atrium: No left atrial/left atrial appendage thrombus was detected. The LAA emptying velocity was 92 cm/s. Right Atrium: Right atrial size was Lead in RA. Pericardium: There is no evidence of pericardial effusion. Mitral Valve: The mitral valve is degenerative in appearance. There is moderate thickening of the mitral valve leaflet(s). There is moderate calcification of the mitral valve leaflet(s). Moderately decreased mobility of the mitral valve leaflets. Moderate mitral annular calcification. Mild mitral valve regurgitation. Moderate mitral valve stenosis. MV peak gradient, 13.5 mmHg. The mean mitral valve gradient is 8.0 mmHg with average heart rate of 105 bpm. Aortic Valve: There is a mobile mass at 10/11 o'clock in the short axis (#31, #32, #36), likely at the tip of the leaflet w/ possible prosthetic valve stenosis with PV 3.44 m/sec, MG 26 mmHg, and DVI 0.30. The aortic valve has been repaired/replaced. Aortic valve regurgitation is not visualized. Aortic valve mean gradient measures 26.0 mmHg. Aortic valve peak gradient measures 47.3 mmHg. Aortic valve area, by VTI measures 1.43 cm. There is a Magna bioprosthetic valve present in the aortic position. Procedure Date: 09/2010. Pulmonic Valve: Pulmonic valve regurgitation is mild. Aorta: Aortic dilatation noted. There is dilatation of the ascending aorta, measuring 43 mm. IAS/Shunts: There is redundancy of the interatrial septum. No atrial level shunt detected by color flow Doppler. Additional Comments: A device lead is visualized. There is a large pleural effusion in the left lateral region. Spectral Doppler performed. LEFT VENTRICLE PLAX 2D LVOT diam:     2.30 cm LV SV:          64 LV SV Index:   31 LVOT Area:     4.15 cm  AORTIC VALVE AV Area (Vmax):    1.25 cm AV Area (Vmean):   1.51 cm AV Area (VTI):     1.43 cm AV Vmax:           344.00 cm/s AV Vmean:          185.000 cm/s AV VTI:            0.448 m AV Peak Grad:      47.3 mmHg AV Mean Grad:      26.0 mmHg LVOT Vmax:         103.77 cm/s LVOT Vmean:        67.100 cm/s LVOT VTI:          0.154 m LVOT/AV VTI ratio: 0.34  AORTA Ao Asc diam: 4.30 cm MITRAL VALVE MV Area VTI:  2.41 cm    SHUNTS MV Peak grad: 13.5 mmHg   Systemic VTI:  0.15 m MV Mean grad: 8.0 mmHg    Systemic Diam: 2.30 cm MV Vmax:      1.84 m/s MV Vmean:     135.0 cm/s Joelle Azobou Tonleu Electronically signed by Joelle Cedars Tonleu Signature Date/Time: 09/29/2024/2:00:09 PM    Final (Updated)    EP STUDY Result Date: 09/29/2024 See surgical note for result.  CT HEAD WO CONTRAST ( ) Result Date: 09/28/2024 EXAM: CT HEAD WITHOUT CONTRAST 09/28/2024 11:17:03 AM TECHNIQUE: CT of the head was performed without the administration of intravenous contrast. Automated exposure control, iterative reconstruction, and/or weight based adjustment of the mA/kV was utilized to reduce the radiation dose to as low as reasonably achievable. COMPARISON: 09/22/2024 CLINICAL HISTORY: stroke stroke  stroke stroke stroke FINDINGS: BRAIN AND VENTRICLES: No acute hemorrhage. No evidence of acute infarct. No hydrocephalus. No extra-axial collection. No mass effect or midline shift. Scattered supratentorial white matter hypoattenuation foci, likely chronic microvascular ischemic changes are stable. Mild generalized atrophy is also stable. Atherosclerotic calcifications are present in the cavernous carotid arteries bilaterally and at the dural margin of both vertebral arteries. Carotid siphon vascular calcifications. No hyperdense vessel is present. ORBITS: No acute abnormality. SINUSES: No acute abnormality. SOFT TISSUES AND SKULL: No acute soft tissue abnormality. No skull fracture.  IMPRESSION: 1. No acute intracranial abnormality. Electronically signed by: Lonni Necessary MD 09/28/2024 11:30 AM EST RP Workstation: [id number]      Vernal Alstrom, MD  [facility] 09/30/2024  If 7PM-7AM, please contact night-coverage             "

## 2024-09-30 NOTE — Anesthesia Postprocedure Evaluation (Signed)
"   Anesthesia Post Note  Patient: [name] [name]  Procedure(s) Performed: TRANSESOPHAGEAL ECHOCARDIOGRAM     Patient location during evaluation: Cath Lab Anesthesia Type: MAC Level of consciousness: awake and alert Pain management: pain level controlled Vital Signs Assessment: post-procedure vital signs reviewed and stable Respiratory status: spontaneous breathing, nonlabored ventilation and respiratory function stable Cardiovascular status: stable and blood pressure returned to baseline Postop Assessment: no apparent nausea or vomiting Anesthetic complications: no   No notable events documented.  Last Vitals:    Last Pain:                 Garnette FORBES Skillern      "

## 2024-09-30 NOTE — Progress Notes (Signed)
 Patient was out of room yesterday when Dr. Almetta went by to see him Seen and examined this morning by Dr. 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  Defer to attending/neurology eval for recommendations on anticoagulation in the interim given MRI findings.  Device extraction alone, would not be beneficial His new/abrupt high RV pacing needs suggests perivalvular abscess is likely/suspected Our card-master requested cardiothoracic surgery consult for their evaluation/recommendations  Charlies Arthur, PA-C

## 2024-09-30 NOTE — Progress Notes (Signed)
 Physical Therapy Treatment Patient Details Name: [name] [name] MRN: [id number] DOB: 07/03/1946 Today's Date: 09/30/2024   History of Present Illness 79 y.o. male presents 09/24/24 with c/o fever, generalized weakness, and brain fog since 09/07/24. Seen at [facility] ED found to have hypotension and AKI. Pt with c/o back pain, MRIs performed of the thoracic and lumbar spine without evidence of infection. Blood cultures now positive for Enterococcus faecalis. 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    PT Comments  Pt reports not feeling well today due to poor sleep, but agreeable to limited session. Pt continues to need modA to complete bed mobility, but once sitting EOB is able to balance with BUE support even with LE exercises. However, pt with limited seated tolerance this session due to progressive dizziness with pt eventually requesting return to supine. BP 140/80 upon retrieval of dynamap (pt in supine at time of that reading). In general, full coordination and strength testing limited by limited seated tolerance at this time, pt with decreased ROM in LE exercises in LLE compared to RLE. Given new diagnoses and significant decline from baseline, recommend intensive therapies after d/c >3hours/day to facilitate return to maximal level of independence with mobility and transfers.    If plan is discharge home, recommend the following: A lot of help with walking and/or transfers;A lot of help with bathing/dressing/bathroom;Assist for transportation;Help with stairs or ramp for entrance   Can travel by private vehicle     No   Equipment Recommendations  Rolling walker (2 wheels);BSC/3in1    Recommendations for Other Services       Precautions / Restrictions Precautions Precautions: Fall Recall of Precautions/Restrictions: Intact Precaution/Restrictions Comments: watch BP Restrictions Weight Bearing Restrictions Per Provider Order: No     Mobility  Bed Mobility Overal bed mobility: Needs Assistance Bed Mobility: Supine to Sit, Sit to Supine     Supine to sit: Mod assist Sit to supine: Min assist   General bed mobility comments: modA to raise trunk from EOB, pt able to manage LE. minA to return LE to bed    Transfers                   General transfer comment: deferred due to dizziness in sitting and general malaise    Ambulation/Gait                   Stairs             Wheelchair Mobility     Tilt Bed    Modified Rankin (Stroke Patients Only) Modified Rankin (Stroke Patients Only) Pre-Morbid Rankin Score: No symptoms Modified Rankin: Moderately severe disability     Balance Overall balance assessment: Needs assistance Sitting-balance support: Bilateral upper extremity supported, Feet supported Sitting balance-Leahy Scale: Fair Sitting balance - Comments: poor tolerance for LE movements when sitting EOB, dependent on BUE support Postural control: Posterior lean                                  Communication Communication Communication: No apparent difficulties  Cognition Arousal: Alert Behavior During Therapy: WFL for tasks assessed/performed, Anxious  PT - Cognitive impairments: Memory, Attention                       PT - Cognition Comments: increased time to respond, decreased awareness of bowel movements Following commands: Intact      Cueing Cueing Techniques: Verbal cues, Visual cues, Gestural cues  Exercises General Exercises - Lower Extremity Long Arc Quad: AROM, Both, 10 reps, Seated Hip Flexion/Marching: AROM,  Both, 5 reps, Seated Heel Raises: AROM, Both, 5 reps (limited by progression of dizziness)    General Comments General comments (skin integrity, edema, etc.): BP 140/80 upon return to bed with pt reporting dizziness      Pertinent Vitals/Pain Pain Assessment Pain Assessment: Faces Faces Pain Scale: Hurts a little bit Pain Location: back Pain Descriptors / Indicators: Discomfort, Grimacing, Guarding Pain Intervention(s): Limited activity within patient's tolerance, Monitored during session, Repositioned     PT Goals (current goals can now be found in the care plan section) Acute Rehab PT Goals Patient Stated Goal: get better and back to independence PT Goal Formulation: With patient Time For Goal Achievement: 10/12/24 Potential to Achieve Goals: Good Progress towards PT goals: Progressing toward goals    Frequency    Min 2X/week       AM-PAC PT 6 Clicks Mobility   Outcome Measure  Help needed turning from your back to your side while in a flat bed without using bedrails?: A Little Help needed moving from lying on your back to sitting on the side of a flat bed without using bedrails?: A Lot Help needed moving to and from a bed to a chair (including a wheelchair)?: A Lot Help needed standing up from a chair using your arms (e.g., wheelchair or bedside chair)?: A Lot Help needed to walk in hospital room?: Total Help needed climbing 3-5 steps with a railing? : Total 6 Click Score: 11    End of Session Equipment Utilized During Treatment: Gait belt Activity Tolerance: Patient tolerated treatment well Patient left: with call bell/phone within reach;with family/visitor present;in bed;with bed alarm set Nurse Communication: Mobility status (pt reports BM) PT Visit Diagnosis: Unsteadiness on feet (R26.81);Other abnormalities of gait and mobility (R26.89);Muscle weakness (generalized) (M62.81);Difficulty in walking, not elsewhere classified (R26.2)     Time: 8499-8477 PT  Time Calculation (min) (ACUTE ONLY): 22 min  Charges:    $Therapeutic Activity: 8-22 mins PT General Charges $$ ACUTE PT VISIT: 1 Visit                     Izetta Call, PT, DPT   Acute Rehabilitation Department Office [phone] Secure Chat Communication Preferred    Izetta JULIANNA Call 09/30/2024, 4:12 PM

## 2024-09-30 NOTE — Consult Note (Signed)
 NEUROLOGY CONSULT NOTE   Chief Complaint: Weakness and Confusion Requesting Provider: Sonjia Held, MD  History of Present Illness  [name] [name] is a 79 y.o. male a PMHx significant for HTN, HLD, T2DM, CAD, paroxysmal AFib (on home Eliquis  until yesterday), and valvular heart disease s/p dual-chamber pacemaker placement in November 2011 who presented to the hospital with two weeks of fever, generalized weakness, and confusion. Blood cultures subsequently grew Enterococcus faecalis and TEE subacute bacterial endocarditis.   Neurology was consulted after an MRI of the brain obtained yesterday demonstrated embolic-appearing lesions. The primary team is requesting guidance regarding anticoagulation management and further neurologic evaluation in the setting of recent Eliquis  use, active bacteremia, and suspected emboli.   The patient was found resting comfortably in bed, in no acute distress, with his brothers and niece (POA) present at the bedside.  Last known well: Multiple days ago-unclear IV thrombolysis: No-unclear/greater than 4.5 hours last known well EVT: No-unclear-greater than 24 hours last known well NIH stroke scale-1  ROS  Comprehensive ROS performed and pertinent positives documented in HPI   Past History   Past Medical History:  Diagnosis Date   Anticoagulant long-term use    eliquis --- managed by cardiology   Aortic valve insufficiency    cardiologist--- dr francyne;   01/ 2012  s/p AVR for severe AS , bicusipd;  last echo 12-25-2019  mild AR no stenosis, mean grandiant   BPH associated with nocturia    Coronary artery disease    cardiologist--- dr croitoru;   nuclear stress test 02-28-2007  normal perfusion no ischemia, nuclear ef 58%;   cardiac cath 06-30-2010  severe AS and nonobstructive disease involving LAD/ CFx/ RCA   Heart murmur    History of non-ST elevation myocardial infarction (NSTEMI) 06/2010   History of ventricular tachycardia 06/2010    nonsustained VT in setting NSTEMI; severe AS   Hypertension    Malignant neoplasm prostate (HCC) 07/2021   urologist--- dr gay:  dx 11/ 2022 Gleason 4+3, PSA 11.9   Mitral insufficiency    per echo 12-25-2019  mile regurg,  no stenosis, degenerative   Mixed dyslipidemia    Mobitz type 1 second degree AV block 06/2010   symptomatic syncope;  s/p PPM   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 patel   (01-13-2022  pt stated only checks sugar occasionally   Wears glasses     Past Surgical History:  Procedure Laterality Date   AORTIC VALVE REPLACEMENT  10/06/2010   @[facility]   by dr emeterio Celestia Staple Ease prosthesis   CARDIAC CATHETERIZATION  06/30/2010   @[facility]   ;   nonobstructive CAD, heavily ca+ AOV   GOLD SEED IMPLANT N/A 01/17/2022   Procedure: GOLD SEED IMPLANT;  Surgeon: Selma Donnice SAUNDERS, MD;  Location[facility];  Service: Urology;  Laterality: N/A;  30 MINS   LUMBAR DISC SURGERY     yrs ago   PERMANENT PACEMAKER INSERTION  07/21/2010   @[facility]  by dr  croitoru;  Medtronic   PPM GENERATOR CHANGEOUT N/A 05/23/2021   Procedure: PPM GENERATOR CHANGEOUT;  Surgeon: Francyne Headland, MD;  Location: [facility] INVASIVE CV LAB;  Service: Cardiovascular;  Laterality: N/A;   SPACE OAR INSTILLATION N/A 01/17/2022   Procedure: SPACE OAR INSTILLATION;  Surgeon: Selma Donnice SAUNDERS, MD;  Location: [facility];  Service: Urology;  Laterality: N/A;   TRANSESOPHAGEAL ECHOCARDIOGRAM (CATH LAB) N/A 09/29/2024   Procedure: TRANSESOPHAGEAL  ECHOCARDIOGRAM;  Surgeon: Ren Donley Joelle VEAR, MD;  Location: [facility] INVASIVE CV [facility];  Service: Cardiovascular;  Laterality: N/A;    Family History: History reviewed. No pertinent family history.  Social History  reports that he quit smoking about 31 years ago. His smoking use included cigarettes. He started smoking about 51 years ago. He quit smokeless tobacco use about 31 years ago.  His smokeless tobacco use included chew. He reports that he does not drink alcohol and does not use drugs.  Allergies[1]  Medications  Current Medications[2]  Vitals   Vitals:   09/29/24 1408 09/29/24 1950 09/30/24 0343 09/30/24 0736  BP: 137/71 (!) 168/78 (!) 159/92 (!) 146/82  Pulse: 95 93 86 91  Resp:  16 15 16   Temp: 97.7 F (36.5 C) 98.2 F (36.8 C) (!) 97.5 F (36.4 C) 98.5 F (36.9 C)  TempSrc: Oral Oral Oral Oral  SpO2: 96% 97% 94% 94%  Weight:      Height:        Body mass index is 27.06 kg/m.   Physical Exam   Constitutional: Appears well-developed and well-nourished.  Psych: Affect appropriate to situation.  Eyes: No scleral injection.  HENT: No OP obstruction.  Head: Normocephalic.  Cardiovascular: Normal rate and regular rhythm.  Respiratory: Effort normal, non-labored breathing.   Neurologic Examination  Neuro: Mental Status: Patient is awake, alert, oriented to person, place, month, year, and situation. Patient is able to give a coherent history with dysarthric speech No signs of aphasia or neglect Cranial Nerves: II: Visual Fields are full. Pupils are equal, round, and reactive to light.  III,IV, VI: EOMI without ptosis or diploplia.  V: Facial sensation is symmetric to light touch VII: Facial movement is symmetric.  VIII: Hearing is intact to voice X: Uvula elevates symmetrically, speech dysarthric  XII: tongue is midline without atrophy or fasciculations.  Motor: Tone is normal. Bulk is normal. 5/5 strength was present in all four extremities.   Sensory: Sensation is symmetric to light touch in the arms and legs.   Labs/Imaging/Neurodiagnostic studies   CBC:  Recent Labs  Lab Oct 22, 2024 0439 09/29/24 0516 09/30/24 0442  WBC 13.4* 16.3* 18.0*  NEUTROABS 10.4* 13.7*  --   HGB 13.7 13.9 13.7  HCT 40.5 40.1 39.1  MCV 91.2 89.7 89.7  PLT 194 223 252   Basic Metabolic Panel:  Lab Results  Component Value Date   NA 135 09/30/2024   K 3.1 (L) 09/30/2024   CO2 20 (L) 09/30/2024   GLUCOSE 144 (H) 09/30/2024   BUN 19 09/30/2024   CREATININE 0.50 (L) 09/30/2024   CALCIUM  7.8 (L) 09/30/2024   GFRNONAA >60 09/30/2024   GFRAA 99 12/08/2019   Lipid Panel:  Lab Results  Component Value Date   LDLCALC 86 12/27/2020   HgbA1c:  Lab Results  Component Value Date   HGBA1C 6.5 (H) 09/25/2024   INR  Lab Results  Component Value Date   INR 1.44 10/11/2010   APTT  Lab Results  Component Value Date   APTT (H) 10/06/2010  39        IF BASELINE aPTT IS ELEVATED, SUGGEST PATIENT RISK ASSESSMENT BE USED TO DETERMINE APPROPRIATE ANTICOAGULANT THERAPY.    CT Head without contrast(Personally reviewed): No acute intracranial abnormality  MRI Brain(Personally reviewed): Multiple acute and subacute infarcts involving left frontal lobe as well as single acute infarct in the right parietal lobe.  ASSESSMENT   [name] [name] is a 79 y.o. male with significant cardiovascular comorbidities including paroxysmal A Fib-on Eliquis  prior to arrival which was held within the last day or 2, CAD, valvular heart disease, and dual-chamber pacemaker, who is currently admitted for Enterococcus faecalis bacteremia with subacute symptoms of fever, generalized weakness, and cognitive brain fog.  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  On examination today, the patient has dysarthric speech without focal deficits, aphasia, or altered level of consciousness.  MR imaging reveals embolic pattern of acute and subacute looking  infarcts with the largest burden in the left frontal lobe but not limited to either the left hemisphere or left frontal lobe.  Given the endocarditis, the likely etiology of the strokes is cardioembolic.   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 -Telemetry - CTA Head and Neck -A1c is less than 7 -Obtain lipid panel - I would hold antiplatelets and anticoagulants for now.  There is always a concern for mycotic aneurysms with septic emboli-would wait for CTA head and neck to be completed prior to recommendations on antiplatelet/anticoagulant therapy. -Therapy assessments -Management of infective endocarditis per primary team and ID - N.p.o. until cleared by bedside swallow formal swallow evaluation - Stroke team will follow.  Plan discussed with Dr. Sonjia, hospitalist  ______________________________________________________________________    Signed, Alan Maiden, MD PGY1   Attending Neurohospitalist Addendum Patient seen and examined with APP/Resident. Agree with the history and physical as documented above. Agree with the plan as documented, which I helped formulate. I have independently reviewed the chart, obtained history, review of systems and examined the patient.I have personally reviewed pertinent head/neck/spine imaging (CT/MRI). Please feel free to call with any questions.  -- Eligio Lav, MD Neurologist [facility] Pager: [phone]      [1] No Known Allergies [2]  Current Facility-Administered Medications:    alum & mag hydroxide-simeth (MAALOX/MYLANTA) 200-200-20 MG/5ML suspension 30 mL, 30 mL, Oral, Q6H PRN, Swayze, Ava, DO, 30 mL at 09/30/24 0824   ampicillin  (OMNIPEN) 2 g in sodium chloride  0.9 % 100 mL IVPB, 2 g, Intravenous, Q4H, Fleeta Rothman, Jomarie SAILOR, MD, Last Rate: 300 mL/hr at 09/30/24  1303, 2 g at 09/30/24 1303   atorvastatin  (LIPITOR) tablet 40 mg, 40 mg, Oral, Daily, Sundil, Subrina, MD, 40 mg at 09/30/24 9175   cefTRIAXone  (ROCEPHIN ) 2 g in sodium chloride  0.9 % 100 mL IVPB, 2 g, Intravenous, Q12H, Fleeta Rothman, Jomarie SAILOR, MD, Last Rate: 200 mL/hr at 09/30/24 1001, 2 g at 09/30/24 1001   empagliflozin  (JARDIANCE ) tablet 10 mg, 10 mg, Oral, Daily, Sundil, Subrina, MD, 10 mg at 09/30/24 1003   feeding supplement (ENSURE PLUS HIGH PROTEIN) liquid 237 mL, 237 mL, Oral, BID BM, Swayze, Ava, DO, 237 mL at 09/29/24 1326   insulin  aspart (novoLOG ) injection 0-6 Units, 0-6 Units, Subcutaneous, TID WC, Sundil, Subrina, MD   metoprolol  tartrate (LOPRESSOR ) tablet 50 mg, 50 mg, Oral, BID, Sundil, Subrina, MD, 50 mg at 09/30/24 0824   oxyCODONE  (Oxy IR/ROXICODONE ) immediate release tablet 5 mg, 5  mg, Oral, Q6H PRN, Sundil, Subrina, MD, 5 mg at 09/29/24 1822   pantoprazole  (PROTONIX ) EC tablet 40 mg, 40 mg, Oral, Daily, Pokhrel, Laxman, MD, 40 mg at 09/30/24 9175   saccharomyces boulardii (FLORASTOR) capsule 250 mg, 250 mg, Oral, BID, Pokhrel, Laxman, MD, 250 mg at 09/30/24 1253   sodium chloride  flush (NS) 0.9 % injection 3 mL, 3 mL, Intravenous, Q12H, Sundil, Subrina, MD, 3 mL at 09/28/24 2241   sodium chloride  flush (NS) 0.9 % injection 3 mL, 3 mL, Intravenous, Q12H, Sundil, Subrina, MD, 3 mL at 09/30/24 9171   sodium chloride  flush (NS) 0.9 % injection 3 mL, 3 mL, Intravenous, PRN, Sundil, Subrina, MD   trimethobenzamide  (TIGAN ) injection 200 mg, 200 mg, Intramuscular, Q6H PRN, Sundil, Subrina, MD, 200 mg at 09/30/24 [phone]

## 2024-09-30 NOTE — Discharge Instructions (Signed)

## 2024-09-30 NOTE — Progress Notes (Signed)
 "        [facility]  Date of Admission:  09/24/2024      Total days of antibiotics 5           ASSESSMENT: [name] [name] is a 79 y.o. male admitted with:   Enterococcus Faecalis Bloodstream Infection -  PPM in situ -  Bioprosthetic Aortic valve endocarditis -  CNS Septic Emboli -  BCx (+) 1/14; Negative 1/16 preliminarily. He is tolerating dual therapy Ampicillin  + Ceftriaxone  for prosthetic valve endocarditis.  TEE note was updated to include that there IS a concern for infection involving the prosthetic valves comparing to previous studies.  MRI read also with multiple scattered infarcts bilaterally c/w septic emboli. Presume that his cardiac device is likely infected in this setting despite no obvious vegetations picked up on TEE. CT surgery evaluation is pending for further recommendations. Primary valve was done in 2012 with PPM around the same time.  - Continue IV ampicillin  + ceftriaxone   - Planning for dual lumen PICC line once blood cleared and surgical plans are set - FU EP recommendation  Slurring Speech -  Acute Embolic Infarctions -  Slurring speech otherwise no motor deficits and neurologically intact. Alt differential is embolic stroke r/t AFIb.  - D/W Dr. Deedra - holding off on [facility] due to increased risk for bleeding in this window in the setting of IE.   Medication Monitoring -  Creatinine normal range. Mild hyponatremia.  - Continue to monitor for any changes that would impact antibiotic recommendations.   Isolation Recommendations -  Universal / Standard  (No longer with diarrhea)    PLAN: - Continue IV ampicillin  + ceftriaxone   - Planning for dual lumen PICC line once blood cleared and surgical plans are set - FU EP recommendation - D/W Dr. Deedra - holding off on [facility] due to increased risk for bleeding in this window in the setting of IE.      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 Fleeta Trigt 09/2010   Status post placement of cardiac pacemaker   Type 2 diabetes mellitus without complication, without long-term current use of insulin  (HCC)   Essential hypertension   Paroxysmal atrial fibrillation (HCC)   Fever of unknown origin   History of complete AV block   Aortic stenosis s/p aortic valve placement   History of CAD (coronary artery disease)   Non-insulin  dependent type 2 diabetes mellitus (HCC)   Hypokalemia   Prosthetic valve endocarditis   Pacemaker infection    atorvastatin   40 mg Oral Daily   empagliflozin   10 mg Oral Daily   feeding supplement  237 mL Oral BID BM   insulin  aspart  0-6 Units Subcutaneous TID WC   metoprolol  tartrate  50 mg Oral BID   pantoprazole   40 mg Oral Daily   sodium chloride  flush  3 mL Intravenous Q12H   sodium chloride  flush  3 mL Intravenous Q12H    SUBJECTIVE: Family at the bedside. Doing well otherwise.   Review of Systems: Review of Systems  Constitutional:  Negative for chills and fever.  Respiratory: Negative.    Cardiovascular: Negative.   Genitourinary: Negative.   Skin:  Negative for rash.     Allergies[1]  OBJECTIVE: Vitals:   09/29/24 1408 09/29/24 1950 09/30/24 0343 09/30/24 0736  BP: 137/71 (!) 168/78 (!) 159/92 (!) 146/82  Pulse: 95 93 86 91  Resp:  16 15 16   Temp: 97.7 F (36.5 C) 98.2 F (36.8 C) (!) 97.5 F (36.4 C) 98.5 F (36.9 C)  TempSrc: Oral Oral Oral Oral  SpO2: 96% 97% 94% 94%  Weight:      Height:       Body mass index is 27.06 kg/m.  Physical Exam Vitals reviewed.  HENT:     Mouth/Throat:     Mouth: No oral lesions.     Dentition: Normal dentition. No dental caries.  Eyes:     General: No scleral icterus. Cardiovascular:     Rate and Rhythm: Normal rate and regular rhythm.     Heart sounds: Normal heart sounds.  Pulmonary:     Effort: Pulmonary effort is normal.     Breath sounds: Normal breath sounds.  Abdominal:      General: There is no distension.     Palpations: Abdomen is soft.     Tenderness: There is no abdominal tenderness.  Lymphadenopathy:     Cervical: No cervical adenopathy.  Skin:    General: Skin is warm and dry.     Findings: No rash.  Neurological:     Mental Status: He is alert and oriented to person, place, and time.     Lab Results Lab Results  Component Value Date   WBC 18.0 (H) 09/30/2024   HGB 13.7 09/30/2024   HCT 39.1 09/30/2024   MCV 89.7 09/30/2024   PLT 252 09/30/2024    Lab Results  Component Value Date   CREATININE 0.50 (L) 09/30/2024   BUN 19 09/30/2024   NA 135 09/30/2024   K 3.1 (L) 09/30/2024   CL 99 09/30/2024   CO2 20 (L) 09/30/2024    Lab Results  Component Value Date   ALT 41 09/24/2024   AST 41 09/24/2024   ALKPHOS 89 09/24/2024   BILITOT 1.2 09/24/2024     Microbiology: Recent Results (from the past 240 hours)  Resp panel by RT-PCR (RSV, Flu A&B, Covid) Anterior Nasal Swab     Status: None   Collection Time: 09/22/24 12:55 PM   Specimen: Anterior Nasal Swab  Result Value Ref Range Status   SARS Coronavirus 2 by RT PCR NEGATIVE NEGATIVE Final    Comment: (NOTE) SARS-CoV-2 target nucleic acids are NOT DETECTED.  The SARS-CoV-2 RNA is generally detectable in upper respiratory specimens during the acute phase of infection. The lowest concentration of SARS-CoV-2 viral copies this assay can detect is 138 copies/mL. A negative result does not preclude SARS-Cov-2 infection and should not be used as the sole basis for treatment or other patient management decisions. A negative result may occur with  improper specimen collection/handling, submission of specimen other than nasopharyngeal swab, presence of viral mutation(s) within the areas targeted by this assay, and inadequate number of viral copies(<138 copies/mL). A negative result must be combined with clinical observations, patient history, and epidemiological information. The  expected result is Negative.  Fact Sheet for Patients:  bloggercourse.com  Fact Sheet for Healthcare Providers:  seriousbroker.it  This test is no t yet approved or cleared by the [place]  FDA and  has been authorized for detection and/or diagnosis of SARS-CoV-2 by FDA under an Emergency Use Authorization (EUA). This EUA will remain  in effect (meaning this test can be used) for the duration of the COVID-19 declaration under Section 564(b)(1) of the Act, 21 U.S.C.section 360bbb-3(b)(1), unless the authorization is terminated  or revoked sooner.       Influenza A by PCR  NEGATIVE NEGATIVE Final   Influenza B by PCR NEGATIVE NEGATIVE Final    Comment: (NOTE) The Xpert Xpress SARS-CoV-2/FLU/RSV plus assay is intended as an aid in the diagnosis of influenza from Nasopharyngeal swab specimens and should not be used as a sole basis for treatment. Nasal washings and aspirates are unacceptable for Xpert Xpress SARS-CoV-2/FLU/RSV testing.  Fact Sheet for Patients: bloggercourse.com  Fact Sheet for Healthcare Providers: seriousbroker.it  This test is not yet approved or cleared by the [place]  FDA and has been authorized for detection and/or diagnosis of SARS-CoV-2 by FDA under an Emergency Use Authorization (EUA). This EUA will remain in effect (meaning this test can be used) for the duration of the COVID-19 declaration under Section 564(b)(1) of the Act, 21 U.S.C. section 360bbb-3(b)(1), unless the authorization is terminated or revoked.     Resp Syncytial Virus by PCR NEGATIVE NEGATIVE Final    Comment: (NOTE) Fact Sheet for Patients: bloggercourse.com  Fact Sheet for Healthcare Providers: seriousbroker.it  This test is not yet approved or cleared by the [place]  FDA and has been authorized for detection and/or  diagnosis of SARS-CoV-2 by FDA under an Emergency Use Authorization (EUA). This EUA will remain in effect (meaning this test can be used) for the duration of the COVID-19 declaration under Section 564(b)(1) of the Act, 21 U.S.C. section 360bbb-3(b)(1), unless the authorization is terminated or revoked.  Performed at [facility], [address] [place], [place] [address]   Resp panel by RT-PCR (RSV, Flu A&B, Covid) Anterior Nasal Swab     Status: None   Collection Time: 09/24/24 11:10 PM   Specimen: Anterior Nasal Swab  Result Value Ref Range Status   SARS Coronavirus 2 by RT PCR NEGATIVE NEGATIVE Final   Influenza A by PCR NEGATIVE NEGATIVE Final   Influenza B by PCR NEGATIVE NEGATIVE Final    Comment: (NOTE) The Xpert Xpress SARS-CoV-2/FLU/RSV plus assay is intended as an aid in the diagnosis of influenza from Nasopharyngeal swab specimens and should not be used as a sole basis for treatment. Nasal washings and aspirates are unacceptable for Xpert Xpress SARS-CoV-2/FLU/RSV testing.  Fact Sheet for Patients: bloggercourse.com  Fact Sheet for Healthcare Providers: seriousbroker.it  This test is not yet approved or cleared by the [place]  FDA and has been authorized for detection and/or diagnosis of SARS-CoV-2 by FDA under an Emergency Use Authorization (EUA). This EUA will remain in effect (meaning this test can be used) for the duration of the COVID-19 declaration under Section 564(b)(1) of the Act, 21 U.S.C. section 360bbb-3(b)(1), unless the authorization is terminated or revoked.     Resp Syncytial Virus by PCR NEGATIVE NEGATIVE Final    Comment: (NOTE) Fact Sheet for Patients: bloggercourse.com  Fact Sheet for Healthcare Providers: seriousbroker.it  This test is not yet approved or cleared by the [place]  FDA and has been authorized for detection  and/or diagnosis of SARS-CoV-2 by FDA under an Emergency Use Authorization (EUA). This EUA will remain in effect (meaning this test can be used) for the duration of the COVID-19 declaration under Section 564(b)(1) of the Act, 21 U.S.C. section 360bbb-3(b)(1), unless the authorization is terminated or revoked.  Performed at [facility], [address] [place], [place] [address]   Blood culture (routine x 2)     Status: Abnormal   Collection Time: 09/25/24  2:20 AM   Specimen: BLOOD  Result Value Ref Range Status   Specimen Description BLOOD SITE NOT SPECIFIED  Final  Special Requests   Final    BOTTLES DRAWN AEROBIC AND ANAEROBIC Blood Culture adequate volume   Culture  Setup Time   Final    GRAM POSITIVE COCCI IN BOTH AEROBIC AND ANAEROBIC BOTTLES CRITICAL RESULT CALLED TO, READ BACK BY AND VERIFIED WITH: PHARMD C. AMEND [phone] @ [phone] FH Performed at [facility], [address], [place], [place] [address]    Culture ENTEROCOCCUS FAECALIS (A)  Final   Report Status 09/27/2024 FINAL  Final   Organism ID, Bacteria ENTEROCOCCUS FAECALIS  Final      Susceptibility   Enterococcus faecalis - MIC*    AMPICILLIN  <=2 SENSITIVE Sensitive     VANCOMYCIN  1 SENSITIVE Sensitive     GENTAMICIN  SYNERGY SENSITIVE Sensitive     * ENTEROCOCCUS FAECALIS  Blood Culture ID Panel (Reflexed)     Status: Abnormal   Collection Time: 09/25/24  2:20 AM  Result Value Ref Range Status   Enterococcus faecalis DETECTED (A) NOT DETECTED Final    Comment: CRITICAL RESULT CALLED TO, READ BACK BY AND VERIFIED WITH: PHARMD C. AMEND [phone] @ 1638 FH    Enterococcus Faecium NOT DETECTED NOT DETECTED Final   Listeria monocytogenes NOT DETECTED NOT DETECTED Final   Staphylococcus species NOT DETECTED NOT DETECTED Final   Staphylococcus aureus (BCID) NOT DETECTED NOT DETECTED Final   Staphylococcus epidermidis NOT DETECTED NOT DETECTED Final   Staphylococcus lugdunensis NOT DETECTED NOT DETECTED  Final   Streptococcus species NOT DETECTED NOT DETECTED Final   Streptococcus agalactiae NOT DETECTED NOT DETECTED Final   Streptococcus pneumoniae NOT DETECTED NOT DETECTED Final   Streptococcus pyogenes NOT DETECTED NOT DETECTED Final   A.calcoaceticus-baumannii NOT DETECTED NOT DETECTED Final   Bacteroides fragilis NOT DETECTED NOT DETECTED Final   Enterobacterales NOT DETECTED NOT DETECTED Final   Enterobacter cloacae complex NOT DETECTED NOT DETECTED Final   Escherichia coli NOT DETECTED NOT DETECTED Final   Klebsiella aerogenes NOT DETECTED NOT DETECTED Final   Klebsiella oxytoca NOT DETECTED NOT DETECTED Final   Klebsiella pneumoniae NOT DETECTED NOT DETECTED Final   Proteus species NOT DETECTED NOT DETECTED Final   Salmonella species NOT DETECTED NOT DETECTED Final   Serratia marcescens NOT DETECTED NOT DETECTED Final   Haemophilus influenzae NOT DETECTED NOT DETECTED Final   Neisseria meningitidis NOT DETECTED NOT DETECTED Final   Pseudomonas aeruginosa NOT DETECTED NOT DETECTED Final   Stenotrophomonas maltophilia NOT DETECTED NOT DETECTED Final   Candida albicans NOT DETECTED NOT DETECTED Final   Candida auris NOT DETECTED NOT DETECTED Final   Candida glabrata NOT DETECTED NOT DETECTED Final   Candida krusei NOT DETECTED NOT DETECTED Final   Candida parapsilosis NOT DETECTED NOT DETECTED Final   Candida tropicalis NOT DETECTED NOT DETECTED Final   Cryptococcus neoformans/gattii NOT DETECTED NOT DETECTED Final   Vancomycin  resistance NOT DETECTED NOT DETECTED Final    Comment: Performed at [facility], [address] [place], [place] [address]  Blood culture (routine x 2)     Status: None   Collection Time: 09/25/24  7:02 PM   Specimen: BLOOD LEFT HAND  Result Value Ref Range Status   Specimen Description BLOOD LEFT HAND  Final   Special Requests   Final    BOTTLES DRAWN AEROBIC AND ANAEROBIC Blood Culture adequate volume   Culture   Final    NO GROWTH 5  DAYS Performed at [facility], [address], [place], [place] [address]    Report Status  09/30/2024 FINAL  Final  Culture, blood (Routine X 2) w Reflex to ID Panel     Status: None (Preliminary result)   Collection Time: 09/26/24 10:30 AM   Specimen: BLOOD RIGHT ARM  Result Value Ref Range Status   Specimen Description BLOOD RIGHT ARM  Final   Special Requests   Final    BOTTLES DRAWN AEROBIC AND ANAEROBIC Blood Culture adequate volume   Culture   Final    NO GROWTH 4 DAYS Performed at [facility], [address] [place], [place] [address]    Report Status PENDING  Incomplete  Culture, blood (Routine X 2) w Reflex to ID Panel     Status: None (Preliminary result)   Collection Time: 09/26/24 10:41 AM   Specimen: BLOOD  Result Value Ref Range Status   Specimen Description BLOOD BLOOD RIGHT HAND  Final   Special Requests   Final    BOTTLES DRAWN AEROBIC AND ANAEROBIC Blood Culture adequate volume   Culture   Final    NO GROWTH 4 DAYS Performed at [facility], [address], [place], [place] [address]    Report Status PENDING  Incomplete     Corean Fireman, MSN, NP-C [facility]  [email or web address] Pager: [phone] Office: [phone] [facility] Main Line: [phone] *Secure Chat Communication Welcome  Total Encounter Time: 20 m      [1] No Known Allergies  "

## 2024-09-30 NOTE — Consult Note (Addendum)
 "     [address]       [place] [address]             [phone]        OMAIR DETTMER [facility] Medical Record #[id number] Date of Birth: 23-Feb-1946  Referring: No ref. provider found Primary Care: [name], MD Primary Cardiologist:Mihai Croitoru, MD  Chief Complaint:    Chief Complaint  Patient presents with   Fever    History of Present Illness:    We are asked to see this 79 year old male in cardiothoracic surgical consultation with prosthetic aortic valve endocarditis.  He had a AVR in 2012 1 #21 Magna Ease placed by Dr. Obadiah.  He also has a permanent pacemaker.  He was admitted on 09/26/2023 with multiple complaints including generalized malaise, weakness, back pain and fevers.  He had a recent ER visit where he was treated for orthostatic hypotension and AKI responding well to intravenous fluids.  A blood culture revealed Enterococcus faecalis and he was started on presumptive antibiotics for presumptive endocarditis with the prosthetic valve and PPM.  A TEE shows mobile thickening on the valve which is felt to be concerning for endocarditis.  EP and ID consults have been obtained.  He has a history of atrial fibrillation on Eliquis .  With his mental status changes and MRI was also obtained which revealed many acute left frontal cortical and subcortical infarcts as well as a single acute infarct in the right parietal lobe.  There is also a remote lacunar infarct in the right coronary radiata and mild for age chronic microvascular ischemic changes.  An MRI of the spine was done on 09/25/2024 which showed no evidence of thoracic spine infection.  The patient is currently on ampicillin  and ceftriaxone .    Current Activity/ Functional Status: Patient is not independent with mobility/ambulation, transfers, ADL's, IADL's.   Zubrod Score: At the time of surgery this patients most appropriate activity status/level should be described as: []     0    Normal  activity, no symptoms []     1    Restricted in physical strenuous activity but ambulatory, able to do out light work [x]     2    Ambulatory and capable of self care, unable to do work activities, up and about                 more than 50%  Of the time                            []     3    Only limited self care, in bed greater than 50% of waking hours []     4    Completely disabled, no self care, confined to bed or chair []     5    Moribund  Past Medical History:  Diagnosis Date   Anticoagulant long-term use    eliquis --- managed by cardiology   Aortic valve insufficiency    cardiologist--- dr francyne;   01/ 2012  s/p AVR for severe AS , bicusipd;  last echo 12-25-2019  mild AR no stenosis, mean grandiant   BPH associated with nocturia    Coronary artery disease    cardiologist--- dr croitoru;   nuclear stress test 02-28-2007  normal perfusion no ischemia, nuclear ef 58%;   cardiac cath 06-30-2010  severe AS and nonobstructive disease involving LAD/ CFx/ RCA   Heart murmur  History of non-ST elevation myocardial infarction (NSTEMI) 06/2010   History of ventricular tachycardia 06/2010   nonsustained VT in setting NSTEMI; severe AS   Hypertension    Malignant neoplasm prostate (HCC) 07/2021   urologist--- dr gay:  dx 11/ 2022 Gleason 4+3, PSA 11.9   Mitral insufficiency    per echo 12-25-2019  mile regurg,  no stenosis, degenerative   Mixed dyslipidemia    Mobitz type 1 second degree AV block 06/2010   symptomatic syncope;  s/p PPM   Nonproliferative retinopathy of both eyes due to type 2 diabetes mellitus (HCC)    mild without edema   PAF (paroxysmal atrial fibrillation) (HCC)    followed by cardiology---- post op afib 01/ 2012   Presence of permanent cardiac pacemaker 07/21/2010   medtronic;  PPM dual chamber;  generator change 05-23-2021  medtronic azure xt device   RBBB (right bundle branch block with left anterior fascicular block)    Tinnitus of both ears    Type 2  diabetes mellitus [facility])    endocrinologist-- dr d. patel   (01-13-2022  pt stated only checks sugar occasionally   Wears glasses     Past Surgical History:  Procedure Laterality Date   AORTIC VALVE REPLACEMENT  10/06/2010   @[facility]   by dr emeterio Celestia Staple Ease prosthesis   CARDIAC CATHETERIZATION  06/30/2010   @[facility]   ;   nonobstructive CAD, heavily ca+ AOV   GOLD SEED IMPLANT N/A 01/17/2022   Procedure: GOLD SEED IMPLANT;  Surgeon: Selma Donnice SAUNDERS, MD;  Location[facility];  Service: Urology;  Laterality: N/A;  30 MINS   LUMBAR DISC SURGERY     yrs ago   PERMANENT PACEMAKER INSERTION  07/21/2010   @[facility]  by dr croitoru;  Medtronic   PPM GENERATOR CHANGEOUT N/A 05/23/2021   Procedure: PPM GENERATOR CHANGEOUT;  Surgeon: Francyne Headland, MD;  Location: [facility] INVASIVE CV LAB;  Service: Cardiovascular;  Laterality: N/A;   SPACE OAR INSTILLATION N/A 01/17/2022   Procedure: SPACE OAR INSTILLATION;  Surgeon: Selma Donnice SAUNDERS, MD;  Location: [facility];  Service: Urology;  Laterality: N/A;   TRANSESOPHAGEAL ECHOCARDIOGRAM (CATH LAB) N/A 09/29/2024   Procedure: TRANSESOPHAGEAL ECHOCARDIOGRAM;  Surgeon: Ren Donley Joelle VEAR, MD;  Location: [facility] INVASIVE CV LAB;  Service: Cardiovascular;  Laterality: N/A;    Tobacco Use History[1]  Social History   Substance and Sexual Activity  Alcohol Use No     Allergies[2]  Current Facility-Administered Medications  Medication Dose Route Frequency Provider Last Rate Last Admin   alum & mag hydroxide-simeth (MAALOX/MYLANTA) 200-200-20 MG/5ML suspension 30 mL  30 mL Oral Q6H PRN Swayze, Ava, DO   30 mL at 09/30/24 0824   ampicillin  (OMNIPEN) 2 g in sodium chloride  0.9 % 100 mL IVPB  2 g Intravenous Q4H Fleeta Rothman, Jomarie SAILOR, MD 300 mL/hr at 09/30/24 0341 2 g at 09/30/24 0341   atorvastatin  (LIPITOR) tablet 40 mg  40 mg Oral Daily Sundil, Subrina, MD   40 mg at 09/30/24 0824   cefTRIAXone  (ROCEPHIN ) 2 g in sodium chloride  0.9 %  100 mL IVPB  2 g Intravenous Q12H Fleeta Rothman, Jomarie SAILOR, MD 200 mL/hr at 09/29/24 2116 2 g at 09/29/24 2116   empagliflozin  (JARDIANCE ) tablet 10 mg  10 mg Oral Daily Sundil, Subrina, MD   10 mg at 09/28/24 0850   feeding supplement (ENSURE PLUS HIGH PROTEIN) liquid 237 mL  237 mL Oral BID BM Swayze, Ava, DO  237 mL at 09/29/24 1326   insulin  aspart (novoLOG ) injection 0-6 Units  0-6 Units Subcutaneous TID WC Sundil, Subrina, MD       metoprolol  tartrate (LOPRESSOR ) tablet 50 mg  50 mg Oral BID Sundil, Subrina, MD   50 mg at 09/30/24 0824   oxyCODONE  (Oxy IR/ROXICODONE ) immediate release tablet 5 mg  5 mg Oral Q6H PRN Sundil, Subrina, MD   5 mg at 09/29/24 1822   pantoprazole  (PROTONIX ) EC tablet 40 mg  40 mg Oral Daily Pokhrel, Laxman, MD   40 mg at 09/30/24 9175   sodium chloride  flush (NS) 0.9 % injection 3 mL  3 mL Intravenous Q12H Sundil, Subrina, MD   3 mL at 09/28/24 2241   sodium chloride  flush (NS) 0.9 % injection 3 mL  3 mL Intravenous Q12H Sundil, Subrina, MD   3 mL at 09/28/24 2241   sodium chloride  flush (NS) 0.9 % injection 3 mL  3 mL Intravenous PRN Sundil, Subrina, MD       trimethobenzamide  (TIGAN ) injection 200 mg  200 mg Intramuscular Q6H PRN Sundil, Subrina, MD   200 mg at 09/30/24 9660    Medications Prior to Admission  Medication Sig Dispense Refill Last Dose/Taking   amLODipine  (NORVASC ) 5 MG tablet Take 1 tablet (5 mg total) by mouth daily. (Patient taking differently: Take 5 mg by mouth daily.) 90 tablet 3 09/24/2024 Morning   apixaban  (ELIQUIS ) 5 MG TABS tablet TAKE 1 TABLET TWICE A DAY 180 tablet 2 09/24/2024 at  7:00 AM   atorvastatin  (LIPITOR) 40 MG tablet Take 1 tablet by mouth daily.   09/23/2024   empagliflozin  (JARDIANCE ) 25 MG TABS tablet Take 12.5 mg by mouth daily.   09/24/2024 Morning   metFORMIN (GLUCOPHAGE) 500 MG tablet Take 0.5 tablets by mouth 2 (two) times daily.   09/24/2024 Morning   metoprolol  tartrate (LOPRESSOR ) 50 MG tablet TAKE 1 TABLET TWICE A DAY 180  tablet 3 09/24/2024 Morning   Multiple Vitamins-Minerals (PRESERVISION AREDS 2) CAPS Take 1 capsule by mouth in the morning and at bedtime.   09/24/2024 Morning   telmisartan (MICARDIS) 80 MG tablet Take 80 mg by mouth daily. for high blood pressure   09/24/2024 Morning    History reviewed. No pertinent family history.   Review of Systems:   Review of Systems  Constitutional:  Positive for diaphoresis, fever and malaise/fatigue.  Cardiovascular:  Positive for leg swelling.  Gastrointestinal:  Positive for diarrhea and heartburn.  Musculoskeletal:  Positive for back pain and myalgias.  Neurological:  Positive for dizziness, speech change and weakness.        Physical Exam: BP (!) 146/82 (BP Location: Left Arm)   Pulse 91   Temp 98.5 F (36.9 C) (Oral)   Resp 16   Ht 5' 11 (1.803 m)   Wt 88 kg   SpO2 94%   BMI 27.06 kg/m    General appearance: alert, cooperative, fatigued, and no distress Head: Normocephalic, without obvious abnormality, atraumatic Neck: no adenopathy, no JVD, supple, symmetrical, trachea midline, thyroid not enlarged, symmetric, no tenderness/mass/nodules, and + carotid bruits Lymph nodes: Cervical, supraclavicular, and axillary nodes normal. Resp: clear to auscultation bilaterally Back: symmetric, no curvature. ROM normal. No CVA tenderness. Cardio: RRR, 2/6 systolic murmur GI: Soft, non tender, + distension, no obvious masses Extremities: minor ankle edema Neurologic: Grossly normal except some dysarthria, generalized weakness Poor dentition  Diagnostic Studies & Laboratory data:     Recent Radiology Findings:   MR BRAIN W WO  CONTRAST Result Date: 09/29/2024 EXAM: MRI BRAIN WITH AND WITHOUT CONTRAST 09/29/2024 04:19:34 PM TECHNIQUE: Multiplanar multisequence MRI of the head/brain was performed with and without the administration of intravenous contrast. CONTRAST: 8 mL of GADAVIST  COMPARISON: CT HEAD WITHOUT CONTRAST 09/28/2024 CLINICAL HISTORY: Rule  out stroke. 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 Single acute infarct in the right parietal lobe. Patchy T2 hyperintensity in the white matter is compatible with chronic microvascular ischemic change. Remote lacunar infarct in the right corona radiata. No acute intracranial hemorrhage. No mass effect or midline shift. No hydrocephalus. The sella is unremarkable. Normal flow voids. No abnormal enhancement. ORBITS: No significant abnormality. SINUSES: No significant abnormality. BONES AND SOFT TISSUES: Normal bone marrow signal. No soft tissue abnormality. IMPRESSION: 1. 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 2.  Single acute infarct in the right parietal lobe. 3. Remote lacunar infarct in the right corona radiata and mild for age chronic microvascular ischemic change. Electronically signed by: Glendia Molt MD 09/29/2024 06:59 PM EST RP Workstation: [id number]   ECHO TEE Result Date: 09/29/2024    TRANSESOPHOGEAL ECHO REPORT   Patient Name:   [name] [name] Date of Exam: 09/29/2024 Medical Rec #:  [id number]      Height:       71.0 in Accession #:    [id number]     Weight:       194.0 lb Date of Birth:  1946/08/08       BSA:          2.081 m Patient Age:    78 years       BP:           185/78 mmHg Patient Gender: M              HR:           106 bpm. Exam Location:  Inpatient Procedure: 3D Echo, Transesophageal Echo, Cardiac Doppler and Color Doppler            (Both Spectral and Color Flow Doppler were utilized during            procedure). Indications:     Subacute bacterial endocarditis  History:         Patient has prior history of Echocardiogram examinations, most                  recent 09/25/2024. CAD, Abnormal ECG and Pacemaker, Aortic Valve                  Disease, Arrythmias:Atrial Fibrillation and AV block,                  Signs/Symptoms:Fever; Risk Factors:Hypertension and Diabetes.                  Aortic stenosis. AVR.                   Aortic Valve: Magna bioprosthetic valve is present in the                  aortic position. Procedure Date: 09/2010.  Sonographer:     Ellouise Mose RDCS Referring Phys:  [id number] ZANE ADAMS Diagnosing Phys: Joelle Cedars Tonleu PROCEDURE: After discussion of the risks and benefits of a TEE, an informed consent was obtained from the patient. The transesophogeal probe was passed without difficulty through the esophogus of the patient. Imaged were obtained with the patient in a left lateral decubitus position. Sedation  performed by different physician. The patient was monitored while under deep sedation. Anesthestetic sedation was provided intravenously by Anesthesiology: 200mg  of Propofol , 50mg  of Lidocaine . The patient's vital signs; including heart rate, blood pressure, and oxygen saturation; remained stable throughout the procedure. Supplementary images were obtained from transthoracic windows as indicated to answer the clinical question. The patient developed no complications during the procedure.  IMPRESSIONS  1. No left atrial/left atrial appendage thrombus was detected. The LAA emptying velocity was 92 cm/s.  2. Right atrial size was Lead in RA.  3. Large pleural effusion in the left lateral region.  4. The mitral valve is degenerative. Mild mitral valve regurgitation. Moderate mitral stenosis. The mean mitral valve gradient is 8.0 mmHg. Moderate mitral annular calcification.  5. There is a mobile mass at 10/11 o'clock in the short axis (#31, #32, #36), likely at the tip of the leaflet w/ possible prosthetic valve stenosis with PV 3.44 m/sec, MG 26 mmHg, and DVI 0.30. The aortic valve has been repaired/replaced. Aortic valve regurgitation is not visualized. There is a Magna bioprosthetic valve present in the aortic position. Procedure Date: 09/2010.  6. Aortic dilatation noted. There is dilatation of the ascending aorta, measuring 43 mm.  7. 3D performed of the aortic valve. Comparison(s): Compared to prior TTE,  there is evidence of prosthetic valve stenosis with PV increasing from 2 to 3.4 m/sec, and MG increasing from 10 to 26. Conclusion(s)/Recommendation(s): Findings are concerning for vegetation/infective endocarditis as detailed above. FINDINGS  Left Ventricle: Left Atrium: No left atrial/left atrial appendage thrombus was detected. The LAA emptying velocity was 92 cm/s. Right Atrium: Right atrial size was Lead in RA. Pericardium: There is no evidence of pericardial effusion. Mitral Valve: The mitral valve is degenerative in appearance. There is moderate thickening of the mitral valve leaflet(s). There is moderate calcification of the mitral valve leaflet(s). Moderately decreased mobility of the mitral valve leaflets. Moderate mitral annular calcification. Mild mitral valve regurgitation. Moderate mitral valve stenosis. MV peak gradient, 13.5 mmHg. The mean mitral valve gradient is 8.0 mmHg with average heart rate of 105 bpm. Aortic Valve: There is a mobile mass at 10/11 o'clock in the short axis (#31, #32, #36), likely at the tip of the leaflet w/ possible prosthetic valve stenosis with PV 3.44 m/sec, MG 26 mmHg, and DVI 0.30. The aortic valve has been repaired/replaced. Aortic valve regurgitation is not visualized. Aortic valve mean gradient measures 26.0 mmHg. Aortic valve peak gradient measures 47.3 mmHg. Aortic valve area, by VTI measures 1.43 cm. There is a Magna bioprosthetic valve present in the aortic position. Procedure Date: 09/2010. Pulmonic Valve: Pulmonic valve regurgitation is mild. Aorta: Aortic dilatation noted. There is dilatation of the ascending aorta, measuring 43 mm. IAS/Shunts: There is redundancy of the interatrial septum. No atrial level shunt detected by color flow Doppler. Additional Comments: A device lead is visualized. There is a large pleural effusion in the left lateral region. Spectral Doppler performed. LEFT VENTRICLE PLAX 2D LVOT diam:     2.30 cm LV SV:         64 LV SV Index:   31  LVOT Area:     4.15 cm  AORTIC VALVE AV Area (Vmax):    1.25 cm AV Area (Vmean):   1.51 cm AV Area (VTI):     1.43 cm AV Vmax:           344.00 cm/s AV Vmean:          185.000 cm/s AV VTI:  0.448 m AV Peak Grad:      47.3 mmHg AV Mean Grad:      26.0 mmHg LVOT Vmax:         103.77 cm/s LVOT Vmean:        67.100 cm/s LVOT VTI:          0.154 m LVOT/AV VTI ratio: 0.34  AORTA Ao Asc diam: 4.30 cm MITRAL VALVE MV Area VTI:  2.41 cm    SHUNTS MV Peak grad: 13.5 mmHg   Systemic VTI:  0.15 m MV Mean grad: 8.0 mmHg    Systemic Diam: 2.30 cm MV Vmax:      1.84 m/s MV Vmean:     135.0 cm/s Joelle Cedars Tonleu Electronically signed by Joelle Cedars Tonleu Signature Date/Time: 09/29/2024/2:00:09 PM    Final (Updated)    EP STUDY Result Date: 09/29/2024 See surgical note for result.  CT HEAD WO CONTRAST ( ) Result Date: 09/28/2024 EXAM: CT HEAD WITHOUT CONTRAST 09/28/2024 11:17:03 AM TECHNIQUE: CT of the head was performed without the administration of intravenous contrast. Automated exposure control, iterative reconstruction, and/or weight based adjustment of the mA/kV was utilized to reduce the radiation dose to as low as reasonably achievable. COMPARISON: 09/22/2024 CLINICAL HISTORY: stroke stroke stroke stroke stroke FINDINGS: BRAIN AND VENTRICLES: No acute hemorrhage. No evidence of acute infarct. No hydrocephalus. No extra-axial collection. No mass effect or midline shift. Scattered supratentorial white matter hypoattenuation foci, likely chronic microvascular ischemic changes are stable. Mild generalized atrophy is also stable. Atherosclerotic calcifications are present in the cavernous carotid arteries bilaterally and at the dural margin of both vertebral arteries. Carotid siphon vascular calcifications. No hyperdense vessel is present. ORBITS: No acute abnormality. SINUSES: No acute abnormality. SOFT TISSUES AND SKULL: No acute soft tissue abnormality. No skull fracture. IMPRESSION: 1. No acute  intracranial abnormality. Electronically signed by: Lonni Necessary MD 09/28/2024 11:30 AM EST RP Workstation: [id number]     I have independently reviewed the above radiologic studies and discussed with the patient   Recent Lab Findings: Lab Results  Component Value Date   WBC 18.0 (H) 09/30/2024   HGB 13.7 09/30/2024   HCT 39.1 09/30/2024   PLT 252 09/30/2024   GLUCOSE 144 (H) 09/30/2024   CHOL 142 12/27/2020   TRIG 108 12/27/2020   HDL 36 (L) 12/27/2020   LDLCALC 86 12/27/2020   ALT 41 09/24/2024   AST 41 09/24/2024   NA 135 09/30/2024   K 3.1 (L) 09/30/2024   CL 99 09/30/2024   CREATININE 0.50 (L) 09/30/2024   BUN 19 09/30/2024   CO2 20 (L) 09/30/2024   TSH 4.175 06/28/2010   INR 1.44 10/11/2010   HGBA1C 6.5 (H) 09/25/2024      Assessment / Plan: 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  Bacteremia with Enterococcus faecalis.  Valve is a 21 mm Magna Ease prosthetic placed in 2012 History of second-degree A-V block Mobitz type I status post PPM DM2 Mixed hyperlipidemia Essential hypertension History of paroxysmal atrial fibrillation on OAC Poor dentition-will require dental clearance prior to proceeding with any type of surgical intervention History of prostate cancer History of cataracts retinopathy History CAD   Plan: Patient will be seen by the surgeon who will evaluate studies.  He would likely require cardiac catheterization, removal of PPM, and dental clearance/extractions/ prior to proceeding with any type of valve surgery. There is a good possibility the patient is not a candidate for surgery       I  spent 30 minutes counseling the patient face to face.   Wayne E Gold, PA-C  09/30/2024 8:25 AM    Chart reviewed, patient examined, agree with above. He says he was feeling fine until 5-6 weeks ago and then developed fever to 101, night sweats and chills and generally feeling poorly. Now has Enterococcus faecalis  bacteremia, PVE of pericardial aortic valve placed in 2012 complicated by multiple embolic strokes. He has a pacing system that could be infected. TEE showed mobile vegetation on the prosthetic aortic valve, moderate AS with mean gradient of 26 which is higher than it had been in the past, no AI. Aorta dilated to 4.3 cm which is not unusual given his hx of bicuspid aortic valve. No sign of perivalvular abscess on TEE but EP has some concerns due to new high RV pacing. He has moderate MAC with mean gradient of 8 mm Hg consistent with moderate MS.   I don't think this gentleman is a candidate for redo AVR, possible root replacement at 79 years old with multiple embolic strokes, significant MV disease and ascending aortic aneurysm. He would not survive that. His valve is currently functioning adequately. I would continue antibiotic therapy to see if that will improve/resolve the infection and if not, palliative care. I discussed this with him.   Dorise LOIS Fellers, MD    [1]  Social History Tobacco Use  Smoking Status Former   Current packs/day: 0.00   Types: Cigarettes   Start date: 51   Quit date: 1995   Years since quitting: 31.0  Smokeless Tobacco Former   Types: Chew   Quit date: 1995  [2] No Known Allergies  "

## 2024-09-30 NOTE — Progress Notes (Signed)
" ° °  Inpatient Rehab Admissions Coordinator :  Per therapy change in recommendations patient was screened for CIR candidacy by Heron Leavell RN MSN. Patient is not at a level to tolerate the intensity required to pursue a CIR admit. Would also need supervision after a CIR admit due to his cognition status. The CIR admissions team will follow and monitor for progress and place a Rehab Consult order if felt to be appropriate. Please contact me with any questions.  Heron Leavell RN MSN Admissions Coordinator [phone]  "

## 2024-10-01 ENCOUNTER — Other Ambulatory Visit: Payer: Self-pay

## 2024-10-01 DIAGNOSIS — Z952 Presence of prosthetic heart valve: Secondary | ICD-10-CM | POA: Diagnosis not present

## 2024-10-01 DIAGNOSIS — Z8679 Personal history of other diseases of the circulatory system: Secondary | ICD-10-CM | POA: Diagnosis not present

## 2024-10-01 DIAGNOSIS — T826XXA Infection and inflammatory reaction due to cardiac valve prosthesis, initial encounter: Secondary | ICD-10-CM | POA: Diagnosis not present

## 2024-10-01 DIAGNOSIS — I330 Acute and subacute infective endocarditis: Secondary | ICD-10-CM | POA: Diagnosis not present

## 2024-10-01 DIAGNOSIS — Z950 Presence of cardiac pacemaker: Secondary | ICD-10-CM | POA: Diagnosis not present

## 2024-10-01 DIAGNOSIS — I76 Septic arterial embolism: Secondary | ICD-10-CM | POA: Diagnosis not present

## 2024-10-01 DIAGNOSIS — E785 Hyperlipidemia, unspecified: Secondary | ICD-10-CM | POA: Diagnosis not present

## 2024-10-01 DIAGNOSIS — T827XXD Infection and inflammatory reaction due to other cardiac and vascular devices, implants and grafts, subsequent encounter: Secondary | ICD-10-CM | POA: Diagnosis not present

## 2024-10-01 DIAGNOSIS — I480 Paroxysmal atrial fibrillation: Secondary | ICD-10-CM | POA: Diagnosis not present

## 2024-10-01 DIAGNOSIS — R7881 Bacteremia: Secondary | ICD-10-CM | POA: Diagnosis not present

## 2024-10-01 DIAGNOSIS — I10 Essential (primary) hypertension: Secondary | ICD-10-CM | POA: Diagnosis not present

## 2024-10-01 DIAGNOSIS — I350 Nonrheumatic aortic (valve) stenosis: Secondary | ICD-10-CM | POA: Diagnosis not present

## 2024-10-01 DIAGNOSIS — B952 Enterococcus as the cause of diseases classified elsewhere: Secondary | ICD-10-CM | POA: Diagnosis not present

## 2024-10-01 DIAGNOSIS — R29701 NIHSS score 1: Secondary | ICD-10-CM | POA: Diagnosis not present

## 2024-10-01 DIAGNOSIS — A498 Other bacterial infections of unspecified site: Secondary | ICD-10-CM | POA: Diagnosis not present

## 2024-10-01 DIAGNOSIS — I6340 Cerebral infarction due to embolism of unspecified cerebral artery: Secondary | ICD-10-CM | POA: Diagnosis not present

## 2024-10-01 DIAGNOSIS — E119 Type 2 diabetes mellitus without complications: Secondary | ICD-10-CM | POA: Diagnosis not present

## 2024-10-01 DIAGNOSIS — I69391 Dysphagia following cerebral infarction: Secondary | ICD-10-CM | POA: Diagnosis not present

## 2024-10-01 DIAGNOSIS — T826XXD Infection and inflammatory reaction due to cardiac valve prosthesis, subsequent encounter: Secondary | ICD-10-CM | POA: Diagnosis not present

## 2024-10-01 LAB — LIPID PANEL
Cholesterol: 87 mg/dL (ref 0–200)
HDL: 16 mg/dL — ABNORMAL LOW
LDL Cholesterol: 47 mg/dL (ref 0–99)
Total CHOL/HDL Ratio: 5.4 ratio
Triglycerides: 122 mg/dL
VLDL: 24 mg/dL (ref 0–40)

## 2024-10-01 LAB — BASIC METABOLIC PANEL WITH GFR
Anion gap: 17 — ABNORMAL HIGH (ref 5–15)
BUN: 18 mg/dL (ref 8–23)
CO2: 21 mmol/L — ABNORMAL LOW (ref 22–32)
Calcium: 7.8 mg/dL — ABNORMAL LOW (ref 8.9–10.3)
Chloride: 100 mmol/L (ref 98–111)
Creatinine, Ser: 0.51 mg/dL — ABNORMAL LOW (ref 0.61–1.24)
GFR, Estimated: >60 mL/min
Glucose, Bld: 95 mg/dL (ref 70–99)
Potassium: 3.3 mmol/L — ABNORMAL LOW (ref 3.5–5.1)
Sodium: 137 mmol/L (ref 135–145)

## 2024-10-01 LAB — GLUCOSE, CAPILLARY
Glucose-Capillary: 99 mg/dL (ref 70–99)
Glucose-Capillary: 108 mg/dL — ABNORMAL HIGH (ref 70–99)
Glucose-Capillary: 93 mg/dL (ref 70–99)
Glucose-Capillary: 86 mg/dL (ref 70–99)

## 2024-10-01 LAB — CULTURE, BLOOD (ROUTINE X 2)
Culture: NO GROWTH
Culture: NO GROWTH
Special Requests: ADEQUATE
Special Requests: ADEQUATE

## 2024-10-01 MED ORDER — AMLODIPINE BESYLATE 5 MG PO TABS
5.0000 mg | ORAL_TABLET | Freq: Every day | ORAL | Status: DC
  Administered 2024-10-01 – 2024-10-03 (×3): 5 mg via ORAL
  Filled 2024-10-01 (×3): qty 1

## 2024-10-01 MED ORDER — POTASSIUM CHLORIDE CRYS ER 20 MEQ PO TBCR
40.0000 meq | EXTENDED_RELEASE_TABLET | Freq: Once | ORAL | Status: AC
  Administered 2024-10-01: 40 meq via ORAL
  Filled 2024-10-01: qty 2

## 2024-10-01 MED ORDER — CHLORHEXIDINE GLUCONATE CLOTH 2 % EX PADS
6.0000 | MEDICATED_PAD | Freq: Every day | CUTANEOUS | Status: DC
  Administered 2024-10-01 – 2024-10-03 (×3): 6 via TOPICAL

## 2024-10-01 MED ORDER — LOPERAMIDE HCL 2 MG PO CAPS
2.0000 mg | ORAL_CAPSULE | Freq: Once | ORAL | Status: AC
  Administered 2024-10-01: 2 mg via ORAL
  Filled 2024-10-01: qty 1

## 2024-10-01 MED ORDER — SODIUM CHLORIDE 0.9% FLUSH
10.0000 mL | Freq: Two times a day (BID) | INTRAVENOUS | Status: DC
  Administered 2024-10-01 – 2024-10-03 (×4): 10 mL

## 2024-10-01 MED ORDER — APIXABAN 5 MG PO TABS
5.0000 mg | ORAL_TABLET | Freq: Two times a day (BID) | ORAL | Status: DC
  Administered 2024-10-01 – 2024-10-03 (×5): 5 mg via ORAL
  Filled 2024-10-01 (×5): qty 1

## 2024-10-01 MED ORDER — GERHARDT'S BUTT CREAM
1.0000 | TOPICAL_CREAM | Freq: Two times a day (BID) | CUTANEOUS | Status: DC
  Administered 2024-10-02 – 2024-10-03 (×3): 1 via TOPICAL
  Filled 2024-10-01: qty 60

## 2024-10-01 MED ORDER — SODIUM CHLORIDE 0.9% FLUSH: 10.0000 mL | INTRAVENOUS | Status: DC | PRN

## 2024-10-01 NOTE — Progress Notes (Signed)
 Physical Therapy Treatment Patient Details Name: [name] [name] MRN: [id number] DOB: 03-18-1946 Today's Date: 10/01/2024   History of Present Illness 79 y.o. male presents 09/24/24 with c/o fever, generalized weakness, and brain fog since 09/07/24. Seen at [facility] ED found to have hypotension and AKI. Pt with c/o back pain, MRIs performed of the thoracic and lumbar spine without evidence of infection. Blood cultures now positive for Enterococcus faecalis. 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    PT Comments  Arrive to room w/ OT, pt requesting assist with cleanup after BM. Mod I for rolling w/ rail, modA for supine>sit transfer. No dizziness or adverse symptoms at EOB, HR 103, BP 172/83. modA STS RW, minA for stand step transfer to recliner. Ambulation deferred given loose stools in standing, OT assisting with pericare, pt able to maintain standing balance w/ CGA and RW support. HR 98 BP 136/83 once pt repositioned in recliner, again denies adverse symptoms. Tolerates session well overall, primarily limited by loose stools in standing. Recommend continuing to follow with PT as able in acute setting, post acute PT <3 hours a day to maximize safety and reduce fall risk.     If plan is discharge home, recommend the following: A lot of help with walking and/or transfers;A lot of help with bathing/dressing/bathroom;Assist for transportation;Help with stairs or ramp for entrance   Can travel by private vehicle     No  Equipment Recommendations  Rolling walker (2 wheels);BSC/3in1;Wheelchair (measurements PT)    Recommendations  for Other Services       Precautions / Restrictions Precautions Precautions: Fall Recall of Precautions/Restrictions: Intact Precaution/Restrictions Comments: watch BP Restrictions Weight Bearing Restrictions Per Provider Order: No     Mobility  Bed Mobility Overal bed mobility: Needs Assistance Bed Mobility: Supine to Sit, Rolling Rolling: Modified independent (Device/Increase time), Used rails (with rails, cues for sequencing and positioning LLE with roll to R)   Supine to sit: Mod assist     General bed mobility comments: rolling for pericare; mod A for supine>sit to EOB with HOB raised. able to initiate well with cues for mechancis but requires assist for full truncal upright, cues for UE management and heavy assist for scooting fully EOB    Transfers Overall transfer level: Needs assistance Equipment used: Rolling walker (2 wheels) Transfers: Sit to/from Stand, Bed to chair/wheelchair/BSC Sit to Stand: Mod assist, +2 safety/equipment   Step pivot transfers: +2 safety/equipment, Min assist       General transfer comment: modA for STS from raised bed; minA for step pivot to recliner. +2 present for assist with lines/leads and safety given recent concern for orthostatics. Cues for sequencing/safety throughout.    Ambulation/Gait                   Stairs             Wheelchair Mobility     Tilt Bed    Modified Rankin (Stroke Patients Only)       Balance Overall balance assessment: Needs assistance Sitting-balance support: Feet supported, No upper extremity supported Sitting balance-Leahy Scale: Fair     Standing balance support: Bilateral upper extremity supported Standing balance-Leahy Scale: Poor Standing balance  comment: dependent on external support                            Communication Communication Communication: No apparent difficulties  Cognition Arousal: Alert Behavior During Therapy: WFL for tasks  assessed/performed                             Following commands: Impaired Following commands impaired: Follows one step commands with increased time    Cueing Cueing Techniques: Verbal cues, Tactile cues  Exercises      General Comments General comments (skin integrity, edema, etc.): tolerates session well overall. see assessment for vitals; denies dizziness or adverse symptoms. session limited by loose stools upon standing.      Pertinent Vitals/Pain Pain Assessment Pain Assessment: No/denies pain    Home Living                          Prior Function            PT Goals (current goals can now be found in the care plan section) Acute Rehab PT Goals PT Goal Formulation: With patient Time For Goal Achievement: 10/12/24 Potential to Achieve Goals: Good Progress towards PT goals: Progressing toward goals    Frequency    Min 2X/week      PT Plan      Co-evaluation PT/OT/SLP Co-Evaluation/Treatment: Yes Reason for Co-Treatment: To address functional/ADL transfers;For patient/therapist safety PT goals addressed during session: Mobility/safety with mobility;Balance        AM-PAC PT 6 Clicks Mobility   Outcome Measure  Help needed turning from your back to your side while in a flat bed without using bedrails?: A Little Help needed moving from lying on your back to sitting on the side of a flat bed without using bedrails?: A Lot Help needed moving to and from a bed to a chair (including a wheelchair)?: A Lot Help needed standing up from a chair using your arms (e.g., wheelchair or bedside chair)?: A Lot Help needed to walk in hospital room?: Total Help needed climbing 3-5 steps with a railing? : Total 6 Click Score: 11    End of Session Equipment Utilized During Treatment: Gait belt Activity Tolerance: Patient tolerated treatment well Patient left: in chair;with call bell/phone within reach;with chair alarm set;with SCD's  reapplied Nurse Communication: Mobility status PT Visit Diagnosis: Unsteadiness on feet (R26.81);Other abnormalities of gait and mobility (R26.89);Muscle weakness (generalized) (M62.81);Difficulty in walking, not elsewhere classified (R26.2)     Time: 9069-9043 PT Time Calculation (min) (ACUTE ONLY): 26 min  Charges:    $Therapeutic Activity: 8-22 mins PT General Charges $$ ACUTE PT VISIT: 1 Visit                     Alm DELENA Jenny PT, DPT 10/01/2024 11:05 AM

## 2024-10-01 NOTE — Progress Notes (Signed)
 PHARMACY CONSULT NOTE FOR:  OUTPATIENT  PARENTERAL ANTIBIOTIC THERAPY (OPAT)  Indication: Enterococcus faecalis AV endocarditis Regimen: Ampicillin  12g IV every 24 hours continuous infusion + ceftriaxone  2g IV every 12 hours  End date: 11/07/2024  IV antibiotic discharge orders are pended. To discharging provider:  please sign these orders via discharge navigator,  Select New Orders & click on the button choice - Manage This Unsigned Work.    Thank you for allowing pharmacy to be a part of this patient's care.  Feliciano Close, PharmD PGY2 Infectious Diseases Pharmacy Resident

## 2024-10-01 NOTE — Progress Notes (Signed)
 Swallow Screen preformed by this nurse pt was able to perform each 3oz water challenge along with intake of applesauce and cracker with no stopping, choking or cough  10/01/24 0500  Pre-Screen- If YES to any of the following, STOP the screen, keep NPO, and place order for SLP eval and treat.   Home diet required thickened liquids No - Proceed  Trach tube present No - Proceed  Radiation to Head/Neck No - Proceed  Patient Readiness- If YES to any of the following, WAIT to screen, keep NPO, and place order for SLP eval and treat. May rescreen if clinical improvement WITHIN 24h.    Is patient lethargic or unable to stay alert/awake? No - Proceed  HOB restricted to <30 degrees No - Proceed  NPO for planned procedure No - Proceed  Aspiration Risk Assessment  Is patient oriented to name, place, or year? Yes - Proceed  Able to open mouth, stick out tongue, or smile Yes - Proceed  Able to seal lips Yes - Proceed  Able to move tongue from side to side Yes - Proceed  Face is symmetric Yes - Proceed  Mandatory oral care performed Yes  3 oz Water Challenge  Does the patient stop drinking? No - Proceed  Does the patient cough/choke? No - Proceed  Document PASS or FAIL PASS- Obtain Diet Order

## 2024-10-01 NOTE — Plan of Care (Signed)
" °  Problem: Coping: Goal: Ability to adjust to condition or change in health will improve Outcome: Progressing   Problem: Fluid Volume: Goal: Ability to maintain a balanced intake and output will improve Outcome: Progressing   Problem: Education: Goal: Knowledge of General Education information will improve Description: Including pain rating scale, medication(s)/side effects and non-pharmacologic comfort measures Outcome: Progressing   Problem: Health Behavior/Discharge Planning: Goal: Ability to manage health-related needs will improve Outcome: Progressing   Problem: Clinical Measurements: Goal: Ability to maintain clinical measurements within normal limits will improve Outcome: Progressing   Problem: Coping: Goal: Level of anxiety will decrease Outcome: Progressing   Problem: Education: Goal: Knowledge of disease or condition will improve Outcome: Progressing Goal: Knowledge of secondary prevention will improve (MUST DOCUMENT ALL) Outcome: Progressing Goal: Knowledge of patient specific risk factors will improve (DELETE if not current risk factor) Outcome: Progressing   Problem: Ischemic Stroke/TIA Tissue Perfusion: Goal: Complications of ischemic stroke/TIA will be minimized Outcome: Progressing   Problem: Coping: Goal: Will verbalize positive feelings about self Outcome: Progressing Goal: Will identify appropriate support needs Outcome: Progressing   Problem: Health Behavior/Discharge Planning: Goal: Ability to manage health-related needs will improve Outcome: Progressing Goal: Goals will be collaboratively established with patient/family Outcome: Progressing   Problem: Self-Care: Goal: Ability to participate in self-care as condition permits will improve Outcome: Progressing Goal: Verbalization of feelings and concerns over difficulty with self-care will improve Outcome: Progressing Goal: Ability to communicate needs accurately will improve Outcome:  Progressing   Problem: Nutrition: Goal: Risk of aspiration will decrease Outcome: Progressing Goal: Dietary intake will improve Outcome: Progressing   "

## 2024-10-01 NOTE — Progress Notes (Signed)
 "        [facility]  Date of Admission:  09/24/2024      Total days of antibiotics 6           ASSESSMENT: [name] [name] is a 79 y.o. male admitted with:   Enterococcus Faecalis Bloodstream Infection -  PPM in situ -  Bioprosthetic Aortic valve endocarditis -  CNS Septic Emboli -  BCx (+) 1/14; Negative 1/16 preliminarily. He is tolerating dual therapy Ampicillin  + Ceftriaxone  for prosthetic valve endocarditis.  TEE note was updated to include that there IS a concern for infection involving the prosthetic valves comparing to previous studies.  MRI read also with multiple scattered infarcts bilaterally c/w septic emboli. Presume that his cardiac device is likely infected in this setting despite no obvious vegetations picked up on TEE. CT surgery evaluation is pending for further recommendations. Primary valve was done in 2012 with PPM around the same time.  -It makes sense to avoid device extraction / reimplantation morbidity when the recommendation to suppress with chronic amoxicillin stays the same, which could be successful. - Continue IV ampicillin  + ceftriaxone   - Ordered dual lumen PICC line today  - will proceed with attempt at lifelong suppression with amoxicillin PO after induction treatment  - appointments personally coordinated for 3 week check in and at EOT for conversion to PO   Diarrhea -  Worsening in the setting of abx. No abdominal discomfort but too weak to get up timely so incontinence events noted. Soft smears not watery.  No focal abdominal symptoms.  - Probiotics OK to try - Will put order for PRN imodium  as well  Slurring Speech -  Acute Embolic Infarctions -  Slurring speech otherwise no motor deficits and neurologically intact. Alt differential is embolic stroke r/t AFIb.  - no mycotic aneurysm concern. Resuming anticoagulation per CTA results.   Medication Monitoring -  Creatinine normal range. Mild hyponatremia.  -  Continue to monitor for any changes that would impact antibiotic recommendations.   Vascular Access -  -PICC ordered - needs DL placement - ordered today.  -Home health orders to maintain PICC line care and education for patient described below   Discharge Planning / Coordination of Care -  -Outpatient antibiotics set -looks like SNF / rehab (not CIR)   Medication Monitoring -  -Safety labs ordered and detailed below to be followed in OPAT clinic  Isolation Recommendations -  Universal / Standard   ID will sign off - please call back with any questions/concerns or if we can be of further assistance.    PLAN: - Continue IV ampicillin  + ceftriaxone   - Planning for dual lumen PICC line once blood cleared and surgical plans are set - appt coordination  - discharge planning looks like SNF / rehab setting - Probiotics OK to try - Will put order for PRN imodium  as well - no mycotic aneurysm concern. Resuming anticoagulation per CTA results per neurology    OPAT ORDERS:  Diagnosis: prosthetic AV endocarditis  Culture Result: enterococcus faecalis   Allergies[1]   Discharge antibiotics to be given via PICC line:  Ampicillin  12 gm IV Q24h via continuous infusion   +   Ceftriaxone  2 gm IV Q12 hour injections   Duration: 6 weeks from negative blood cx   End Date: 11/07/2024  [facility] Care Per Protocol with Biopatch Use: Home health RN for IV administration and teaching, line care and labs.    Labs weekly while on IV  antibiotics: _x_ CBC with differential __ BMP **TWICE WEEKLY ON VANCOMYCIN   _x_ CMP _x_ CRP __ ESR __ Vancomycin  trough TWICE WEEKLY __ CK  _x_ Please pull PIC at completion of IV antibiotics __ Please leave PIC in place until doctor has seen patient or been notified  Fax weekly labs to [phone]  Clinic Follow Up Appt: 10/27/2024 @ 1:30 pm with Dr. 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 Fleeta Trigt 09/2010   Status post placement of cardiac pacemaker   Type 2 diabetes mellitus without complication, without long-term current use of insulin  (HCC)   Essential hypertension   Paroxysmal atrial fibrillation (HCC)   Fever of unknown origin   History of complete AV block   Aortic stenosis s/p aortic valve placement   History of CAD (coronary artery disease)   Non-insulin  dependent type 2 diabetes mellitus (HCC)   Hypokalemia   Prosthetic valve endocarditis   Pacemaker infection    atorvastatin   40 mg Oral Daily   empagliflozin   10 mg Oral Daily   feeding supplement  237 mL Oral BID BM   insulin  aspart  0-6 Units Subcutaneous TID WC   metoprolol  tartrate  50 mg Oral BID   pantoprazole   40 mg Oral Daily   sodium chloride  flush  3 mL Intravenous Q12H   sodium chloride  flush  3 mL Intravenous Q12H    SUBJECTIVE: Family at the bedside. Doing well otherwise.   Review of Systems: Review of Systems  Constitutional:  Negative for chills and fever.  Respiratory: Negative.    Cardiovascular: Negative.   Genitourinary: Negative.   Skin:  Negative for rash.     Allergies[2]  OBJECTIVE: Vitals:   09/30/24 0736 09/30/24 1423 09/30/24 1944 10/01/24 0817  BP: (!) 146/82 (!) 156/74 (!) 140/70 (!) 169/77  Pulse: 91 89 98 90  Resp: 16 16 17 16   Temp: 98.5 F (36.9 C) 98.3 F (36.8 C) 97.9 F (36.6 C) 98.3 F (36.8 C)  TempSrc: Oral Oral Oral Oral  SpO2: 94% 94% 95% 95%  Weight:      Height:       Body mass index is 27.06 kg/m.  Physical Exam Vitals reviewed.  HENT:     Mouth/Throat:     Mouth: No oral lesions.     Dentition: Normal dentition. No dental caries.  Eyes:     General: No scleral icterus. Cardiovascular:     Rate and Rhythm: Normal rate and regular rhythm.     Heart sounds: Normal heart sounds.  Pulmonary:      Effort: Pulmonary effort is normal.     Breath sounds: Normal breath sounds.  Abdominal:     General: There is no distension.     Palpations: Abdomen is soft.     Tenderness: There is no abdominal tenderness.  Lymphadenopathy:     Cervical: No cervical adenopathy.  Skin:    General: Skin is warm and dry.     Findings: No rash.  Neurological:     Mental Status: He is alert and oriented to person, place, and time.     Lab Results Lab Results  Component Value Date   WBC 18.0 (H) 09/30/2024   HGB 13.7 09/30/2024   HCT 39.1 09/30/2024   MCV 89.7 09/30/2024  PLT 252 09/30/2024    Lab Results  Component Value Date   CREATININE 0.50 (L) 09/30/2024   BUN 19 09/30/2024   NA 135 09/30/2024   K 3.1 (L) 09/30/2024   CL 99 09/30/2024   CO2 20 (L) 09/30/2024    Lab Results  Component Value Date   ALT 41 09/24/2024   AST 41 09/24/2024   ALKPHOS 89 09/24/2024   BILITOT 1.2 09/24/2024     Microbiology: Recent Results (from the past 240 hours)  Resp panel by RT-PCR (RSV, Flu A&B, Covid) Anterior Nasal Swab     Status: None   Collection Time: 09/22/24 12:55 PM   Specimen: Anterior Nasal Swab  Result Value Ref Range Status   SARS Coronavirus 2 by RT PCR NEGATIVE NEGATIVE Final    Comment: (NOTE) SARS-CoV-2 target nucleic acids are NOT DETECTED.  The SARS-CoV-2 RNA is generally detectable in upper respiratory specimens during the acute phase of infection. The lowest concentration of SARS-CoV-2 viral copies this assay can detect is 138 copies/mL. A negative result does not preclude SARS-Cov-2 infection and should not be used as the sole basis for treatment or other patient management decisions. A negative result may occur with  improper specimen collection/handling, submission of specimen other than nasopharyngeal swab, presence of viral mutation(s) within the areas targeted by this assay, and inadequate number of viral copies(<138 copies/mL). A negative result must be combined  with clinical observations, patient history, and epidemiological information. The expected result is Negative.  Fact Sheet for Patients:  bloggercourse.com  Fact Sheet for Healthcare Providers:  seriousbroker.it  This test is no t yet approved or cleared by the [place]  FDA and  has been authorized for detection and/or diagnosis of SARS-CoV-2 by FDA under an Emergency Use Authorization (EUA). This EUA will remain  in effect (meaning this test can be used) for the duration of the COVID-19 declaration under Section 564(b)(1) of the Act, 21 U.S.C.section 360bbb-3(b)(1), unless the authorization is terminated  or revoked sooner.       Influenza A by PCR NEGATIVE NEGATIVE Final   Influenza B by PCR NEGATIVE NEGATIVE Final    Comment: (NOTE) The Xpert Xpress SARS-CoV-2/FLU/RSV plus assay is intended as an aid in the diagnosis of influenza from Nasopharyngeal swab specimens and should not be used as a sole basis for treatment. Nasal washings and aspirates are unacceptable for Xpert Xpress SARS-CoV-2/FLU/RSV testing.  Fact Sheet for Patients: bloggercourse.com  Fact Sheet for Healthcare Providers: seriousbroker.it  This test is not yet approved or cleared by the [place]  FDA and has been authorized for detection and/or diagnosis of SARS-CoV-2 by FDA under an Emergency Use Authorization (EUA). This EUA will remain in effect (meaning this test can be used) for the duration of the COVID-19 declaration under Section 564(b)(1) of the Act, 21 U.S.C. section 360bbb-3(b)(1), unless the authorization is terminated or revoked.     Resp Syncytial Virus by PCR NEGATIVE NEGATIVE Final    Comment: (NOTE) Fact Sheet for Patients: bloggercourse.com  Fact Sheet for Healthcare Providers: seriousbroker.it  This test is not yet approved  or cleared by the [place]  FDA and has been authorized for detection and/or diagnosis of SARS-CoV-2 by FDA under an Emergency Use Authorization (EUA). This EUA will remain in effect (meaning this test can be used) for the duration of the COVID-19 declaration under Section 564(b)(1) of the Act, 21 U.S.C. section 360bbb-3(b)(1), unless the authorization is terminated or revoked.  Performed at [facility], [address] [place], [place] [address]   Resp panel by RT-PCR (RSV, Flu A&B, Covid) Anterior Nasal Swab     Status: None   Collection Time: 09/24/24 11:10 PM   Specimen: Anterior Nasal Swab  Result Value Ref Range Status   SARS Coronavirus 2 by RT PCR NEGATIVE NEGATIVE Final   Influenza A by PCR NEGATIVE NEGATIVE Final   Influenza B by PCR NEGATIVE NEGATIVE Final    Comment: (NOTE) The Xpert Xpress SARS-CoV-2/FLU/RSV plus assay is intended as an aid in the diagnosis of influenza from Nasopharyngeal swab specimens and should not be used as a sole basis for treatment. Nasal washings and aspirates are unacceptable for Xpert Xpress SARS-CoV-2/FLU/RSV testing.  Fact Sheet for Patients: bloggercourse.com  Fact Sheet for Healthcare Providers: seriousbroker.it  This test is not yet approved or cleared by the [place]  FDA and has been authorized for detection and/or diagnosis of SARS-CoV-2 by FDA under an Emergency Use Authorization (EUA). This EUA will remain in effect (meaning this test can be used) for the duration of the COVID-19 declaration under Section 564(b)(1) of the Act, 21 U.S.C. section 360bbb-3(b)(1), unless the authorization is terminated or revoked.     Resp Syncytial Virus by PCR NEGATIVE NEGATIVE Final    Comment: (NOTE) Fact Sheet for Patients: bloggercourse.com  Fact Sheet for Healthcare Providers: seriousbroker.it  This test is not yet  approved or cleared by the [place]  FDA and has been authorized for detection and/or diagnosis of SARS-CoV-2 by FDA under an Emergency Use Authorization (EUA). This EUA will remain in effect (meaning this test can be used) for the duration of the COVID-19 declaration under Section 564(b)(1) of the Act, 21 U.S.C. section 360bbb-3(b)(1), unless the authorization is terminated or revoked.  Performed at [facility], [address], [place], [place] [address]   Blood culture (routine x 2)     Status: Abnormal   Collection Time: 09/25/24  2:20 AM   Specimen: BLOOD  Result Value Ref Range Status   Specimen Description BLOOD SITE NOT SPECIFIED  Final   Special Requests   Final    BOTTLES DRAWN AEROBIC AND ANAEROBIC Blood Culture adequate volume   Culture  Setup Time   Final    GRAM POSITIVE COCCI IN BOTH AEROBIC AND ANAEROBIC BOTTLES CRITICAL RESULT CALLED TO, READ BACK BY AND VERIFIED WITH: PHARMD C. AMEND [phone] @ [phone] FH Performed at [facility], [address] [place], [place] [address]    Culture ENTEROCOCCUS FAECALIS (A)  Final   Report Status 09/27/2024 FINAL  Final   Organism ID, Bacteria ENTEROCOCCUS FAECALIS  Final      Susceptibility   Enterococcus faecalis - MIC*    AMPICILLIN  <=2 SENSITIVE Sensitive     VANCOMYCIN  1 SENSITIVE Sensitive     GENTAMICIN  SYNERGY SENSITIVE Sensitive     * ENTEROCOCCUS FAECALIS  Blood Culture ID Panel (Reflexed)     Status: Abnormal   Collection Time: 09/25/24  2:20 AM  Result Value Ref Range Status   Enterococcus faecalis DETECTED (A) NOT DETECTED Final    Comment: CRITICAL RESULT CALLED TO, READ BACK BY AND VERIFIED WITH: PHARMD C. AMEND [phone] @ 1638 FH    Enterococcus Faecium NOT DETECTED NOT DETECTED Final   Listeria monocytogenes NOT DETECTED NOT DETECTED Final   Staphylococcus species NOT DETECTED NOT DETECTED Final   Staphylococcus aureus (BCID) NOT DETECTED NOT DETECTED Final   Staphylococcus epidermidis NOT  DETECTED NOT DETECTED Final   Staphylococcus lugdunensis NOT DETECTED  NOT DETECTED Final   Streptococcus species NOT DETECTED NOT DETECTED Final   Streptococcus agalactiae NOT DETECTED NOT DETECTED Final   Streptococcus pneumoniae NOT DETECTED NOT DETECTED Final   Streptococcus pyogenes NOT DETECTED NOT DETECTED Final   A.calcoaceticus-baumannii NOT DETECTED NOT DETECTED Final   Bacteroides fragilis NOT DETECTED NOT DETECTED Final   Enterobacterales NOT DETECTED NOT DETECTED Final   Enterobacter cloacae complex NOT DETECTED NOT DETECTED Final   Escherichia coli NOT DETECTED NOT DETECTED Final   Klebsiella aerogenes NOT DETECTED NOT DETECTED Final   Klebsiella oxytoca NOT DETECTED NOT DETECTED Final   Klebsiella pneumoniae NOT DETECTED NOT DETECTED Final   Proteus species NOT DETECTED NOT DETECTED Final   Salmonella species NOT DETECTED NOT DETECTED Final   Serratia marcescens NOT DETECTED NOT DETECTED Final   Haemophilus influenzae NOT DETECTED NOT DETECTED Final   Neisseria meningitidis NOT DETECTED NOT DETECTED Final   Pseudomonas aeruginosa NOT DETECTED NOT DETECTED Final   Stenotrophomonas maltophilia NOT DETECTED NOT DETECTED Final   Candida albicans NOT DETECTED NOT DETECTED Final   Candida auris NOT DETECTED NOT DETECTED Final   Candida glabrata NOT DETECTED NOT DETECTED Final   Candida krusei NOT DETECTED NOT DETECTED Final   Candida parapsilosis NOT DETECTED NOT DETECTED Final   Candida tropicalis NOT DETECTED NOT DETECTED Final   Cryptococcus neoformans/gattii NOT DETECTED NOT DETECTED Final   Vancomycin  resistance NOT DETECTED NOT DETECTED Final    Comment: Performed at [facility], [address], [place], [place] [address]  Blood culture (routine x 2)     Status: None   Collection Time: 09/25/24  7:02 PM   Specimen: BLOOD LEFT HAND  Result Value Ref Range Status   Specimen Description BLOOD LEFT HAND  Final   Special Requests   Final    BOTTLES DRAWN AEROBIC  AND ANAEROBIC Blood Culture adequate volume   Culture   Final    NO GROWTH 5 DAYS Performed at [facility], [address], [place], [place] [address]    Report Status 09/30/2024 FINAL  Final  Culture, blood (Routine X 2) w Reflex to ID Panel     Status: None   Collection Time: 09/26/24 10:30 AM   Specimen: BLOOD RIGHT ARM  Result Value Ref Range Status   Specimen Description BLOOD RIGHT ARM  Final   Special Requests   Final    BOTTLES DRAWN AEROBIC AND ANAEROBIC Blood Culture adequate volume   Culture   Final    NO GROWTH 5 DAYS Performed at [facility], [address], [place], [place] [address]    Report Status 10/01/2024 FINAL  Final  Culture, blood (Routine X 2) w Reflex to ID Panel     Status: None   Collection Time: 09/26/24 10:41 AM   Specimen: BLOOD  Result Value Ref Range Status   Specimen Description BLOOD BLOOD RIGHT HAND  Final   Special Requests   Final    BOTTLES DRAWN AEROBIC AND ANAEROBIC Blood Culture adequate volume   Culture   Final    NO GROWTH 5 DAYS Performed at [facility], [address], [place], [place] [address]    Report Status 10/01/2024 FINAL  Final     Corean Fireman, MSN, NP-C [facility]  [email or web address] Pager: [phone] Office: [phone] [facility] Main Line: [phone] *Secure Chat Communication Welcome  I personally spent a total of 62 minutes in the care of the  patient today including preparing to see the patient, getting/reviewing separately obtained history, performing a medically appropriate exam/evaluation, counseling and educating, placing orders, referring and communicating with other health care professionals, documenting clinical information in the EHR, independently interpreting results, communicating results, coordinating care, and a separate 22 minute phone call with his niece Corean who holds HCPOA and I received permission to  discuss his care with. .       [1] No Known Allergies [2] No Known Allergies  "

## 2024-10-01 NOTE — Progress Notes (Signed)
 Occupational Therapy Treatment Patient Details Name: [name] [name] MRN: [id number] DOB: 04/06/1946 Today's Date: 10/01/2024   History of present illness 79 y.o. male presents 09/24/24 with c/o fever, generalized weakness, and brain fog since 09/07/24. Seen at [facility] ED found to have hypotension and AKI. Pt with c/o back pain, MRIs performed of the thoracic and lumbar spine without evidence of infection. Blood cultures now positive for Enterococcus faecalis. 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   OT comments  Pt without s/s of orthostatic hypotension this visit, able to progress from bed to chair with RW. Mod assist for supine to sit, mod assist to stand and min assist to transfer. Pt with loose bowel incontinence upon standing. Pt needs total assist for pericare, min assist to don front opening gown. Pt declined grooming seated in chair, reports he was waiting for someone to check his swallowing before brushing his teeth. Patient will benefit from continued inpatient follow up therapy, <3 hours/day.      If plan is discharge home, recommend the following:  A little help with walking and/or transfers;A lot of help with bathing/dressing/bathroom;Assistance with cooking/housework;Direct supervision/assist for medications management;Assist for transportation;Help with stairs or ramp for entrance   Equipment Recommendations  Other (comment) (defer)    Recommendations for Other Services      Precautions / Restrictions Precautions Precautions: Fall Recall of Precautions/Restrictions: Intact Precaution/Restrictions  Comments: watch BP Restrictions Weight Bearing Restrictions Per Provider Order: No       Mobility Bed Mobility Overal bed mobility: Needs Assistance Bed Mobility: Supine to Sit, Rolling Rolling: Modified independent (Device/Increase time), Used rails   Supine to sit: Mod assist     General bed mobility comments: rolled for pericare, gradually raised HOB and allowed time for pt to acclimate to position change prior to sitting EOB, assist to raise trunk and for hips to EOB with bed pad    Transfers Overall transfer level: Needs assistance Equipment used: Rolling walker (2 wheels) Transfers: Sit to/from Stand, Bed to chair/wheelchair/BSC Sit to Stand: Mod assist, +2 safety/equipment     Step pivot transfers: Min assist, +2 safety/equipment     General transfer comment: pt denied dizziness throughout session, negative for orthostatic hypotension     Balance Overall balance assessment: Needs assistance Sitting-balance support: Feet supported, No upper extremity supported Sitting balance-Leahy Scale: Fair     Standing balance support: Bilateral upper extremity supported Standing balance-Leahy Scale: Poor                             ADL either performed or assessed with clinical judgement   ADL Overall ADL's : Needs assistance/impaired                 Upper Body Dressing : Minimal assistance;Sitting Upper Body Dressing Details (indicate cue type and reason): front opening gown         Toileting- Clothing Manipulation and Hygiene: Total assistance;Bed level;Sit to/from stand Toileting - Clothing Manipulation Details (indicate cue type and reason): pt with loose bowel incontinence            Extremity/Trunk Assessment  Vision       Perception     Praxis     Communication Communication Communication: No apparent difficulties   Cognition Arousal: Alert Behavior During Therapy: WFL for tasks assessed/performed,  Anxious Cognition: Cognition impaired       Memory impairment (select all impairments): Short-term memory   Executive functioning impairment (select all impairments): Initiation, Sequencing                   Following commands: Impaired Following commands impaired: Follows one step commands with increased time      Cueing   Cueing Techniques: Verbal cues  Exercises      Shoulder Instructions       General Comments      Pertinent Vitals/ Pain       Pain Assessment Pain Assessment: Faces Faces Pain Scale: No hurt  Home Living                                          Prior Functioning/Environment              Frequency  Min 2X/week        Progress Toward Goals  OT Goals(current goals can now be found in the care plan section)  Progress towards OT goals: Progressing toward goals  Acute Rehab OT Goals OT Goal Formulation: With patient Time For Goal Achievement: 10/11/24 Potential to Achieve Goals: Good ADL Goals Pt Will Perform Lower Body Dressing: with min assist;with adaptive equipment  Plan      Co-evaluation                 AM-PAC OT 6 Clicks Daily Activity     Outcome Measure   Help from another person eating meals?: A Little Help from another person taking care of personal grooming?: A Little Help from another person toileting, which includes using toliet, bedpan, or urinal?: Total Help from another person bathing (including washing, rinsing, drying)?: A Lot Help from another person to put on and taking off regular upper body clothing?: A Little Help from another person to put on and taking off regular lower body clothing?: Total 6 Click Score: 13    End of Session Equipment Utilized During Treatment: Gait belt;Rolling walker (2 wheels)  OT Visit Diagnosis: Unsteadiness on feet (R26.81);Muscle weakness (generalized) (M62.81);Pain;Other symptoms and signs involving cognitive function   Activity Tolerance  Patient tolerated treatment well   Patient Left in chair;with call bell/phone within reach;with chair alarm set   Nurse Communication Mobility status;Other (comment) (aware pt is having loose stools)        Time: 9070-9042 OT Time Calculation (min): 28 min  Charges: OT General Charges $OT Visit: 1 Visit OT Treatments $Self Care/Home Management : 8-22 mins  Mliss HERO, OTR/L Acute Rehabilitation Services Office: [phone]   Kennth Mliss Helling 10/01/2024, 10:29 AM

## 2024-10-01 NOTE — Progress Notes (Signed)
 STROKE TEAM PROGRESS NOTE   INTERIM HISTORY/SUBJECTIVE  Seen in room, sitting up in the chair, no family at the bedside.  Speech is still dysarthric, no focal deficits noted.  CTA head neck with no evidence mycotic aneurysm.  OBJECTIVE  CBC    Component Value Date/Time   WBC 18.0 (H) 09/30/2024 0442   RBC 4.36 09/30/2024 0442   HGB 13.7 09/30/2024 0442   HGB 17.2 05/02/2021 1244   HCT 39.1 09/30/2024 0442   HCT 47.7 05/02/2021 1244   PLT 252 09/30/2024 0442   PLT 195 05/02/2021 1244   MCV 89.7 09/30/2024 0442   MCV 89 05/02/2021 1244   MCH 31.4 09/30/2024 0442   MCHC 35.0 09/30/2024 0442   RDW 14.2 09/30/2024 0442   RDW 12.7 05/02/2021 1244   LYMPHSABS 0.7 09/29/2024 0516   MONOABS 1.6 (H) 09/29/2024 0516   EOSABS 0.0 09/29/2024 0516   BASOSABS 0.1 09/29/2024 0516    BMET    Component Value Date/Time   NA 135 09/30/2024 0442   NA 141 05/02/2021 1244   K 3.1 (L) 09/30/2024 0442   CL 99 09/30/2024 0442   CO2 20 (L) 09/30/2024 0442   GLUCOSE 144 (H) 09/30/2024 0442   BUN 19 09/30/2024 0442   BUN 14 05/02/2021 1244   CREATININE 0.50 (L) 09/30/2024 0442   CREATININE 1.02 11/28/2016 0919   CALCIUM  7.8 (L) 09/30/2024 0442   EGFR 86 05/02/2021 1244   GFRNONAA >60 09/30/2024 0442    IMAGING past 24 hours CT ANGIO HEAD NECK W WO CM Result Date: 10/01/2024 EXAM: CTA Head and Neck with Intravenous Contrast. CT Head without Contrast. CLINICAL HISTORY: Stroke/TIA, determine embolic source TECHNIQUE: Axial CTA images of the head and neck performed with intravenous contrast. MIP reconstructed images were created and reviewed. Axial computed tomography images of the head/brain performed without intravenous contrast. Note: Per PQRS, the description of internal carotid artery percent stenosis, including 0 percent or normal exam, is based on [employer] American Symptomatic Carotid Endarterectomy Trial (NASCET) criteria. Dose reduction technique was used including one or more of the  following: automated exposure control, adjustment of mA and kV according to patient size, and/or iterative reconstruction. CONTRAST: With; COMPARISON: MRI Head 09/29/14 FINDINGS: CT HEAD: BRAIN: No acute intraparenchymal hemorrhage. No mass lesion. Known left frontal infarcts better characterized on recent MRI. Remote right basal ganglia/corona radiata infarct. No midline shift or extra-axial collection. VENTRICLES: No hydrocephalus. ORBITS: The orbits are unremarkable. SINUSES AND MASTOIDS: The paranasal sinuses and mastoid air cells are clear. CTA NECK: COMMON CAROTID ARTERIES: No significant stenosis. No dissection or occlusion. INTERNAL CAROTID ARTERIES: No stenosis by NASCET criteria. No dissection or occlusion. VERTEBRAL ARTERIES: Multifocal irregularity of the left V2 vertebral artery with occluded V3 segment. Right vertebral artery is patent. CTA HEAD: ANTERIOR CEREBRAL ARTERIES: Severe left ACA origin stenosis, nearly occlusive. No occlusion. No aneurysm. MIDDLE CEREBRAL ARTERIES: No significant stenosis. No occlusion. No aneurysm. POSTERIOR CEREBRAL ARTERIES: No significant stenosis. No occlusion. No aneurysm. BASILAR ARTERY: No significant stenosis. No occlusion. No aneurysm. SOFT TISSUES: No acute finding. Emphysema. BONES: No acute osseous abnormality. OTHER: Findings will be communicated to the clinical team by the radiology assistant and documented in PACs/Clario. IMPRESSION: 1. Multifocal irregularity of the left V2 vertebral artery with occluded V3 segment, which could be due to dissection or atherosclerosis. 2. Severe (nearly occlusive) left ACA origin stenosis with preserved distal opacification. Electronically signed by: Glendia Molt MD 10/01/2024 02:33 AM EST RP Workstation: [id number]    Vitals:  09/30/24 0736 09/30/24 1423 09/30/24 1944 10/01/24 0817  BP: (!) 146/82 (!) 156/74 (!) 140/70 (!) 169/77  Pulse: 91 89 98 90  Resp: 16 16 17 16   Temp: 98.5 F (36.9 C) 98.3 F (36.8 C) 97.9 F  (36.6 C) 98.3 F (36.8 C)  TempSrc: Oral Oral Oral Oral  SpO2: 94% 94% 95% 95%  Weight:      Height:         PHYSICAL EXAM General:  Alert, well-nourished, well-developed patient in no acute distress Psych:  Mood and affect appropriate for situation CV: Regular rate and rhythm on monitor Respiratory:  Regular, unlabored respirations on room air GI: Abdomen soft and nontender   NEURO:  Mental Status: AA&Ox3, patient is able to give clear and coherent history Speech/Language: speech is dysarthric.  Naming, repetition, fluency, and comprehension intact.  Cranial Nerves:  II: PERRL. Visual fields full.  III, IV, VI: EOMI. Eyelids elevate symmetrically.  V: Sensation is intact to light touch and symmetrical to face.  VII: Face is symmetrical resting and smiling VIII: hearing intact to voice. IX, X: Palate elevates symmetrically. Phonation is normal.  KP:Dynloizm shrug 5/5. XII: tongue is midline without fasciculations. Motor: 5/5 strength to all muscle groups tested.  Tone: is normal and bulk is normal Sensation- Intact to light touch bilaterally. Extinction absent to light touch to DSS.   Coordination: FTN intact bilaterally, HKS: no ataxia in BLE.No drift.  Gait- deferred  Most Recent NIH 1  ASSESSMENT/PLAN  Mr. [name] [name] is a 79 y.o. male with history of HTN, HLD, T2DM, CAD, paroxysmal AFib (on home Eliquis  until yesterday), and valvular heart disease s/p dual-chamber pacemaker placement in November 2011 who presented to the hospital with two weeks of fever, generalized weakness, and confusion. Blood cultures subsequently grew Enterococcus faecalis and TEE concerning for subacute bacterial endocarditis.    Acute Ischemic Infarct:  bilateral acute infarcts in the left frontal lobe and right parietal lobe  Etiology:  embolic in the setting of endocarditis and afib Code Stroke CT head No acute abnormality. CTA head & neck multifocal irregularity of the left V2 vertebral  artery with occluded V3, nearly occlusive stenosis of the left ACA with preserved distal flow MRI many acute left frontal and subcortical infarcts, single acute infarct in the right parietal lobe 2D Echo EF 60-65% TEE Large pleural effusion in the left lateral region Mobile mass in the aortic valve LDL 47 HgbA1c 6.5 VTE prophylaxis -full dose anticoagulation Eliquis  (apixaban ) daily prior to admission, can resume Eliquis  (apixaban ) daily  Therapy recommendations:  CIR Disposition: Pending completion of workup  Hx of Stroke/TIA Remote infarct noted in right corona radiata-no known neurological history  Atrial fibrillation S/p PPM Home Meds: Eliquis  Continue telemetry monitoring CTA with no mycotic aneurysm concern, may resume anticoagulation  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 bacteremia  ID following, recommend CTS consult Per CTS he is not a good candidate for surgery, they are recommending antibiotic therapy/medical management IV ampicillin  and ceftriaxone  with plan for a dual-lumen PICC  Hypertension Home meds: Telmisartan, metoprolol , Norvasc  Stable BP goal per cardiology  Hyperlipidemia Home meds: Atorvastatin  40 mg, resumed in hospital LDL 47, goal < 70 Continue statin at discharge  Diabetes type II Controlled Home meds: Metformin, Jardiance  HgbA1c 6.5, goal < 7.0 CBGs, SSI Recommend close follow-up with PCP for better DM control  Dysphagia Patient has post-stroke dysphagia, SLP consulted    Diet   DIET DYS 3 Room service appropriate? Yes; Fluid consistency: Thin  Advance diet as tolerated  Other Stroke Risk Factors ETOH use-- advised to drink no more than 2 drink(s) a day Obesity, Body mass index is 27.06 kg/m., BMI >/= 30 associated with increased stroke risk, recommend weight loss, diet and exercise as appropriate  Coronary artery disease  Other Active Problems Hypokalemia  Replace per primary team Chronic back  pain Imaging shows multilevel DJD and stenosis with no infectious process  Hospital day # 6  Outpatient neurology follow-up 8 to 12 weeks-[facility], stroke MD/stroke NP  Patient seen and examined by NP/APP with MD. MD to update note as needed.   Jorene Last, DNP, FNP-BC [facility] Pager: [phone]  Attending Neurohospitalist Addendum Patient seen and examined with APP/Resident. Agree with the history and physical as documented above. Agree with the plan as documented, which I helped formulate. I have independently reviewed the chart, obtained history, review of systems and examined the patient.I have personally reviewed pertinent head/neck/spine imaging (CT/MRI). Plan relayed to Dr. Sonjia.  Please call with questions as needed.  We will be available as needed -- Eligio Lav, MD Neurologist [facility] Pager: [phone]

## 2024-10-01 NOTE — Evaluation (Signed)
 Speech Language Pathology Evaluation Patient Details Name: [name] [name] MRN: [id number] DOB: 1946/08/16 Today's Date: 10/01/2024 Time: 1206-1228 SLP Time Calculation (min) (ACUTE ONLY): 22 min  Problem List:  Patient Active Problem List   Diagnosis Date Noted   Prosthetic valve endocarditis 09/29/2024   Pacemaker infection 09/29/2024   Enterococcus faecalis infection 09/26/2024   Fever of unknown origin 09/25/2024   History of complete AV block 09/25/2024   Aortic stenosis s/p aortic valve placement 09/25/2024   History of CAD (coronary artery disease) 09/25/2024   Non-insulin  dependent type 2 diabetes mellitus (HCC) 09/25/2024   Hypokalemia 09/25/2024   Malignant neoplasm of prostate (HCC) 10/25/2021   Pacemaker battery depletion 05/23/2021   Dyslipidemia 12/27/2020   History of colon polyps 07/04/2018   Constipation 07/04/2018   Hypertensive retinopathy of both eyes 12/25/2017   Arcus senilis, bilateral 12/25/2017   Nuclear sclerotic cataract of both eyes 12/21/2016   Acute bilateral low back pain without sciatica 10/25/2016   S/P bioprosthetic AVR - 21 mm [facility], Dr. Fleeta Trigt 09/2010 06/18/2013   Second degree AV block, Mobitz type I  06/18/2013   Status post placement of cardiac pacemaker 06/18/2013   Type 2 diabetes mellitus without complication, without long-term current use of insulin  (HCC) 06/18/2013   Mixed hyperlipidemia 06/18/2013   Essential hypertension 06/18/2013   Paroxysmal atrial fibrillation (HCC) 06/18/2013   Past Medical History:  Past Medical History:  Diagnosis Date   Anticoagulant long-term use    eliquis --- managed by cardiology   Aortic valve insufficiency    cardiologist--- dr francyne;   01/ 2012  s/p AVR for severe AS , bicusipd;  last echo 12-25-2019  mild AR no stenosis, mean grandiant   BPH associated with nocturia    Coronary artery disease    cardiologist--- dr croitoru;   nuclear stress test 02-28-2007  normal perfusion  no ischemia, nuclear ef 58%;   cardiac cath 06-30-2010  severe AS and nonobstructive disease involving LAD/ CFx/ RCA   Heart murmur    History of non-ST elevation myocardial infarction (NSTEMI) 06/2010   History of ventricular tachycardia 06/2010   nonsustained VT in setting NSTEMI; severe AS   Hypertension    Malignant neoplasm prostate (HCC) 07/2021   urologist--- dr gay:  dx 11/ 2022 Gleason 4+3, PSA 11.9   Mitral insufficiency    per echo 12-25-2019  mile regurg,  no stenosis, degenerative   Mixed dyslipidemia    Mobitz type 1 second degree AV block 06/2010   symptomatic syncope;  s/p PPM   Nonproliferative retinopathy of both eyes due to type 2 diabetes mellitus (HCC)    mild without edema   PAF (paroxysmal atrial fibrillation) (HCC)    followed by cardiology---- post op afib 01/ 2012   Presence of permanent cardiac pacemaker 07/21/2010   medtronic;  PPM dual chamber;  generator change 05-23-2021  medtronic azure xt device   RBBB (right bundle branch block with left anterior fascicular block)    Tinnitus of both ears    Type 2 diabetes mellitus [facility])    endocrinologist-- dr d. patel   (01-13-2022  pt stated only checks sugar occasionally   Wears glasses    Past Surgical History:  Past Surgical History:  Procedure Laterality Date   AORTIC VALVE REPLACEMENT  10/06/2010   @[facility]   by dr emeterio Celestia Staple Ease prosthesis   CARDIAC CATHETERIZATION  06/30/2010   @[facility]   ;   nonobstructive CAD, heavily ca+ AOV   GOLD SEED  IMPLANT N/A 01/17/2022   Procedure: GOLD SEED IMPLANT;  Surgeon: Selma Donnice SAUNDERS, MD;  Location: [facility];  Service: Urology;  Laterality: N/A;  30 MINS   LUMBAR DISC SURGERY     yrs ago   PERMANENT PACEMAKER INSERTION  07/21/2010   @[facility]  by dr croitoru;  Medtronic   PPM GENERATOR CHANGEOUT N/A 05/23/2021   Procedure: PPM GENERATOR CHANGEOUT;  Surgeon: Francyne Headland, MD;  Location: [facility] INVASIVE CV LAB;  Service: Cardiovascular;  Laterality:  N/A;   SPACE OAR INSTILLATION N/A 01/17/2022   Procedure: SPACE OAR INSTILLATION;  Surgeon: Selma Donnice SAUNDERS, MD;  Location: [facility];  Service: Urology;  Laterality: N/A;   TRANSESOPHAGEAL ECHOCARDIOGRAM (CATH LAB) N/A 09/29/2024   Procedure: TRANSESOPHAGEAL ECHOCARDIOGRAM;  Surgeon: Ren Donley Joelle VEAR, MD;  Location: [facility] INVASIVE CV LAB;  Service: Cardiovascular;  Laterality: N/A;   HPI:  79 y.o. male presents 09/24/24 with c/o fever, generalized weakness, and brain fog since 09/07/24. Seen at [facility] ED found to have hypotension and AKI. Pt with c/o back pain, MRIs performed of the thoracic and lumbar spine without evidence of infection. Blood cultures now positive for Enterococcus faecalis. 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 PMH: valvular heart disease and AV block s/p post dual-chamber pacemaker, paroxysmal atrial fibrillation, aortic stenosis s/p aortic valve placement, HFpEF, history of prostate cancer, hyperlipidemia, CAD and non-insulin -dependent DM type II   Assessment / Plan / Recommendation Clinical Impression  Patient presents with a mild-moderate dysarthria without obvious focal oral deficits. Patient reports xerostomia which could be impacting function although he also reports that onset was relatively sudden prior to admission which points to neuro component. Cognitively, patient with decreased sustained attention which impacts working memory, decreased thought organization and decreased reasoning/problem solving for more complex tasks which have the potential to impact safety and efficiency of independent living. Recommend short term SNF for ongoing therapy. SLP will f/u acutely for faciliation of improved cognitive linguistic function.    SLP Assessment  SLP Recommendation/Assessment: Patient needs continued Speech Language  Pathology Services SLP Visit Diagnosis: Dysarthria and anarthria (R47.1);Attention and concentration deficit Attention and concentration deficit following: Cerebral infarction     Assistance Recommended at Discharge  Frequent or constant Supervision/Assistance  Functional Status Assessment Patient has had a recent decline in their functional status and demonstrates the ability to make significant improvements in function in a reasonable and predictable amount of time.  Frequency and Duration min 2x/week  2 weeks      SLP Evaluation Cognition  Overall Cognitive Status: No family/caregiver present to determine baseline cognitive functioning Arousal/Alertness: Awake/alert Orientation Level: Oriented to person;Oriented to place;Disoriented to time;Oriented to situation Attention: Sustained Sustained Attention: Impaired Sustained Attention Impairment: Verbal complex Memory: Impaired Memory Impairment: Storage deficit;Retrieval deficit Awareness: Appears intact Problem Solving: Impaired Problem Solving Impairment: Verbal complex Safety/Judgment: Impaired Comments: complex       Comprehension  Auditory Comprehension Overall Auditory Comprehension: Impaired Yes/No Questions: Within Functional Limits Commands: Impaired Multistep Basic Commands: 75-100% accurate Interfering Components: Attention EffectiveTechniques: Repetition;Visual/Gestural cues Visual Recognition/Discrimination Discrimination: Within Function Limits Reading Comprehension Reading Status: Unable to assess (comment) (unable to assess fully due to visual deficits)    Expression Expression Primary Mode of Expression: Verbal Verbal Expression Overall Verbal Expression: Appears within functional limits for tasks assessed   Oral / Motor  Oral Motor/Sensory Function Overall Oral Motor/Sensory  Function: Mild impairment Facial ROM: Within Functional Limits (possible mild left sided assymetry, mostly eye although  adequate ROM) Facial Symmetry: Abnormal symmetry left Facial Strength: Within Functional Limits Facial Sensation: Within Functional Limits Lingual ROM: Within Functional Limits Lingual Symmetry: Abnormal symmetry right Lingual Strength: Within Functional Limits Lingual Sensation: Within Functional Limits Velum: Within Functional Limits Mandible: Within Functional Limits Motor Speech Overall Motor Speech: Impaired Respiration: Within functional limits Phonation: Normal Resonance: Within functional limits Articulation: Impaired Level of Impairment: Word Intelligibility: Intelligibility reduced Word: 75-100% accurate Phrase: 75-100% accurate Sentence: 50-74% accurate Conversation: 50-74% accurate Motor Planning: Within functional limits Interfering Components:  (dry mouth) Effective Techniques: Increased vocal intensity;Over-articulate           Esequiel Kleinfelter MA, CCC-SLP  [name] [name] 10/01/2024, 12:45 PM

## 2024-10-01 NOTE — Progress Notes (Signed)
 Peripherally Inserted Central Catheter Placement  The IV Nurse has discussed with the patient and/or persons authorized to consent for the patient, the purpose of this procedure and the potential benefits and risks involved with this procedure.  The benefits include less needle sticks, lab draws from the catheter, and the patient may be discharged home with the catheter. Risks include, but not limited to, infection, bleeding, blood clot (thrombus formation), and puncture of an artery; nerve damage and irregular heartbeat and possibility to perform a PICC exchange if needed/ordered by physician.  Alternatives to this procedure were also discussed.  Bard Power PICC patient education guide, fact sheet on infection prevention and patient information card has been provided to patient /or left at bedside.    PICC Placement Documentation  PICC Double Lumen 10/01/24 Right Cephalic 39 cm 1 cm (Active)  Indication for Insertion or Continuance of Line Home intravenous therapies 10/01/24 1810  Exposed Catheter (cm) 1 cm 10/01/24 1810  Site Assessment Clean, Dry, Intact 10/01/24 1810  Lumen #1 Status Flushed;Blood return noted;Saline locked 10/01/24 1810  Lumen #2 Status Flushed;Blood return noted;Saline locked 10/01/24 1810  Dressing Type Transparent 10/01/24 1810  Dressing Status Antimicrobial disc/dressing in place 10/01/24 1810  Line Care Connections checked and tightened 10/01/24 1810  Line Adjustment (NICU/IV Team Only) No 10/01/24 1810  Dressing Intervention New dressing 10/01/24 1810  Dressing Change Due 10/08/24 10/01/24 1810   Brother [facility] gave consent for PICC placement.    Aron Singh Ramos 10/01/2024, 6:12 PM

## 2024-10-01 NOTE — Progress Notes (Signed)
 "  Rounding Note   Patient Name: [name] [name] Date of Encounter: 10/01/2024  [facility] Cardiologist: Jerel Balding, MD   Subjective  Denies any CP, SOB, doing OK I reckon! On the phone with his brother  Scheduled Meds:  amLODipine   5 mg Oral Daily   apixaban   5 mg Oral BID   atorvastatin   40 mg Oral Daily   empagliflozin   10 mg Oral Daily   feeding supplement  237 mL Oral BID BM   Gerhardt's butt cream  1 Application Topical BID   insulin  aspart  0-6 Units Subcutaneous TID WC   loperamide   2 mg Oral Once   metoprolol  tartrate  50 mg Oral BID   pantoprazole   40 mg Oral Daily   sodium chloride  flush  3 mL Intravenous Q12H   sodium chloride  flush  3 mL Intravenous Q12H   Continuous Infusions:  ampicillin  (OMNIPEN) IV 2 g (10/01/24 1626)   cefTRIAXone  (ROCEPHIN )  IV 2 g (10/01/24 1014)   PRN Meds: alum & mag hydroxide-simeth, oxyCODONE , sodium chloride  flush, trimethobenzamide    Vital Signs  Vitals:   09/30/24 1944 10/01/24 0817 10/01/24 1200 10/01/24 1700  BP: (!) 140/70 (!) 169/77 132/74 139/67  Pulse: 98 90 84 87  Resp: 17 16 18 16   Temp: 97.9 F (36.6 C) 98.3 F (36.8 C) 98.5 F (36.9 C)   TempSrc: Oral Oral Oral   SpO2: 95% 95% 95% 96%  Weight:      Height:        Intake/Output Summary (Last 24 hours) at 10/01/2024 1756 Last data filed at 10/01/2024 0346 Gross per 24 hour  Intake 120 ml  Output 1300 ml  Net -1180 ml      09/29/2024    8:13 AM 09/22/2024   10:34 AM 05/15/2024    8:26 AM  Last 3 Weights  Weight (lbs) 194 lb 0.1 oz 194 lb 206 lb 11.2 oz  Weight (kg) 88 kg 87.998 kg 93.759 kg      Telemetry  SR/V paced - Personally Reviewed  ECG   No new EKGs - Personally Reviewed  Physical Exam  GEN: No acute distress.   Neck: No JVD Cardiac: RRR, no murmurs, rubs, or gallops.  Respiratory: Clear to auscultation bilaterally. GI: Soft, nontender, non-distended  MS: No edema; No deformity. Neuro:  slow speech, no other gross  focal deficits noted Psych: Normal affect   Labs High Sensitivity Troponin:  No results for input(s): TROPONINIHS in the last 720 hours. No results for input(s): TRNPT in the last 720 hours.     Chemistry Recent Labs  Lab 09/24/24 1959 09/24/24 2003 09/25/24 0220 09/25/24 9167 09/29/24 0516 09/30/24 0442 10/01/24 0528  NA 132*   < > 133*   < > 133* 135 137  K 3.2*   < > 3.4*   < > 3.0* 3.1* 3.3*  CL 95*   < > 97*   < > 97* 99 100  CO2 23  --  22   < > 20* 20* 21*  GLUCOSE 128*   < > 97   < > 107* 144* 95  BUN 17   < > 14   < > 12 19 18   CREATININE 0.76   < > 0.61   < > 0.50* 0.50* 0.51*  CALCIUM  8.6*  --  8.0*   < > 7.9* 7.8* 7.8*  MG  --   --  2.1  --   --   --   --  PROT 6.2*  --   --   --   --   --   --   ALBUMIN 3.4*  --   --   --   --   --   --   AST 41  --   --   --   --   --   --   ALT 41  --   --   --   --   --   --   ALKPHOS 89  --   --   --   --   --   --   BILITOT 1.2  --   --   --   --   --   --   GFRNONAA >60  --  >60   < > >60 >60 >60  ANIONGAP 14  --  15   < > 16* 17* 17*   < > = values in this interval not displayed.    Lipids  Recent Labs  Lab 10/01/24 0528  CHOL 87  TRIG 122  HDL 16*  LDLCALC 47  CHOLHDL 5.4    Hematology Recent Labs  Lab 09/28/24 0439 09/29/24 0516 09/30/24 0442  WBC 13.4* 16.3* 18.0*  RBC 4.44 4.47 4.36  HGB 13.7 13.9 13.7  HCT 40.5 40.1 39.1  MCV 91.2 89.7 89.7  MCH 30.9 31.1 31.4  MCHC 33.8 34.7 35.0  RDW 14.0 13.8 14.2  PLT 194 223 252   Thyroid No results for input(s): TSH, FREET4 in the last 168 hours.  BNPNo results for input(s): BNP, PROBNP in the last 168 hours.  DDimer No results for input(s): DDIMER in the last 168 hours.   Radiology  US  EKG SITE RITE Result Date: 10/01/2024 If Site Rite image not attached, placement could not be confirmed due to current cardiac rhythm.  CT ANGIO HEAD NECK W WO CM Result Date: 10/01/2024 EXAM: CTA Head and Neck with Intravenous Contrast. CT Head without  Contrast. CLINICAL HISTORY: Stroke/TIA, determine embolic source TECHNIQUE: Axial CTA images of the head and neck performed with intravenous contrast. MIP reconstructed images were created and reviewed. Axial computed tomography images of the head/brain performed without intravenous contrast. Note: Per PQRS, the description of internal carotid artery percent stenosis, including 0 percent or normal exam, is based on [employer] Symptomatic Carotid Endarterectomy Trial (NASCET) criteria. Dose reduction technique was used including one or more of the following: automated exposure control, adjustment of mA and kV according to patient size, and/or iterative reconstruction. CONTRAST: With; COMPARISON: MRI Head 09/29/14 FINDINGS: CT HEAD: BRAIN: No acute intraparenchymal hemorrhage. No mass lesion. Known left frontal infarcts better characterized on recent MRI. Remote right basal ganglia/corona radiata infarct. No midline shift or extra-axial collection. VENTRICLES: No hydrocephalus. ORBITS: The orbits are unremarkable. SINUSES AND MASTOIDS: The paranasal sinuses and mastoid air cells are clear. CTA NECK: COMMON CAROTID ARTERIES: No significant stenosis. No dissection or occlusion. INTERNAL CAROTID ARTERIES: No stenosis by NASCET criteria. No dissection or occlusion. VERTEBRAL ARTERIES: Multifocal irregularity of the left V2 vertebral artery with occluded V3 segment. Right vertebral artery is patent. CTA HEAD: ANTERIOR CEREBRAL ARTERIES: Severe left ACA origin stenosis, nearly occlusive. No occlusion. No aneurysm. MIDDLE CEREBRAL ARTERIES: No significant stenosis. No occlusion. No aneurysm. POSTERIOR CEREBRAL ARTERIES: No significant stenosis. No occlusion. No aneurysm. BASILAR ARTERY: No significant stenosis. No occlusion. No aneurysm. SOFT TISSUES: No acute finding. Emphysema. BONES: No acute osseous abnormality. OTHER: Findings will be communicated to the clinical team by the radiology assistant  and documented in  PACs/Clario. IMPRESSION: 1. Multifocal irregularity of the left V2 vertebral artery with occluded V3 segment, which could be due to dissection or atherosclerosis. 2. Severe (nearly occlusive) left ACA origin stenosis with preserved distal opacification. Electronically signed by: Glendia Molt MD 10/01/2024 02:33 AM EST RP Workstation: [id number]    Cardiac Studies   09/29/24: TEE  1. No left atrial/left atrial appendage thrombus was detected. The LAA  emptying velocity was 92 cm/s.   2. Right atrial size was Lead in RA.   3. Large pleural effusion in the left lateral region.   4. The mitral valve is degenerative. Mild mitral valve regurgitation.  Moderate mitral stenosis. The mean mitral valve gradient is 8.0 mmHg.  Moderate mitral annular calcification.   5. 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 The aortic valve  has been repaired/replaced. Aortic valve  regurgitation is not visualized. There is a Magna bioprosthetic valve  present in the aortic position. Procedure Date: 09/2010.   6. Aortic dilatation noted. There is dilatation of the ascending aorta,  measuring 43 mm.   7. 3D performed of the aortic valve.   Comparison(s): Compared to prior TTE, there is evidence of prosthetic  valve stenosis with PV increasing from 2 to 3.4 m/sec, and MG increasing  from 10 to 26.   Conclusion(s)/Recommendation(s): Findings are concerning for  vegetation/infective endocarditis as detailed above.   09/25/24: TTE 1. Left ventricular ejection fraction, by estimation, is 60 to 65%. The  left ventricle has normal function. The left ventricle has no regional  wall motion abnormalities. Left ventricular diastolic parameters are  indeterminate. Elevated left atrial  pressure.   2. Right ventricular systolic function is normal. The right ventricular   size is normal.   3. The mitral valve is normal in structure. Trivial mitral valve  regurgitation. No evidence of mitral stenosis. Severe mitral annular  calcification.   4. The aortic valve has been repaired/replaced. Aortic valve  regurgitation is mild. No aortic stenosis is present. There is a 21 mm  Magna Ease bioprosthetic valve present in the aortic position. Procedure  Date: 10/06/10.   5. The inferior vena cava is dilated in size with >50% respiratory  variability, suggesting right atrial pressure of 8 mmHg.  Patient Profile   79 y.o. male w/PMHx of  HTN, HLD, DM Advanced AV block w/PPM AVR (severe AS, bioprosthetic 2012) AFib  Admitted with a couple week of generalized malaise, weakness, brain fog, did seem to get better then developed fever at home and progressive back pain came in (Had arecent ER visit with what was described as orthostatic hypotension and AKI, responed to IVF).   ID consulted 1/16 with BC that came Enterococcus faecalis EP brought on board given his device CTS consulted for AVR endocarditis Neuro consulted for new strokes  Device information MDT dual chamber PPM He has 2 5086 leads, both implanted on 07/21/2010 Generator is an Azure from 05/23/21  Assessment & Plan   PPM Not dependent by his december remote Appears 100% V pacing here Worrisome for perivalvular abscess given new hi V pacing burden CTS  has seen him and not felt a candidate for such an extensive surgery, redo AVR, possible root, with multiple embolic strokes, significant MV disease and ascending aortic aneurysm replacement. Given this, PPM extraction would  not be of benefit  In d/w Dr. Almetta, life long suppressive antibiotics vs second opinion by cardiothoracic surgery at a tertiary center  Dr. Almetta had been bedside this morning No family at bedside this evening, but spoke to his brother Gerlene on the phone. Updated both the patient/brother, they were both aware.      Bacteremia enterococcus faecalis  ID on board TEE with findings of AVR endocarditis S/p picc  Abx as per ID   Paroxysmal AFib CHA2DS2 is 4, on Eliquis  > currently on hold OK to resume OAC per neurology note OK to resume from our perspective, no plans fro procedures from EP service  He remains in SR  4. 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

## 2024-10-01 NOTE — Progress Notes (Signed)
 " PROGRESS NOTE  [name] [name] FMW:[id number] DOB: 1946/08/05 DOA: 09/24/2024 PCP: [name], MD   LOS: 6 days   Brief narrative:  The patient is a 79 yr old male with BPH, hypertension, hyperlipidemia, diabetes mellitus type 2, CAD, paroxysmal atrial fibrillation, valvular heart disease status post AV block status post dual-chamber pacemaker in November 2011 and generator change in 2022, diabetes mellitus presented to the hospital for fever of 2 weeks with generalized weakness and brain fog.  In the ED patient was hemodynamically stable.  Initial WBC was 13 with sodium of 132.  Lipase elevated at 148 but lactate was normal.  Patient was then started on Zosyn  and vancomycin .  ID was consulted and 2D echocardiogram, MRI of the T and L spine was performed.  MRI did not show evidence of lumbar spine infection but diffuse disc and facet degeneration without high-grade spinal stenosis, there is also moderate to severe right and moderate left neural foraminal stenosis at L5-S1. There is also mild to moderal lateral recess stenosis at L3-L4.  Blood cultures showed Enterococcus faecalis. Infectious disease has been consulted.  Underwent TEE 09/29/2024 and was noted to have thickening of the valves concerning for endocarditis and electrophysiology cardiology and CT surgery were consulted.  MRI of the brain showed some stroke so neurology was also consulted.  At this time   patient will need at least 6 weeks of IV antibiotics.  Currently, receiving high dose ampicillin , ceftriaxone , further decision to be ascertained.     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 Fleeta Trigt 09/2010   Status post placement of cardiac pacemaker   Type 2 diabetes mellitus without complication, without long-term current use of insulin  (HCC)   Essential hypertension   Paroxysmal atrial fibrillation  (HCC)   History of complete AV block   Aortic stenosis s/p aortic valve placement   History of CAD (coronary artery disease)   Non-insulin  dependent type 2 diabetes mellitus (HCC)   Hypokalemia   Prosthetic valve endocarditis   Pacemaker infection   Fever of unknown origin -  Enterococcus faecalis Bacteremia with septic emboli to the brain CBC showed leukocytosis.  Unable to identify source of infection yet.  MRI of the lumbar spine and MRI of the thoracic spine has ruled out discitis/osteomyelitis.  2D echocardiogram showed bacteremia.. No vegetations were seen on TTE.  TEE was performed on 09/29/2024 with thickening suggestive of prosthetic aortic valve endocarditis.  Patient also has pacemaker in place.  Infectious disease on board and at this time cardiothoracic surgery and electrophysiology cardiology have been consulted.  Electrophysiology cardiology with an impression that device extraction would not be beneficial.  Cardiothoracic surgery with an impression that patient is not a candidate for redo AVR or possible root replacement with multiple embolic strokes significant MV disease and ascending aortic aneurysm.  Patient is currently on ampicillin  and ceftriaxone .  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  TEE without any thrombus.  ID has been consulted.  Will follow recommendations on antibiotic.  PICC line has been ordered by infectious disease.  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  Had brain fog initially with a distant history of slurred speech.  Incidentally noted.  No focal deficits at this time.  Neurology was consulted and ordered CTA of the head and neck.  Patient was on Eliquis  until 09/28/2024 on hold.  Neurology to review.  Neurology  recommended speech evaluation.  On dysphagia 3 diet at this time.   Chronic back pain with sciatica  MRI lumbar and thoracic spine have ruled out infectious process in the spine. 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  Continue  supportive care.   Hypokalemia Was replenished yesterday with 40 mEq for potassium of 3.1.SABRA  Check a BMP today.  Will give additional 40 mill equivalents today.   History of aortic stenosis s/p bioprosthesis aortic valve placement No vegetations seen on TTE.  Status post TEE  09/29/2024 there is concern for infective endocarditis.  Electrophysiology cardiology surgery does not recommend pacer extraction.   History of paroxysmal atrial fibrillation History of AV block status post pacemaker Multiple embolic strokes EKG showed PVCs and A-fib.  Was on Eliquis  and metoprolol .  Currently on hold.  Neurology was consulted and currently anticoagulation on hold.  Chronic diastolic heart failure History of essential hypertension On metoprolol  50mg  twice daily.  Resume amlodipine  from today.   History of CAD No acute disease.  Continue statin and Lopressor .  Eliquis  on hold at this time.   Insulin -dependent DM type II Continue Jardiance , sliding scale insulin .  Closely monitor.  Last POC glucose of 90   DVT prophylaxis: Place and maintain sequential compression device Start: 09/29/24 1244 SCDs Start: 09/25/24 0227 Place TED hose Start: 09/25/24 0227   Disposition: Skilled nursing facility as per PT evaluation  Status is: Inpatient Remains inpatient appropriate because: Pending clinical improvement, need for rehabitation, on IV antibiotics, electrophysiology and CT surgery consultation    Code Status:     Code Status: Full Code  Family Communication: Spoke with  patient niece who is also the health care power of attorney on 09/30/2024.  Consultants: Cardiology Infectious disease Electrophysiology cardiology CT surgery Neurology opinion  Procedures: TEE on 09/29/2024  Anti-infectives:  Ampicillin  and Rocephin  IV  Anti-infectives (From admission, onward)    Start     Dose/Rate Route Frequency Ordered Stop   09/26/24 0400  ampicillin  (OMNIPEN) 2 g in sodium chloride  0.9 % 100 mL  IVPB        2 g 300 mL/hr over 20 Minutes Intravenous Every 4 hours 09/25/24 2359     09/26/24 0400  cefTRIAXone  (ROCEPHIN ) 2 g in sodium chloride  0.9 % 100 mL IVPB        2 g 200 mL/hr over 30 Minutes Intravenous Every 12 hours 09/25/24 2359     09/25/24 1600  vancomycin  (VANCOCIN ) IVPB 1000 mg/200 mL premix  Status:  Discontinued        1,000 mg 200 mL/hr over 60 Minutes Intravenous Every 12 hours 09/25/24 0311 09/26/24 0000   09/25/24 0800  piperacillin -tazobactam (ZOSYN ) IVPB 3.375 g  Status:  Discontinued        3.375 g 12.5 mL/hr over 240 Minutes Intravenous Every 8 hours 09/25/24 0311 09/26/24 0000   09/25/24 0215  piperacillin -tazobactam (ZOSYN ) IVPB 3.375 g        3.375 g 100 mL/hr over 30 Minutes Intravenous  Once 09/25/24 0204 09/25/24 0337   09/25/24 0215  vancomycin  (VANCOREADY) IVPB 2000 mg/400 mL        2,000 mg 200 mL/hr over 120 Minutes Intravenous  Once 09/25/24 0204 09/25/24 0700       Subjective: Today, patient was seen and examined at bedside.  States that he feels okay.  Denies any nausea vomiting fever chills or rigor.  Feels thirsty and wants to eat.  States that his diarrhea has slowed down.  Passed bedside  swallow as per the nursing staff.  Objective: Vitals:   09/30/24 1944 10/01/24 0817  BP: (!) 140/70 (!) 169/77  Pulse: 98 90  Resp: 17 16  Temp: 97.9 F (36.6 C) 98.3 F (36.8 C)  SpO2: 95% 95%    Intake/Output Summary (Last 24 hours) at 10/01/2024 1109 Last data filed at 10/01/2024 0346 Gross per 24 hour  Intake 240 ml  Output 1300 ml  Net -1060 ml   Filed Weights   09/29/24 0813  Weight: 88 kg   Body mass index is 27.06 kg/m.   Physical Exam:  GENERAL: Patient is alert awake and oriented. Not in obvious distress.  Elderly male, HENT: No scleral pallor or icterus. Pupils equally reactive to light. Oral mucosa is moist NECK: is supple, no gross swelling noted. CHEST: Clear to auscultation. No crackles or wheezes.  Chest wall pacer in  place. CVS: S1 and S2 heard, no murmur. Regular rate and rhythm.  ABDOMEN: Soft, non-tender, bowel sounds are present.  External urinary catheter in place. EXTREMITIES: No edema.  CNS: Cranial nerves are intact. No focal motor deficits. SKIN: warm and dry without rashes.  Data Review: I have personally reviewed the following laboratory data and studies,  CBC: Recent Labs  Lab 09/24/24 1959 09/24/24 2003 09/26/24 0456 09/27/24 0428 09/28/24 0439 09/29/24 0516 09/30/24 0442  WBC 13.0*   < > 12.0* 13.6* 13.4* 16.3* 18.0*  NEUTROABS 10.6*  --  9.6* 10.7* 10.4* 13.7*  --   HGB 14.0   < > 12.5* 12.6* 13.7 13.9 13.7  HCT 40.8   < > 35.4* 36.6* 40.5 40.1 39.1  MCV 90.3   < > 88.5 89.7 91.2 89.7 89.7  PLT 210   < > 178 203 194 223 252   < > = values in this interval not displayed.   Basic Metabolic Panel: Recent Labs  Lab 09/25/24 0220 09/25/24 9167 09/26/24 0456 09/27/24 0428 09/28/24 0439 09/29/24 0516 09/30/24 0442  NA 133*   < > 133* 132* 135 133* 135  K 3.4*   < > 3.1* 4.4 3.9 3.0* 3.1*  CL 97*   < > 98 98 99 97* 99  CO2 22   < > 22 22 21* 20* 20*  GLUCOSE 97   < > 91 83 86 107* 144*  BUN 14   < > 13 13 13 12 19   CREATININE 0.61   < > 0.63 0.67 0.55* 0.50* 0.50*  CALCIUM  8.0*   < > 7.9* 8.0* 8.1* 7.9* 7.8*  MG 2.1  --   --   --   --   --   --   PHOS  --   --   --   --   --   --  3.4   < > = values in this interval not displayed.   Liver Function Tests: Recent Labs  Lab 09/24/24 1959  AST 41  ALT 41  ALKPHOS 89  BILITOT 1.2  PROT 6.2*  ALBUMIN 3.4*   Recent Labs  Lab 09/24/24 1959  LIPASE 148*   No results for input(s): AMMONIA in the last 168 hours. Cardiac Enzymes: No results for input(s): CKTOTAL, CKMB, CKMBINDEX, TROPONINI in the last 168 hours. BNP (last 3 results) No results for input(s): BNP in the last 8760 hours.  ProBNP (last 3 results) No results for input(s): PROBNP in the last 8760 hours.  CBG: Recent Labs  Lab  09/30/24 0627 09/30/24 1132 09/30/24 1616 09/30/24 2121 10/01/24 0626  GLUCAP  152* 122* 118* 102* 99   Recent Results (from the past 240 hours)  Resp panel by RT-PCR (RSV, Flu A&B, Covid) Anterior Nasal Swab     Status: None   Collection Time: 09/22/24 12:55 PM   Specimen: Anterior Nasal Swab  Result Value Ref Range Status   SARS Coronavirus 2 by RT PCR NEGATIVE NEGATIVE Final    Comment: (NOTE) SARS-CoV-2 target nucleic acids are NOT DETECTED.  The SARS-CoV-2 RNA is generally detectable in upper respiratory specimens during the acute phase of infection. The lowest concentration of SARS-CoV-2 viral copies this assay can detect is 138 copies/mL. A negative result does not preclude SARS-Cov-2 infection and should not be used as the sole basis for treatment or other patient management decisions. A negative result may occur with  improper specimen collection/handling, submission of specimen other than nasopharyngeal swab, presence of viral mutation(s) within the areas targeted by this assay, and inadequate number of viral copies(<138 copies/mL). A negative result must be combined with clinical observations, patient history, and epidemiological information. The expected result is Negative.  Fact Sheet for Patients:  bloggercourse.com  Fact Sheet for Healthcare Providers:  seriousbroker.it  This test is no t yet approved or cleared by the [place]  FDA and  has been authorized for detection and/or diagnosis of SARS-CoV-2 by FDA under an Emergency Use Authorization (EUA). This EUA will remain  in effect (meaning this test can be used) for the duration of the COVID-19 declaration under Section 564(b)(1) of the Act, 21 U.S.C.section 360bbb-3(b)(1), unless the authorization is terminated  or revoked sooner.       Influenza A by PCR NEGATIVE NEGATIVE Final   Influenza B by PCR NEGATIVE NEGATIVE Final    Comment: (NOTE) The  Xpert Xpress SARS-CoV-2/FLU/RSV plus assay is intended as an aid in the diagnosis of influenza from Nasopharyngeal swab specimens and should not be used as a sole basis for treatment. Nasal washings and aspirates are unacceptable for Xpert Xpress SARS-CoV-2/FLU/RSV testing.  Fact Sheet for Patients: bloggercourse.com  Fact Sheet for Healthcare Providers: seriousbroker.it  This test is not yet approved or cleared by the [place]  FDA and has been authorized for detection and/or diagnosis of SARS-CoV-2 by FDA under an Emergency Use Authorization (EUA). This EUA will remain in effect (meaning this test can be used) for the duration of the COVID-19 declaration under Section 564(b)(1) of the Act, 21 U.S.C. section 360bbb-3(b)(1), unless the authorization is terminated or revoked.     Resp Syncytial Virus by PCR NEGATIVE NEGATIVE Final    Comment: (NOTE) Fact Sheet for Patients: bloggercourse.com  Fact Sheet for Healthcare Providers: seriousbroker.it  This test is not yet approved or cleared by the [place]  FDA and has been authorized for detection and/or diagnosis of SARS-CoV-2 by FDA under an Emergency Use Authorization (EUA). This EUA will remain in effect (meaning this test can be used) for the duration of the COVID-19 declaration under Section 564(b)(1) of the Act, 21 U.S.C. section 360bbb-3(b)(1), unless the authorization is terminated or revoked.  Performed at [facility], [address] [place], [place] [address]   Resp panel by RT-PCR (RSV, Flu A&B, Covid) Anterior Nasal Swab     Status: None   Collection Time: 09/24/24 11:10 PM   Specimen: Anterior Nasal Swab  Result Value Ref Range Status   SARS Coronavirus 2 by RT PCR NEGATIVE NEGATIVE Final   Influenza A by PCR NEGATIVE NEGATIVE Final   Influenza B by PCR NEGATIVE NEGATIVE Final  Comment:  (NOTE) The Xpert Xpress SARS-CoV-2/FLU/RSV plus assay is intended as an aid in the diagnosis of influenza from Nasopharyngeal swab specimens and should not be used as a sole basis for treatment. Nasal washings and aspirates are unacceptable for Xpert Xpress SARS-CoV-2/FLU/RSV testing.  Fact Sheet for Patients: bloggercourse.com  Fact Sheet for Healthcare Providers: seriousbroker.it  This test is not yet approved or cleared by the [place]  FDA and has been authorized for detection and/or diagnosis of SARS-CoV-2 by FDA under an Emergency Use Authorization (EUA). This EUA will remain in effect (meaning this test can be used) for the duration of the COVID-19 declaration under Section 564(b)(1) of the Act, 21 U.S.C. section 360bbb-3(b)(1), unless the authorization is terminated or revoked.     Resp Syncytial Virus by PCR NEGATIVE NEGATIVE Final    Comment: (NOTE) Fact Sheet for Patients: bloggercourse.com  Fact Sheet for Healthcare Providers: seriousbroker.it  This test is not yet approved or cleared by the [place]  FDA and has been authorized for detection and/or diagnosis of SARS-CoV-2 by FDA under an Emergency Use Authorization (EUA). This EUA will remain in effect (meaning this test can be used) for the duration of the COVID-19 declaration under Section 564(b)(1) of the Act, 21 U.S.C. section 360bbb-3(b)(1), unless the authorization is terminated or revoked.  Performed at [facility], [address], [place], [place] [address]   Blood culture (routine x 2)     Status: Abnormal   Collection Time: 09/25/24  2:20 AM   Specimen: BLOOD  Result Value Ref Range Status   Specimen Description BLOOD SITE NOT SPECIFIED  Final   Special Requests   Final    BOTTLES DRAWN AEROBIC AND ANAEROBIC Blood Culture adequate volume   Culture  Setup Time   Final    GRAM POSITIVE  COCCI IN BOTH AEROBIC AND ANAEROBIC BOTTLES CRITICAL RESULT CALLED TO, READ BACK BY AND VERIFIED WITH: PHARMD C. AMEND [phone] @ [phone] Performed at [facility], [address], [place], [place] [address]    Culture ENTEROCOCCUS FAECALIS (A)  Final   Report Status 09/27/2024 FINAL  Final   Organism ID, Bacteria ENTEROCOCCUS FAECALIS  Final      Susceptibility   Enterococcus faecalis - MIC*    AMPICILLIN  <=2 SENSITIVE Sensitive     VANCOMYCIN  1 SENSITIVE Sensitive     GENTAMICIN  SYNERGY SENSITIVE Sensitive     * ENTEROCOCCUS FAECALIS  Blood Culture ID Panel (Reflexed)     Status: Abnormal   Collection Time: 09/25/24  2:20 AM  Result Value Ref Range Status   Enterococcus faecalis DETECTED (A) NOT DETECTED Final    Comment: CRITICAL RESULT CALLED TO, READ BACK BY AND VERIFIED WITH: PHARMD C. AMEND [phone] @ 1638 FH    Enterococcus Faecium NOT DETECTED NOT DETECTED Final   Listeria monocytogenes NOT DETECTED NOT DETECTED Final   Staphylococcus species NOT DETECTED NOT DETECTED Final   Staphylococcus aureus (BCID) NOT DETECTED NOT DETECTED Final   Staphylococcus epidermidis NOT DETECTED NOT DETECTED Final   Staphylococcus lugdunensis NOT DETECTED NOT DETECTED Final   Streptococcus species NOT DETECTED NOT DETECTED Final   Streptococcus agalactiae NOT DETECTED NOT DETECTED Final   Streptococcus pneumoniae NOT DETECTED NOT DETECTED Final   Streptococcus pyogenes NOT DETECTED NOT DETECTED Final   A.calcoaceticus-baumannii NOT DETECTED NOT DETECTED Final   Bacteroides fragilis NOT DETECTED NOT DETECTED Final   Enterobacterales NOT DETECTED NOT DETECTED Final   Enterobacter cloacae complex NOT DETECTED NOT DETECTED Final  Escherichia coli NOT DETECTED NOT DETECTED Final   Klebsiella aerogenes NOT DETECTED NOT DETECTED Final   Klebsiella oxytoca NOT DETECTED NOT DETECTED Final   Klebsiella pneumoniae NOT DETECTED NOT DETECTED Final   Proteus species NOT DETECTED NOT DETECTED Final    Salmonella species NOT DETECTED NOT DETECTED Final   Serratia marcescens NOT DETECTED NOT DETECTED Final   Haemophilus influenzae NOT DETECTED NOT DETECTED Final   Neisseria meningitidis NOT DETECTED NOT DETECTED Final   Pseudomonas aeruginosa NOT DETECTED NOT DETECTED Final   Stenotrophomonas maltophilia NOT DETECTED NOT DETECTED Final   Candida albicans NOT DETECTED NOT DETECTED Final   Candida auris NOT DETECTED NOT DETECTED Final   Candida glabrata NOT DETECTED NOT DETECTED Final   Candida krusei NOT DETECTED NOT DETECTED Final   Candida parapsilosis NOT DETECTED NOT DETECTED Final   Candida tropicalis NOT DETECTED NOT DETECTED Final   Cryptococcus neoformans/gattii NOT DETECTED NOT DETECTED Final   Vancomycin  resistance NOT DETECTED NOT DETECTED Final    Comment: Performed at [facility], [address] [place], [place] [address]  Blood culture (routine x 2)     Status: None   Collection Time: 09/25/24  7:02 PM   Specimen: BLOOD LEFT HAND  Result Value Ref Range Status   Specimen Description BLOOD LEFT HAND  Final   Special Requests   Final    BOTTLES DRAWN AEROBIC AND ANAEROBIC Blood Culture adequate volume   Culture   Final    NO GROWTH 5 DAYS Performed at [facility], [address] [place], [place] [address]    Report Status 09/30/2024 FINAL  Final  Culture, blood (Routine X 2) w Reflex to ID Panel     Status: None   Collection Time: 09/26/24 10:30 AM   Specimen: BLOOD RIGHT ARM  Result Value Ref Range Status   Specimen Description BLOOD RIGHT ARM  Final   Special Requests   Final    BOTTLES DRAWN AEROBIC AND ANAEROBIC Blood Culture adequate volume   Culture   Final    NO GROWTH 5 DAYS Performed at [facility], [address], [place], [place] [address]    Report Status 10/01/2024 FINAL  Final  Culture, blood (Routine X 2) w Reflex to ID Panel     Status: None   Collection Time: 09/26/24 10:41 AM   Specimen: BLOOD  Result Value Ref Range  Status   Specimen Description BLOOD BLOOD RIGHT HAND  Final   Special Requests   Final    BOTTLES DRAWN AEROBIC AND ANAEROBIC Blood Culture adequate volume   Culture   Final    NO GROWTH 5 DAYS Performed at [facility], [address], [place], [place] [address]    Report Status 10/01/2024 FINAL  Final     Studies: US  EKG SITE RITE Result Date: 10/01/2024 If Site Rite image not attached, placement could not be confirmed due to current cardiac rhythm.  CT ANGIO HEAD NECK W WO CM Result Date: 10/01/2024 EXAM: CTA Head and Neck with Intravenous Contrast. CT Head without Contrast. CLINICAL HISTORY: Stroke/TIA, determine embolic source TECHNIQUE: Axial CTA images of the head and neck performed with intravenous contrast. MIP reconstructed images were created and reviewed. Axial computed tomography images of the head/brain performed without intravenous contrast. Note: Per PQRS, the description of internal carotid artery percent stenosis, including 0 percent or normal exam, is based on [place] American Symptomatic Carotid Endarterectomy Trial (NASCET) criteria. Dose reduction technique was  used including one or more of the following: automated exposure control, adjustment of mA and kV according to patient size, and/or iterative reconstruction. CONTRAST: With; COMPARISON: MRI Head 09/29/14 FINDINGS: CT HEAD: BRAIN: No acute intraparenchymal hemorrhage. No mass lesion. Known left frontal infarcts better characterized on recent MRI. Remote right basal ganglia/corona radiata infarct. No midline shift or extra-axial collection. VENTRICLES: No hydrocephalus. ORBITS: The orbits are unremarkable. SINUSES AND MASTOIDS: The paranasal sinuses and mastoid air cells are clear. CTA NECK: COMMON CAROTID ARTERIES: No significant stenosis. No dissection or occlusion. INTERNAL CAROTID ARTERIES: No stenosis by NASCET criteria. No dissection or occlusion. VERTEBRAL ARTERIES: Multifocal irregularity of the left V2 vertebral  artery with occluded V3 segment. Right vertebral artery is patent. CTA HEAD: ANTERIOR CEREBRAL ARTERIES: Severe left ACA origin stenosis, nearly occlusive. No occlusion. No aneurysm. MIDDLE CEREBRAL ARTERIES: No significant stenosis. No occlusion. No aneurysm. POSTERIOR CEREBRAL ARTERIES: No significant stenosis. No occlusion. No aneurysm. BASILAR ARTERY: No significant stenosis. No occlusion. No aneurysm. SOFT TISSUES: No acute finding. Emphysema. BONES: No acute osseous abnormality. OTHER: Findings will be communicated to the clinical team by the radiology assistant and documented in PACs/Clario. IMPRESSION: 1. Multifocal irregularity of the left V2 vertebral artery with occluded V3 segment, which could be due to dissection or atherosclerosis. 2. Severe (nearly occlusive) left ACA origin stenosis with preserved distal opacification. Electronically signed by: Glendia Molt MD 10/01/2024 02:33 AM EST RP Workstation: [id number]   MR BRAIN W WO CONTRAST Result Date: 09/29/2024 EXAM: MRI BRAIN WITH AND WITHOUT CONTRAST 09/29/2024 04:19:34 PM TECHNIQUE: Multiplanar multisequence MRI of the head/brain was performed with and without the administration of intravenous contrast. CONTRAST: 8 mL of GADAVIST  COMPARISON: CT HEAD WITHOUT CONTRAST 09/28/2024 CLINICAL HISTORY: Rule out stroke. 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 Single acute infarct in the right parietal lobe. Patchy T2 hyperintensity in the white matter is compatible with chronic microvascular ischemic change. Remote lacunar infarct in the right corona radiata. No acute intracranial hemorrhage. No mass effect or midline shift. No hydrocephalus. The sella is unremarkable. Normal flow voids. No abnormal enhancement. ORBITS: No significant abnormality. SINUSES: No significant abnormality. BONES AND SOFT TISSUES: Normal bone marrow signal. No soft tissue abnormality. IMPRESSION: 1. Many acute left frontal  cortical and subcortical infarcts. 2.  Single acute infarct in the right parietal lobe. 3. Remote lacunar infarct in the right corona radiata and mild for age chronic microvascular ischemic change. Electronically signed by: Glendia Molt MD 09/29/2024 06:59 PM EST RP Workstation: [id number]      Vernal Alstrom, MD  [facility] 10/01/2024  If 7PM-7AM, please contact night-coverage             "

## 2024-10-02 DIAGNOSIS — I639 Cerebral infarction, unspecified: Secondary | ICD-10-CM | POA: Diagnosis not present

## 2024-10-02 LAB — GLUCOSE, CAPILLARY
Glucose-Capillary: 108 mg/dL — ABNORMAL HIGH (ref 70–99)
Glucose-Capillary: 129 mg/dL — ABNORMAL HIGH (ref 70–99)
Glucose-Capillary: 105 mg/dL — ABNORMAL HIGH (ref 70–99)
Glucose-Capillary: 128 mg/dL — ABNORMAL HIGH (ref 70–99)

## 2024-10-02 NOTE — Progress Notes (Signed)
 " PROGRESS NOTE  [name] [name] FMW:[id number] DOB: February 10, 1946 DOA: 09/24/2024 PCP: [name], MD   LOS: 7 days   Brief narrative:  The patient is a 79 yr old male with BPH, hypertension, hyperlipidemia, diabetes mellitus type 2, CAD, paroxysmal atrial fibrillation, valvular heart disease status post AV block status post dual-chamber pacemaker in November 2011 and generator change in 2022, diabetes mellitus presented to the hospital for fever of 2 weeks with generalized weakness and brain fog.  In the ED patient was hemodynamically stable.  Initial WBC was 13 with sodium of 132.  Lipase elevated at 148 but lactate was normal.  Patient was then started on Zosyn  and vancomycin .  ID was consulted and 2D echocardiogram, MRI of the T and L spine was performed.  MRI did not show evidence of lumbar spine infection but diffuse disc and facet degeneration without high-grade spinal stenosis, there is also moderate to severe right and moderate left neural foraminal stenosis at L5-S1. There is also mild to moderal lateral recess stenosis at L3-L4.  Blood cultures showed Enterococcus faecalis. Infectious disease has been consulted.  Underwent TEE 09/29/2024 and was noted to have thickening of the valves concerning for endocarditis and electrophysiology cardiology and CT surgery were consulted.  MRI of the brain showed  stroke so neurology was also consulted.  At this time,   patient will need at least 6 weeks of IV antibiotics.  Currently, receiving high dose ampicillin , ceftriaxone .     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 Fleeta Trigt 09/2010   Status post placement of cardiac pacemaker   Type 2 diabetes mellitus without complication, without long-term current use of insulin  (HCC)   Essential hypertension   Paroxysmal atrial fibrillation (HCC)   History of complete AV block    Aortic stenosis s/p aortic valve placement   History of CAD (coronary artery disease)   Non-insulin  dependent type 2 diabetes mellitus (HCC)   Hypokalemia   Prosthetic valve endocarditis   Pacemaker infection   Fever of unknown origin -  Enterococcus faecalis Bacteremia with septic emboli to the brain CBC showed leukocytosis.SABRA  MRI of the lumbar spine and MRI of the thoracic spine has ruled out discitis/osteomyelitis.  2D echocardiogram showed bacteremia.. No vegetations were seen on TTE.  TEE was performed on 09/29/2024 with thickening suggestive of prosthetic aortic valve endocarditis.  Patient also has pacemaker in place.  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  Infectious disease , cardiothoracic surgery and electrophysiology cardiology on board.  Electrophysiology cardiology with an impression that device extraction would not be beneficial.  Cardiothoracic surgery with an impression that patient is not a candidate for redo AVR or possible root replacement with multiple embolic strokes, significant MV disease and ascending aortic aneurysm.  Patient is currently on ampicillin  and ceftriaxone .  Plan is to continue IV antibiotics for 6 weeks.  Patient already has PICC line in place.  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   Had brain fog initially with a distant history of slurred speech.  Incidentally noted.  No focal deficits at this time.  Neurology was consulted and  CTA of the head and neck done.  Patient was on Eliquis  until 09/28/2024 on hold and after discussing with neurology, plan is to restart eliquis .  Patient has been restarted on Eliquis  since 10/01/2024.  On dysphagia 3 diet at this time.   Chronic  back pain with sciatica  MRI lumbar and thoracic spine have ruled out infectious process in the spine. 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  Continue supportive care.   Hypokalemia Replenished. Check bmp in am.   History of aortic stenosis s/p bioprosthesis aortic valve  placement No vegetations seen on TTE.  Status post TEE  09/29/2024 there is concern for infective endocarditis.  Electrophysiology cardiology surgery does not recommend pacer extraction.   History of paroxysmal atrial fibrillation History of AV block status post pacemaker Multiple embolic strokes EKG showed PVCs and A-fib.  Was on Eliquis  and metoprolol .  Resumed  Chronic diastolic heart failure History of essential hypertension On metoprolol , amlodipine .   History of CAD No acute disease.  Continue statin and Lopressor .  Eliquis  on hold at this time.   Insulin -dependent DM type II Continue Jardiance , sliding scale insulin .  Closely monitor.  Last POC glucose of 90   DVT prophylaxis: Place and maintain sequential compression device Start: 09/29/24 1244 SCDs Start: 09/25/24 0227 Place TED hose Start: 09/25/24 0227 apixaban  (ELIQUIS ) tablet 5 mg   Disposition: Skilled nursing facility as per PT evaluation  Status is: Inpatient Remains inpatient appropriate because: Pending clinical improvement, need for rehabitation, on IV antibiotics,     Code Status:     Code Status: Full Code  Family Communication: Spoke with  patient niece who is also the health care power of attorney on 09/30/2024.  Consultants: Cardiology Infectious disease Electrophysiology cardiology CT surgery Neurology   Procedures: TEE on 09/29/2024  Anti-infectives:  Ampicillin  and Rocephin  IV  Anti-infectives (From admission, onward)    Start     Dose/Rate Route Frequency Ordered Stop   09/26/24 0400  ampicillin  (OMNIPEN) 2 g in sodium chloride  0.9 % 100 mL IVPB        2 g 300 mL/hr over 20 Minutes Intravenous Every 4 hours 09/25/24 2359 11/07/24 2359   09/26/24 0400  cefTRIAXone  (ROCEPHIN ) 2 g in sodium chloride  0.9 % 100 mL IVPB        2 g 200 mL/hr over 30 Minutes Intravenous Every 12 hours 09/25/24 2359 11/07/24 2359   09/25/24 1600  vancomycin  (VANCOCIN ) IVPB 1000 mg/200 mL premix  Status:   Discontinued        1,000 mg 200 mL/hr over 60 Minutes Intravenous Every 12 hours 09/25/24 0311 09/26/24 0000   09/25/24 0800  piperacillin -tazobactam (ZOSYN ) IVPB 3.375 g  Status:  Discontinued        3.375 g 12.5 mL/hr over 240 Minutes Intravenous Every 8 hours 09/25/24 0311 09/26/24 0000   09/25/24 0215  piperacillin -tazobactam (ZOSYN ) IVPB 3.375 g        3.375 g 100 mL/hr over 30 Minutes Intravenous  Once 09/25/24 0204 09/25/24 0337   09/25/24 0215  vancomycin  (VANCOREADY) IVPB 2000 mg/400 mL        2,000 mg 200 mL/hr over 120 Minutes Intravenous  Once 09/25/24 0204 09/25/24 0700       Subjective: Today, patient was seen and examined at bedside.  Patient states that he is diarrhea has slightly gotten better.  Denies any nausea vomiting fever chills shortness of breath or dyspnea.   Objective: Vitals:   10/02/24 0215 10/02/24 0730  BP: 133/68 136/66  Pulse: 95 84  Resp: 19 16  Temp: 97.6 F (36.4 C) 98.6 F (37 C)  SpO2: 96% 95%    Intake/Output Summary (Last 24 hours) at 10/02/2024 1255 Last data filed at 10/02/2024 0759 Gross per 24 hour  Intake  700 ml  Output 350 ml  Net 350 ml   Filed Weights   09/29/24 0813  Weight: 88 kg   Body mass index is 27.06 kg/m.   Physical Exam:  General:  Average built, not in obvious distress, elderly male, average built, HENT:   No scleral pallor or icterus noted. Oral mucosa is moist.  Chest:  Clear breath sounds.  Diminished breath sounds bilaterally. No crackles or wheezes.  Chest wall pacer in place. CVS: S1 &S2 heard. No murmur.  Regular rate and rhythm. Abdomen: Soft, nontender, nondistended.  Bowel sounds are heard.  External urinary catheter in place. Extremities: No cyanosis, clubbing or edema.  Peripheral pulses are palpable.  Right upper extremity PICC line in place Psych: Alert, awake and oriented, normal mood CNS:  No cranial nerve deficits.  Power equal in all extremities.   Skin: Warm and dry.  No rashes  noted.   Data Review: I have personally reviewed the following laboratory data and studies,  CBC: Recent Labs  Lab 09/26/24 0456 09/27/24 0428 09/28/24 0439 09/29/24 0516 09/30/24 0442  WBC 12.0* 13.6* 13.4* 16.3* 18.0*  NEUTROABS 9.6* 10.7* 10.4* 13.7*  --   HGB 12.5* 12.6* 13.7 13.9 13.7  HCT 35.4* 36.6* 40.5 40.1 39.1  MCV 88.5 89.7 91.2 89.7 89.7  PLT 178 203 194 223 252   Basic Metabolic Panel: Recent Labs  Lab 09/27/24 0428 09/28/24 0439 09/29/24 0516 09/30/24 0442 10/01/24 0528  NA 132* 135 133* 135 137  K 4.4 3.9 3.0* 3.1* 3.3*  CL 98 99 97* 99 100  CO2 22 21* 20* 20* 21*  GLUCOSE 83 86 107* 144* 95  BUN 13 13 12 19 18   CREATININE 0.67 0.55* 0.50* 0.50* 0.51*  CALCIUM  8.0* 8.1* 7.9* 7.8* 7.8*  PHOS  --   --   --  3.4  --    Liver Function Tests: No results for input(s): AST, ALT, ALKPHOS, BILITOT, PROT, ALBUMIN in the last 168 hours.  No results for input(s): LIPASE, AMYLASE in the last 168 hours.  No results for input(s): AMMONIA in the last 168 hours. Cardiac Enzymes: No results for input(s): CKTOTAL, CKMB, CKMBINDEX, TROPONINI in the last 168 hours. BNP (last 3 results) No results for input(s): BNP in the last 8760 hours.  ProBNP (last 3 results) No results for input(s): PROBNP in the last 8760 hours.  CBG: Recent Labs  Lab 10/01/24 1143 10/01/24 1653 10/01/24 2121 10/02/24 0624 10/02/24 1113  GLUCAP 108* 93 86 108* 129*   Recent Results (from the past 240 hours)  Resp panel by RT-PCR (RSV, Flu A&B, Covid) Anterior Nasal Swab     Status: None   Collection Time: 09/24/24 11:10 PM   Specimen: Anterior Nasal Swab  Result Value Ref Range Status   SARS Coronavirus 2 by RT PCR NEGATIVE NEGATIVE Final   Influenza A by PCR NEGATIVE NEGATIVE Final   Influenza B by PCR NEGATIVE NEGATIVE Final    Comment: (NOTE) The Xpert Xpress SARS-CoV-2/FLU/RSV plus assay is intended as an aid in the diagnosis of influenza from  Nasopharyngeal swab specimens and should not be used as a sole basis for treatment. Nasal washings and aspirates are unacceptable for Xpert Xpress SARS-CoV-2/FLU/RSV testing.  Fact Sheet for Patients: bloggercourse.com  Fact Sheet for Healthcare Providers: seriousbroker.it  This test is not yet approved or cleared by the [place]  FDA and has been authorized for detection and/or diagnosis of SARS-CoV-2 by FDA under an Emergency Use Authorization (EUA). This EUA  will remain in effect (meaning this test can be used) for the duration of the COVID-19 declaration under Section 564(b)(1) of the Act, 21 U.S.C. section 360bbb-3(b)(1), unless the authorization is terminated or revoked.     Resp Syncytial Virus by PCR NEGATIVE NEGATIVE Final    Comment: (NOTE) Fact Sheet for Patients: bloggercourse.com  Fact Sheet for Healthcare Providers: seriousbroker.it  This test is not yet approved or cleared by the [place]  FDA and has been authorized for detection and/or diagnosis of SARS-CoV-2 by FDA under an Emergency Use Authorization (EUA). This EUA will remain in effect (meaning this test can be used) for the duration of the COVID-19 declaration under Section 564(b)(1) of the Act, 21 U.S.C. section 360bbb-3(b)(1), unless the authorization is terminated or revoked.  Performed at [facility], [address], [place], [place] [address]   Blood culture (routine x 2)     Status: Abnormal   Collection Time: 09/25/24  2:20 AM   Specimen: BLOOD  Result Value Ref Range Status   Specimen Description BLOOD SITE NOT SPECIFIED  Final   Special Requests   Final    BOTTLES DRAWN AEROBIC AND ANAEROBIC Blood Culture adequate volume   Culture  Setup Time   Final    GRAM POSITIVE COCCI IN BOTH AEROBIC AND ANAEROBIC BOTTLES CRITICAL RESULT CALLED TO, READ BACK BY AND VERIFIED WITH: PHARMD  C. AMEND [id number] @ [id number] FH Performed at [facility], [address] [place], [place] [address]    Culture ENTEROCOCCUS FAECALIS (A)  Final   Report Status 09/27/2024 FINAL  Final   Organism ID, Bacteria ENTEROCOCCUS FAECALIS  Final      Susceptibility   Enterococcus faecalis - MIC*    AMPICILLIN  <=2 SENSITIVE Sensitive     VANCOMYCIN  1 SENSITIVE Sensitive     GENTAMICIN  SYNERGY SENSITIVE Sensitive     * ENTEROCOCCUS FAECALIS  Blood Culture ID Panel (Reflexed)     Status: Abnormal   Collection Time: 09/25/24  2:20 AM  Result Value Ref Range Status   Enterococcus faecalis DETECTED (A) NOT DETECTED Final    Comment: CRITICAL RESULT CALLED TO, READ BACK BY AND VERIFIED WITH: PHARMD C. AMEND [id number] @ 1638 FH    Enterococcus Faecium NOT DETECTED NOT DETECTED Final   Listeria monocytogenes NOT DETECTED NOT DETECTED Final   Staphylococcus species NOT DETECTED NOT DETECTED Final   Staphylococcus aureus (BCID) NOT DETECTED NOT DETECTED Final   Staphylococcus epidermidis NOT DETECTED NOT DETECTED Final   Staphylococcus lugdunensis NOT DETECTED NOT DETECTED Final   Streptococcus species NOT DETECTED NOT DETECTED Final   Streptococcus agalactiae NOT DETECTED NOT DETECTED Final   Streptococcus pneumoniae NOT DETECTED NOT DETECTED Final   Streptococcus pyogenes NOT DETECTED NOT DETECTED Final   A.calcoaceticus-baumannii NOT DETECTED NOT DETECTED Final   Bacteroides fragilis NOT DETECTED NOT DETECTED Final   Enterobacterales NOT DETECTED NOT DETECTED Final   Enterobacter cloacae complex NOT DETECTED NOT DETECTED Final   Escherichia coli NOT DETECTED NOT DETECTED Final   Klebsiella aerogenes NOT DETECTED NOT DETECTED Final   Klebsiella oxytoca NOT DETECTED NOT DETECTED Final   Klebsiella pneumoniae NOT DETECTED NOT DETECTED Final   Proteus species NOT DETECTED NOT DETECTED Final   Salmonella species NOT DETECTED NOT DETECTED Final   Serratia marcescens NOT DETECTED NOT DETECTED Final    Haemophilus influenzae NOT DETECTED NOT DETECTED Final   Neisseria meningitidis NOT DETECTED NOT DETECTED Final   Pseudomonas aeruginosa NOT DETECTED NOT DETECTED Final  Stenotrophomonas maltophilia NOT DETECTED NOT DETECTED Final   Candida albicans NOT DETECTED NOT DETECTED Final   Candida auris NOT DETECTED NOT DETECTED Final   Candida glabrata NOT DETECTED NOT DETECTED Final   Candida krusei NOT DETECTED NOT DETECTED Final   Candida parapsilosis NOT DETECTED NOT DETECTED Final   Candida tropicalis NOT DETECTED NOT DETECTED Final   Cryptococcus neoformans/gattii NOT DETECTED NOT DETECTED Final   Vancomycin  resistance NOT DETECTED NOT DETECTED Final    Comment: Performed at [facility], [address], [place], [place] [address]  Blood culture (routine x 2)     Status: None   Collection Time: 09/25/24  7:02 PM   Specimen: BLOOD LEFT HAND  Result Value Ref Range Status   Specimen Description BLOOD LEFT HAND  Final   Special Requests   Final    BOTTLES DRAWN AEROBIC AND ANAEROBIC Blood Culture adequate volume   Culture   Final    NO GROWTH 5 DAYS Performed at [facility], [address], [place], [place] [address]    Report Status 09/30/2024 FINAL  Final  Culture, blood (Routine X 2) w Reflex to ID Panel     Status: None   Collection Time: 09/26/24 10:30 AM   Specimen: BLOOD RIGHT ARM  Result Value Ref Range Status   Specimen Description BLOOD RIGHT ARM  Final   Special Requests   Final    BOTTLES DRAWN AEROBIC AND ANAEROBIC Blood Culture adequate volume   Culture   Final    NO GROWTH 5 DAYS Performed at [facility], [address] [place], [place] [address]    Report Status 10/01/2024 FINAL  Final  Culture, blood (Routine X 2) w Reflex to ID Panel     Status: None   Collection Time: 09/26/24 10:41 AM   Specimen: BLOOD  Result Value Ref Range Status   Specimen Description BLOOD BLOOD RIGHT HAND  Final   Special Requests   Final    BOTTLES DRAWN AEROBIC  AND ANAEROBIC Blood Culture adequate volume   Culture   Final    NO GROWTH 5 DAYS Performed at [facility], [address], [place], [place] [address]    Report Status 10/01/2024 FINAL  Final     Studies: US  EKG SITE RITE Result Date: 10/01/2024 If Site Rite image not attached, placement could not be confirmed due to current cardiac rhythm.  CT ANGIO HEAD NECK W WO CM Result Date: 10/01/2024 EXAM: CTA Head and Neck with Intravenous Contrast. CT Head without Contrast. CLINICAL HISTORY: Stroke/TIA, determine embolic source TECHNIQUE: Axial CTA images of the head and neck performed with intravenous contrast. MIP reconstructed images were created and reviewed. Axial computed tomography images of the head/brain performed without intravenous contrast. Note: Per PQRS, the description of internal carotid artery percent stenosis, including 0 percent or normal exam, is based on [place] American Symptomatic Carotid Endarterectomy Trial (NASCET) criteria. Dose reduction technique was used including one or more of the following: automated exposure control, adjustment of mA and kV according to patient size, and/or iterative reconstruction. CONTRAST: With; COMPARISON: MRI Head 09/29/14 FINDINGS: CT HEAD: BRAIN: No acute intraparenchymal hemorrhage. No mass lesion. Known left frontal infarcts better characterized on recent MRI. Remote right basal ganglia/corona radiata infarct. No midline shift or extra-axial collection. VENTRICLES: No hydrocephalus. ORBITS: The orbits are unremarkable. SINUSES AND MASTOIDS: The paranasal sinuses and mastoid air cells are clear. CTA NECK: COMMON CAROTID ARTERIES: No significant stenosis. No dissection  or occlusion. INTERNAL CAROTID ARTERIES: No stenosis by NASCET criteria. No dissection or occlusion. VERTEBRAL ARTERIES: Multifocal irregularity of the left V2 vertebral artery with occluded V3 segment. Right vertebral artery is patent. CTA HEAD: ANTERIOR CEREBRAL ARTERIES: Severe  left ACA origin stenosis, nearly occlusive. No occlusion. No aneurysm. MIDDLE CEREBRAL ARTERIES: No significant stenosis. No occlusion. No aneurysm. POSTERIOR CEREBRAL ARTERIES: No significant stenosis. No occlusion. No aneurysm. BASILAR ARTERY: No significant stenosis. No occlusion. No aneurysm. SOFT TISSUES: No acute finding. Emphysema. BONES: No acute osseous abnormality. OTHER: Findings will be communicated to the clinical team by the radiology assistant and documented in PACs/Clario. IMPRESSION: 1. Multifocal irregularity of the left V2 vertebral artery with occluded V3 segment, which could be due to dissection or atherosclerosis. 2. Severe (nearly occlusive) left ACA origin stenosis with preserved distal opacification. Electronically signed by: Glendia Molt MD 10/01/2024 02:33 AM EST RP Workstation: [id number]      Vernal Alstrom, MD  [facility] 10/02/2024  If 7PM-7AM, please contact night-coverage             "

## 2024-10-02 NOTE — [place] FL2 (Addendum)
 " [place]  MEDICAID [id number] LEVEL OF CARE FORM     IDENTIFICATION  Patient Name: [name] [name] Birthdate: Nov 29, 1945 Sex: male Admission Date (Current Location): 09/24/2024  [facility] and [place] Number:  Producer, Television/film/video and Address:  [facility] [place], 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   Pacemaker infection 09/29/2024   Enterococcus faecalis infection 09/26/2024   Fever of unknown origin 09/25/2024   History of complete AV block 09/25/2024   Aortic stenosis s/p aortic valve placement 09/25/2024   History of CAD (coronary artery disease) 09/25/2024   Non-insulin  dependent type 2 diabetes mellitus (HCC) 09/25/2024   Hypokalemia 09/25/2024   Malignant neoplasm of prostate (HCC) 10/25/2021   Pacemaker battery depletion 05/23/2021   Dyslipidemia 12/27/2020   History of colon polyps 07/04/2018   Constipation 07/04/2018   Hypertensive retinopathy of both eyes 12/25/2017   Arcus senilis, bilateral 12/25/2017   Nuclear sclerotic cataract of both eyes 12/21/2016   Acute bilateral low back pain without sciatica 10/25/2016   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 Fleeta Trigt 09/2010 06/18/2013   Second degree AV block, Mobitz type I  06/18/2013   Status post placement of cardiac pacemaker 06/18/2013   Type 2 diabetes mellitus without complication, without long-term current use of insulin  (HCC) 06/18/2013   Mixed hyperlipidemia 06/18/2013   Essential hypertension 06/18/2013    Paroxysmal atrial fibrillation (HCC) 06/18/2013    Orientation RESPIRATION BLADDER Height & Weight     Self, Time, Situation, Place  Normal Incontinent, External catheter Weight: 194 lb 0.1 oz (88 kg) Height:  5' 11 (180.3 cm)  BEHAVIORAL SYMPTOMS/MOOD NEUROLOGICAL BOWEL NUTRITION STATUS      Incontinent Diet (see discharge summary)  AMBULATORY STATUS COMMUNICATION OF NEEDS Skin   Total Care Verbally Other (Comment) (ecchymosis)                       Personal Care Assistance Level of Assistance  Bathing, Feeding, Dressing Bathing Assistance: Maximum assistance Feeding assistance: Limited assistance Dressing Assistance: Maximum assistance     Functional Limitations Info  Sight, Hearing, Speech Sight Info: Adequate Hearing Info: Adequate Speech Info: Adequate    SPECIAL CARE FACTORS FREQUENCY  PT (By licensed PT), OT (By licensed OT)     PT Frequency: 5x week OT Frequency: 5x week            Contractures Contractures Info: Not present    Additional Factors Info  Code Status, Allergies, Insulin  Sliding Scale Code Status Info: full Allergies Info: NKA   Insulin  Sliding Scale Info: novolog : see discharge summary       Current Medications (10/02/2024):  This is the current hospital active medication list Current Facility-Administered Medications  Medication Dose Route Frequency Provider Last Rate Last Admin   alum & mag hydroxide-simeth (MAALOX/MYLANTA) 200-200-20 MG/5ML suspension 30 mL  30 mL Oral Q6H PRN Azobou Donley Joelle DEL, MD   30 mL at 09/30/24 0824   amLODipine  (NORVASC ) tablet 5 mg  5 mg Oral  Daily Pokhrel, Laxman, MD   5 mg at 10/02/24 0838   ampicillin  (OMNIPEN) 2 g in sodium chloride  0.9 % 100 mL IVPB  2 g Intravenous Q4H Fleeta Rothman, Jomarie SAILOR, MD 300 mL/hr at 10/02/24 0847 2 g at 10/02/24 0847   apixaban  (ELIQUIS ) tablet 5 mg  5 mg Oral BID Pokhrel, Laxman, MD   5 mg at 10/02/24 9161   atorvastatin  (LIPITOR) tablet 40 mg  40 mg Oral Daily Azobou  Tonleu, Franck H, MD   40 mg at 10/02/24 9161   cefTRIAXone  (ROCEPHIN ) 2 g in sodium chloride  0.9 % 100 mL IVPB  2 g Intravenous Q12H Fleeta Rothman, Jomarie SAILOR, MD 200 mL/hr at 10/02/24 0847 2 g at 10/02/24 0847   Chlorhexidine  Gluconate Cloth 2 % PADS 6 each  6 each Topical Daily Pokhrel, Laxman, MD   6 each at 10/02/24 9161   empagliflozin  (JARDIANCE ) tablet 10 mg  10 mg Oral Daily Azobou Tonleu, Franck H, MD   10 mg at 10/02/24 9161   feeding supplement (ENSURE PLUS HIGH PROTEIN) liquid 237 mL  237 mL Oral BID BM Azobou Tonleu, Joelle DEL, MD   237 mL at 09/29/24 1326   Gerhardt's butt cream 1 Application  1 Application Topical BID Pokhrel, Laxman, MD   1 Application at 10/02/24 9160   insulin  aspart (novoLOG ) injection 0-6 Units  0-6 Units Subcutaneous TID WC Azobou Tonleu, Joelle DEL, MD       metoprolol  tartrate (LOPRESSOR ) tablet 50 mg  50 mg Oral BID Azobou Tonleu, Franck H, MD   50 mg at 10/02/24 9161   oxyCODONE  (Oxy IR/ROXICODONE ) immediate release tablet 5 mg  5 mg Oral Q6H PRN Azobou Tonleu, Franck H, MD   5 mg at 09/29/24 1822   pantoprazole  (PROTONIX ) EC tablet 40 mg  40 mg Oral Daily Pokhrel, Laxman, MD   40 mg at 10/02/24 9161   sodium chloride  flush (NS) 0.9 % injection 10-40 mL  10-40 mL Intracatheter Q12H Pokhrel, Laxman, MD   10 mL at 10/02/24 0840   sodium chloride  flush (NS) 0.9 % injection 10-40 mL  10-40 mL Intracatheter PRN Pokhrel, Laxman, MD       sodium chloride  flush (NS) 0.9 % injection 3 mL  3 mL Intravenous Q12H Azobou Tonleu, Joelle DEL, MD   3 mL at 09/28/24 2241   sodium chloride  flush (NS) 0.9 % injection 3 mL  3 mL Intravenous Q12H Azobou Tonleu, Joelle DEL, MD   3 mL at 10/01/24 0845   sodium chloride  flush (NS) 0.9 % injection 3 mL  3 mL Intravenous PRN Azobou Tonleu, Joelle DEL, MD       trimethobenzamide  (TIGAN ) injection 200 mg  200 mg Intramuscular Q6H PRN Azobou Donley Joelle DEL, MD   200 mg at 09/30/24 9660     Discharge Medications: Please see discharge summary for  a list of discharge medications.  Relevant Imaging Results:  Relevant Lab Results:   Additional Information SSN: [id number]. IV abx: Regimen: Ampicillin  12g IV every 24 hours continuous infusion + ceftriaxone  2g IV every 12 hours  End date: 11/07/2024  Bridget Cordella Simmonds, LCSW     "

## 2024-10-02 NOTE — Progress Notes (Signed)
 Mobility Specialist: Progress Note   10/02/24 1600  Mobility  Activity Stood with assistance  Level of Assistance Contact guard assist, steadying assist  Assistive Device Front wheel walker  Activity Response Tolerated well  Mobility Referral Yes  Mobility visit 1 Mobility  Mobility Specialist Start Time (ACUTE ONLY) [id number]  Mobility Specialist Stop Time (ACUTE ONLY) 0930  Mobility Specialist Time Calculation (min) (ACUTE ONLY) 20 min    Pt received in bed, agreeable to mobility session with a little encouragement. SV for bed mobility. BP 142/72 at EOB. Stood with CGA and took lateral step alongside EOB. Declined transferring to chair at this time. C/o a little dizziness and sat back down. BP 133/72 immediately after sitting. Returned to bed. Left in bed with all needs met, call bell in reach.   Ileana Lute Mobility Specialist Please contact via SecureChat or Rehab office at [phone]

## 2024-10-02 NOTE — Plan of Care (Signed)

## 2024-10-02 NOTE — TOC Progression Note (Signed)
 Transition of Care [facility]) - Progression Note    Patient Details  Name: [name] [name] MRN: [id number] Date of Birth: 19-Jul-1946  Transition of Care [facility]) CM/SW Contact  Bridget Cordella Simmonds, LCSW Phone Number: 10/02/2024, 12:00 PM  Clinical Narrative:   Pt referral sent out in hub for SNF.  CSW updated pt in room, brothers not here today.     Expected Discharge Plan: Skilled Nursing Facility Barriers to Discharge: Continued Medical Work up, SNF Pending bed offer               Expected Discharge Plan and Services In-house Referral: Clinical Social Work   Post Acute Care Choice: Skilled Nursing Facility Living arrangements for the past 2 months: Single Family Home                                       Social Drivers of Health (SDOH) Interventions SDOH Screenings   Food Insecurity: No Food Insecurity (09/25/2024)  Housing: Low Risk (09/25/2024)  Transportation Needs: No Transportation Needs (09/25/2024)  Utilities: Not At Risk (09/25/2024)  Social Connections: Socially Isolated (09/25/2024)  Tobacco Use: Medium Risk (09/25/2024)    Readmission Risk Interventions     No data to display

## 2024-10-02 NOTE — Progress Notes (Signed)
 Physical Therapy Treatment Patient Details Name: [name] [name] MRN: [id number] DOB: 1946-07-19 Today's Date: 10/02/2024   History of Present Illness 79 y.o. male presents 09/24/24 with c/o fever, generalized weakness, and brain fog since 09/07/24. Seen at [facility] ED found to have hypotension and AKI. Pt with c/o back pain, MRIs performed of the thoracic and lumbar spine without evidence of infection. Blood cultures now positive for Enterococcus faecalis. 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    PT Comments  Pt received in supine, agreeable to therapy session with encouragement, pt reports he does wear compression socks typically and is agreeable to PTA assisting him to don thigh-high TED hose prior to orthostatic BP assessment.  Vital Signs  Patient Position (if appropriate) Orthostatic Vitals (taken with bil thigh-high TED hose donned)  Orthostatic Lying   BP- Lying 137/63  Pulse- Lying 85  Orthostatic Sitting  BP- Sitting 138/74  Pulse- Sitting 88  Orthostatic Standing at 0 minutes  BP- Standing at 0 minutes 138/79  Pulse- Standing at 0 minutes 94  Pt without dizziness with postural changes, but with limited standing other than pre-gait sidesteps along EOB and marching exercise, as pt eager to eat his dinner which had recently arrived. Pt does not agree to sit in chair and states they never got me back the other day until many hours later but also reports he did not use call bell to request back to bed from chair. Pt agreeable to sit EOB to eat dinner after discussion, demos fair to good seated balance and BP  stable, NT notified; Bed alarm on for pt safety. Patient will benefit from continued inpatient follow up therapy, <3 hours/day.    If plan is discharge home, recommend the following: A lot of help with walking and/or transfers;A lot of help with bathing/dressing/bathroom;Assist for transportation;Help with stairs or ramp for entrance;Assistance with cooking/housework   Can travel by private vehicle     No  Equipment Recommendations  Rolling walker (2 wheels);BSC/3in1;Wheelchair (measurements PT)    Recommendations for Other Services       Precautions / Restrictions Precautions Precautions: Fall Recall of Precautions/Restrictions: Intact Precaution/Restrictions Comments: watch BP; compression socks in room help Restrictions Weight Bearing Restrictions Per Provider Order: No     Mobility  Bed Mobility Overal bed mobility: Needs Assistance Bed Mobility: Rolling, Sidelying to Sit Rolling: Modified independent (Device/Increase time), Used rails (with rails, cues for sequencing and positioning LLE with roll to R) Sidelying to sit: Mod assist, HOB elevated, Used rails       General bed mobility comments: increased time to raise trunk, pt lifting trunk halfway with minA using bed rail, but needs modA to perform rest of motion, and to assist wtih bed pad to advance his hips when scooting forward.    Transfers Overall transfer level: Needs assistance Equipment used: Rolling walker (2 wheels) Transfers: Sit to/from Stand, Bed to chair/wheelchair/BSC Sit to Stand: Mod assist, From elevated surface   Step pivot transfers: Min assist, From elevated surface       General transfer comment: modA for STS from raised bed after pt was unable to rise from mattress with CGA/minA on first attempt. Sidesteps toward [facility] and pre-gait  hip flexion with RW. Pt defers gait/OOB transfer to chair due to wanting to eat his dinner after pre-gait tasks.    Ambulation/Gait Ambulation/Gait assistance: Min  assist   Assistive device: Rolling walker (2 wheels)       Pre-gait activities: hip flexion x10 reps ea and sidesteps ~56ft to his R     Optometrist     Tilt Bed    Modified Rankin (Stroke Patients Only)       Balance Overall balance assessment: Needs assistance Sitting-balance support: Feet supported, No upper extremity supported Sitting balance-Leahy Scale: Fair     Standing balance support: Bilateral upper extremity supported Standing balance-Leahy Scale: Poor Standing balance comment: dependent on external support and RW                            Communication Communication Communication: No apparent difficulties  Cognition Arousal: Alert Behavior During Therapy: WFL for tasks assessed/performed   PT - Cognitive impairments: Memory, Attention                       PT - Cognition Comments: At times pt can self-contradict, but mostly appears aware of location/time/situation today. Pt eager to eat his dinner so limited assessment after orthostatic BP readings Following commands: Impaired Following commands impaired: Follows one step commands with increased time    Cueing Cueing Techniques: Verbal cues, Tactile cues, Gestural cues  Exercises General Exercises - Lower Extremity Hip Flexion/Marching: AROM, Left, Right, 10 reps, Standing Heel Raises:  (pt encouraged to attempt in standing but states I can't then sits back down) Other Exercises Other Exercises: STS x 2 trials for BLE strengthening Other Exercises: supine BLE AROM: ankle pumps x10 reps ea, SLR x5 reps ea    General Comments General comments (skin integrity, edema, etc.): BP stable with thigh-high TED hose donned; HR 51 bpm supine prior to TED hose, then 80's bpm wtih postural change assessment, to 90's bpm standing.      Pertinent Vitals/Pain Pain Assessment Pain Assessment: Faces Faces Pain Scale: Hurts a little bit Pain Location: not  localized Pain Descriptors / Indicators: Guarding Pain Intervention(s): Limited activity within patient's tolerance, Monitored during session, Repositioned    Home Living                          Prior Function            PT Goals (current goals can now be found in the care plan section) Acute Rehab PT Goals Patient Stated Goal: get better and back to independence PT Goal Formulation: With patient Time For Goal Achievement: 10/12/24 Progress towards PT goals: Progressing toward goals    Frequency    Min 2X/week      PT Plan      Co-evaluation              AM-PAC PT 6 Clicks Mobility   Outcome Measure  Help needed turning from your back to your side while in a flat bed without using bedrails?: A Little Help needed moving from lying on your back to sitting on the side of a flat bed without using bedrails?: A Lot Help needed moving to and from a bed to a chair (including a wheelchair)?: A Lot Help needed standing up from a chair using your arms (e.g., wheelchair or  bedside chair)?: A Lot Help needed to walk in hospital room?: Total Help needed climbing 3-5 steps with a railing? : Total 6 Click Score: 11    End of Session Equipment Utilized During Treatment: Gait belt Activity Tolerance: Patient tolerated treatment well;Other (comment) (wanting to eat so self-limiting OOB mobility) Patient left: with call bell/phone within reach;in bed;with bed alarm set;Other (comment) (sitting EOB to eat dinner, bed alarm on for pt safety, BLE TED hose donned (BP stable)) Nurse Communication: Mobility status;Other (comment) (NT arriving to room, notified pt requesting to sit EOB to eat dinner) PT Visit Diagnosis: Unsteadiness on feet (R26.81);Other abnormalities of gait and mobility (R26.89);Muscle weakness (generalized) (M62.81);Difficulty in walking, not elsewhere classified (R26.2)     Time: 1811-1840 PT Time Calculation (min) (ACUTE ONLY): 29 min  Charges:     $Therapeutic Exercise: 8-22 mins $Therapeutic Activity: 8-22 mins PT General Charges $$ ACUTE PT VISIT: 1 Visit                     Onesti Bonfiglio P., PTA Acute Rehabilitation Services Secure Chat Preferred 9a-5:30pm Office: [phone]    Connell HERO [facility] 10/02/2024, 7:17 PM

## 2024-10-03 DIAGNOSIS — A498 Other bacterial infections of unspecified site: Secondary | ICD-10-CM | POA: Diagnosis not present

## 2024-10-03 LAB — GLUCOSE, CAPILLARY
Glucose-Capillary: 95 mg/dL (ref 70–99)
Glucose-Capillary: 109 mg/dL — ABNORMAL HIGH (ref 70–99)

## 2024-10-03 MED ORDER — AMPICILLIN IV (FOR PTA / DISCHARGE USE ONLY): 12.0000 g | INTRAVENOUS | 0 refills | Status: AC

## 2024-10-03 MED ORDER — ACETAMINOPHEN 325 MG PO TABS
650.0000 mg | ORAL_TABLET | Freq: Four times a day (QID) | ORAL | Status: DC | PRN
  Administered 2024-10-03: 650 mg via ORAL
  Filled 2024-10-03: qty 2

## 2024-10-03 MED ORDER — GERHARDT'S BUTT CREAM: 1.0000 | TOPICAL_CREAM | Freq: Two times a day (BID) | CUTANEOUS | Status: AC

## 2024-10-03 MED ORDER — LOPERAMIDE HCL 2 MG PO TABS: 2.0000 mg | ORAL_TABLET | Freq: Every day | ORAL | Status: AC | PRN

## 2024-10-03 MED ORDER — CEFTRIAXONE IV (FOR PTA / DISCHARGE USE ONLY): 2.0000 g | Freq: Two times a day (BID) | INTRAVENOUS | 0 refills | Status: AC

## 2024-10-03 MED ORDER — PROBIOTIC (LACTOBACILLUS) PO CAPS: 1.0000 | ORAL_CAPSULE | Freq: Every day | ORAL | Status: AC

## 2024-10-03 MED ORDER — ENSURE PLUS HIGH PROTEIN PO LIQD: 237.0000 mL | Freq: Two times a day (BID) | ORAL | Status: AC

## 2024-10-03 NOTE — Discharge Summary (Addendum)
 "  Physician Discharge Summary  [name] [name] FMW:[id number] DOB: [birth year] DOA: 09/24/2024  PCP: [name] Louann DASEN, MD  Admit date: 09/24/2024 Discharge date: 10/03/2024  Admitted From: Home  Discharge disposition: Skilled nursing facility   Recommendations for Outpatient Follow-Up:   Follow up with your primary care provider in one week.  Check CBC, BMP, magnesium in the next visit Follow-up with infectious disease as scheduled by the clinic for antibiotic management. Cardiology will schedule an appointment for follow-up in the future.   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 Fleeta Trigt 09/2010   Status post placement of cardiac pacemaker   Type 2 diabetes mellitus without complication, without long-term current use of insulin  (HCC)   Essential hypertension   Paroxysmal atrial fibrillation (HCC)   History of complete AV block   Aortic stenosis s/p aortic valve placement   History of CAD (coronary artery disease)   Non-insulin  dependent type 2 diabetes mellitus (HCC)   Hypokalemia   Prosthetic valve endocarditis   Pacemaker infection   Discharge Condition: Improved.  Diet recommendation:   Carbohydrate-modified.    Wound care: None.  Code status: Full.   History of Present Illness:   Patient is a 79 yr old male with BPH, hypertension, hyperlipidemia, diabetes mellitus type 2, CAD, paroxysmal atrial fibrillation, valvular heart disease status post AV block status post dual-chamber pacemaker in November 2011 and generator change in 2022, diabetes mellitus presented to the hospital for fever of 2 weeks with generalized weakness and brain fog.  In the ED, patient was hemodynamically stable.  Initial WBC was 13 with sodium of 132.  Lipase elevated at 148 but lactate was normal.  Patient was then started on Zosyn  and vancomycin .  ID was  consulted and 2D echocardiogram, MRI of the T and L spine was performed.  MRI did not show evidence of lumbar spine infection but diffuse disc and facet degeneration without high-grade spinal stenosis, there is also moderate to severe right and moderate left neural foraminal stenosis at L5-S1. There is also mild to moderal lateral recess stenosis at L3-L4.  Patient was then admitted hospital for further evaluation and treatment.  Hospital Course:   Following conditions were addressed during hospitalization as listed below,  Enterococcus faecalis Bacteremia with septic emboli to the brain CBC showed leukocytosis.SABRA  MRI of the lumbar spine and MRI of the thoracic spine has ruled out discitis/osteomyelitis.  2D echocardiogram showed bacteremia. No vegetations were seen on TTE.  TEE was performed on 09/29/2024 with thickening suggestive of prosthetic aortic valve endocarditis.  Patient also has pacemaker in place.  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  Infectious disease , cardiothoracic surgery and electrophysiology cardiology on board.  Electrophysiology cardiology with an impression that device extraction would not be beneficial.  Cardiothoracic surgery with an impression that patient is not a candidate for redo AVR or possible root replacement with multiple embolic strokes, significant MV disease and ascending aortic aneurysm.  Patient is currently on ampicillin  and ceftriaxone .  Plan is to continue IV antibiotics for 6 weeks.  Patient already has PICC line in place.  6 diseases to follow the patient after discharge.   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   Had brain fog initially with a distant history of slurred speech.  Incidentally noted.  No focal deficits at this time.  Neurology was consulted and  CTA  of the head and neck done.  Recommendation is to continue Eliquis  on discharge.  Patient will continue IV antibiotics.   Chronic back pain with sciatica  MRI lumbar and thoracic spine have  ruled out infectious process in the spine. 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  Continue supportive care.   Hypokalemia Replenished.    History of aortic stenosis s/p bioprosthesis aortic valve placement No vegetations seen on TTE.  Status post TEE  09/29/2024 there was concern for infective endocarditis.  Electrophysiology cardiology surgery does not recommend pacer extraction.  Communicated with electrophysiology cardiology prior to discharge and the team will be followed up as outpatient.   History of paroxysmal atrial fibrillation History of AV block status post pacemaker Multiple embolic strokes EKG showed PVCs and A-fib.  Was on Eliquis  and metoprolol .  Resumed   Chronic diastolic heart failure History of essential hypertension On metoprolol , amlodipine .   History of CAD No acute disease.  Continue statin and Lopressor .  Eliquis  on hold at this time.   Insulin -dependent DM type II Continue Jardiance , sliding scale insulin .  Closely monitor.  Last POC glucose of 90   Disposition.  At this time, patient is stable for disposition to skilled nursing facility with outpatient ID and cardiology follow-up  Medical Consultants:   Cardiology Infectious disease Electrophysiology cardiology CT surgery Neurology   Procedures:    TEE on 09/29/2024  Subjective:   Today, patient feels okay.  Feels little bloated.  Denies any fever chills or shortness of breath.  Discharge Exam:   Vitals:   10/03/24 0529 10/03/24 0803  BP: (!) 158/74 (!) 155/78  Pulse: 86 91  Resp: 18 16  Temp: (!) 97.5 F (36.4 C) 98 F (36.7 C)  SpO2: 99% 95%   Vitals:   10/02/24 1501 10/02/24 2003 10/03/24 0529 10/03/24 0803  BP: 132/64 124/62 (!) 158/74 (!) 155/78  Pulse: 82 93 86 91  Resp: 18 18 18 16   Temp:  98.2 F (36.8 C) (!) 97.5 F (36.4 C) 98 F (36.7 C)  TempSrc:  Oral Oral   SpO2: 96% 98% 99% 95%  Weight:      Height:        General:  Average built, not in obvious  distress, elderly male, average built, Communicative. HENT:   No scleral pallor or icterus noted. Oral mucosa is moist.  Chest:  Clear breath sounds.  Diminished breath sounds bilaterally. No crackles or wheezes.  Chest wall pacer in place. CVS: S1 &S2 heard. No murmur.  Regular rate and rhythm. Abdomen: Soft, nontender, nondistended.  Bowel sounds are heard.  External urinary catheter in place. Extremities: No cyanosis, clubbing or edema.  Peripheral pulses are palpable.  Right upper extremity PICC line in place Psych: Alert, awake and oriented, normal mood CNS:  No cranial nerve deficits.  Power equal in all extremities.   Skin: Warm and dry.  No rashes noted.  The results of significant diagnostics from this hospitalization (including imaging, microbiology, ancillary and laboratory) are listed below for reference.     Diagnostic Studies:   MR THORACIC SPINE W WO CONTRAST Result Date: 09/25/2024 EXAM: MRI THORACIC SPINE WITH AND WITHOUT INTRAVENOUS CONTRAST 09/25/2024 01:10:16 PM TECHNIQUE: Multiplanar multisequence MRI of the thoracic spine was performed with and without the administration of intravenous contrast. COMPARISON: None available. CLINICAL HISTORY: Fever, back pain, and lower extremity weakness. FINDINGS: BONES AND ALIGNMENT: Trace anterolisthesis of C7 on T1 and T1 on T2. Hemangioma in the T11 vertebral body. No  fracture, suspicious marrow lesion, significant marrow edema, or evidence of discitis. Mild scattered multilevel chronic degenerative endplate changes and small Schmorl nodes. SPINAL CORD: Normal spinal cord signal and morphology. No abnormal intradural enhancement. No epidural collection. SOFT TISSUES: Small pleural effusions, right larger than left. No paraspinal collection. DEGENERATIVE CHANGES: Mild diffuse thoracic disc degeneration and predominantly mild facet arthrosis without a sizable disc herniation, spinal stenosis, or compressive neural foraminal stenosis.  Incompletely evaluated lower cervical disc degeneration. IMPRESSION: 1. No evidence of thoracic spine infection. 2. Mild diffuse thoracic disc degeneration without significant stenosis. 3. Small pleural effusions. Electronically signed by: Dasie Hamburg MD 09/25/2024 02:39 PM EST RP Workstation: [id number]   MR Lumbar Spine W Wo Contrast Result Date: 09/25/2024 EXAM: MRI LUMBAR SPINE 09/25/2024 01:10:16 PM TECHNIQUE: Multiplanar multisequence MRI of the lumbar spine was performed with and without the administration of intravenous contrast. 10 mL (gadobutrol  (GADAVIST ) 1 MMOL/ML injection 10 mL GADOBUTROL  1 MMOL/ML IV SOLN) was administered. COMPARISON: CT lumbar spine 09/24/2024. CLINICAL HISTORY: Low back pain, fever, lower extremity weakness. FINDINGS: BONES AND ALIGNMENT: 5 lumbar type vertebrae. Trace retrolisthesis of L1 on L2. No fracture, suspicious marrow lesion, or evidence of discitis. Mild multilevel Modic type 2 degenerative endplate changes as well as minimal edema/Modic type 1 changes along the L4 inferior endplate. Schmorl's nodes involving the L2 superior and inferior endplates. SPINAL CORD: The conus medullaris terminates at L1 and is normal in signal. No epidural collection. SOFT TISSUES: Postoperative changes in the posterior lower lumbar soft tissues. No paraspinal collection. DISC LEVELS: Disc desiccation throughout the lumbar spine with mild associated disc space narrowing. T12-L1: Negative. L1-L2: Disc bulging and mild facet and ligamentum flavum hypertrophy without stenosis. L2-L3: Disc bulging and moderate facet and ligamentum flavum hypertrophy result in mild bilateral lateral recess stenosis without significant spinal stenosis or neural foraminal stenosis. L3-L4: Disc bulging mildly eccentric to the right and moderate facet and ligamentum flavum hypertrophy result in mild to moderate right and mild left lateral recess stenosis and mild right neural foraminal stenosis without significant  spinal stenosis. L4-L5: Previous right laminectomy. Disc bulging, endplate spurring, and moderate facet hypertrophy result in mild bilateral lateral recess stenosis and mild bilateral neural foraminal stenosis without significant spinal stenosis. L5-S1: Disc bulging, endplate spurring, and moderate facet hypertrophy result in moderate to severe right and moderate left neural foraminal stenosis without spinal stenosis. IMPRESSION: 1. No evidence of lumbar spine infection. 2. Diffuse disc and facet degeneration without high-grade spinal stenosis. 3. Moderate to severe right and moderate left neural foraminal stenosis at L5-S1. 4. Mild to moderate lateral recess stenosis at L3-L4. Electronically signed by: Dasie Hamburg MD 09/25/2024 02:33 PM EST RP Workstation: [id number]   ECHOCARDIOGRAM COMPLETE Result Date: 09/25/2024    ECHOCARDIOGRAM REPORT   Patient Name:   ISIAC BREIGHNER Date of Exam: 09/25/2024 Medical Rec #:  [id number]      Height:       71.0 in Accession #:    [id number]     Weight:       194.0 lb Date of Birth:  1945-11-16       BSA:          2.081 m Patient Age:    78 years       BP:           132/70 mmHg Patient Gender: M              HR:           63 bpm.  Exam Location:  Inpatient Procedure: 2D Echo, Color Doppler, Cardiac Doppler and Intracardiac            Opacification Agent (Both Spectral and Color Flow Doppler were            utilized during procedure). Indications:    Fever R50.9  History:        Patient has prior history of Echocardiogram examinations, most                 recent 06/19/2024. Risk Factors:Diabetes and Hypertension.                 Aortic Valve: 21 mm Magna Ease bioprosthetic valve is present in                 the aortic position. Procedure Date: 10/06/10.  Sonographer:    Tinnie Gosling RDCS Referring Phys: [phone] SUBRINA SUNDIL IMPRESSIONS  1. Left ventricular ejection fraction, by estimation, is 60 to 65%. The left ventricle has normal function. The left ventricle has no regional  wall motion abnormalities. Left ventricular diastolic parameters are indeterminate. Elevated left atrial pressure.  2. Right ventricular systolic function is normal. The right ventricular size is normal.  3. The mitral valve is normal in structure. Trivial mitral valve regurgitation. No evidence of mitral stenosis. Severe mitral annular calcification.  4. The aortic valve has been repaired/replaced. Aortic valve regurgitation is mild. No aortic stenosis is present. There is a 21 mm Magna Ease bioprosthetic valve present in the aortic position. Procedure Date: 10/06/10.  5. The inferior vena cava is dilated in size with >50% respiratory variability, suggesting right atrial pressure of 8 mmHg. FINDINGS  Left Ventricle: Left ventricular ejection fraction, by estimation, is 60 to 65%. The left ventricle has normal function. The left ventricle has no regional wall motion abnormalities. Definity  contrast agent was given IV to delineate the left ventricular  endocardial borders. The left ventricular internal cavity size was normal in size. There is no left ventricular hypertrophy. Left ventricular diastolic parameters are indeterminate. Elevated left atrial pressure. Right Ventricle: The right ventricular size is normal. Right ventricular systolic function is normal. Left Atrium: Left atrial size was normal in size. Right Atrium: Right atrial size was normal in size. Pericardium: There is no evidence of pericardial effusion. Mitral Valve: The mitral valve is normal in structure. Severe mitral annular calcification. Trivial mitral valve regurgitation. No evidence of mitral valve stenosis. Tricuspid Valve: The tricuspid valve is normal in structure. Tricuspid valve regurgitation is trivial. No evidence of tricuspid stenosis. Aortic Valve: The aortic valve has been repaired/replaced. Aortic valve regurgitation is mild. No aortic stenosis is present. Aortic valve mean gradient measures 10.0 mmHg. Aortic valve peak gradient  measures 15.7 mmHg. Aortic valve area, by VTI measures  1.31 cm. There is a 21 mm Magna Ease bioprosthetic valve present in the aortic position. Procedure Date: 10/06/10. Pulmonic Valve: The pulmonic valve was normal in structure. Pulmonic valve regurgitation is trivial. No evidence of pulmonic stenosis. Aorta: The aortic root is normal in size and structure. Venous: The inferior vena cava is dilated in size with greater than 50% respiratory variability, suggesting right atrial pressure of 8 mmHg. IAS/Shunts: No atrial level shunt detected by color flow Doppler. Additional Comments: A device lead is visualized.  LEFT VENTRICLE PLAX 2D LVIDd:         5.10 cm     Diastology LVIDs:         2.90 cm     LV  e' medial:    4.90 cm/s LV PW:         1.10 cm     LV E/e' medial:  33.3 LV IVS:        1.10 cm     LV e' lateral:   8.92 cm/s LVOT diam:     1.73 cm     LV E/e' lateral: 18.3 LV SV:         55 LV SV Index:   26 LVOT Area:     2.35 cm  LV Volumes (MOD) LV vol d, MOD A4C: 95.7 ml LV vol s, MOD A4C: 37.2 ml LV SV MOD A4C:     95.7 ml RIGHT VENTRICLE         IVC TAPSE (M-mode): 1.5 cm  IVC diam: 2.33 cm LEFT ATRIUM           Index LA diam:      4.99 cm 2.40 cm/m LA Vol (A2C): 52.7 ml 25.32 ml/m LA Vol (A4C): 60.0 ml 28.83 ml/m  AORTIC VALVE AV Area (Vmax):    1.50 cm AV Area (Vmean):   1.47 cm AV Area (VTI):     1.31 cm AV Vmax:           198.00 cm/s AV Vmean:          150.000 cm/s AV VTI:            0.416 m AV Peak Grad:      15.7 mmHg AV Mean Grad:      10.0 mmHg LVOT Vmax:         126.00 cm/s LVOT Vmean:        93.500 cm/s LVOT VTI:          0.232 m LVOT/AV VTI ratio: 0.56  AORTA Ao Root diam: 3.27 cm Ao Asc diam:  3.89 cm MITRAL VALVE MV Area (PHT): 2.94 cm     SHUNTS MV Decel Time: 258 msec     Systemic VTI:  0.23 m MV E velocity: 163.00 cm/s  Systemic Diam: 1.73 cm MV A velocity: 158.00 cm/s MV E/A ratio:  1.03 Redell Shallow MD Electronically signed by Redell Shallow MD Signature Date/Time:  09/25/2024/12:46:05 PM    Final    CT L-SPINE NO CHARGE Result Date: 09/24/2024 CLINICAL DATA:  Bilateral flank pain EXAM: CT LUMBAR SPINE WITHOUT CONTRAST TECHNIQUE: Multidetector CT imaging of the lumbar spine was performed without intravenous contrast administration. Multiplanar CT image reconstructions were also generated. RADIATION DOSE REDUCTION: This exam was performed according to the departmental dose-optimization program which includes automated exposure control, adjustment of the mA and/or kV according to patient size and/or use of iterative reconstruction technique. COMPARISON:  None Available. FINDINGS: Segmentation: 5 lumbar type vertebrae. Alignment: Normal. Vertebrae: No acute fracture or focal pathologic process. Paraspinal and other soft tissues: The paraspinal soft tissues are unremarkable. Atherosclerosis of the aorta and its branches. Trace bilateral pleural effusions. Disc levels: Findings at individual levels are as follows: L1/L2: Minimal broad-based disc bulge without compressive sequela. L2/L3: Mild circumferential disc bulge and bilateral facet hypertrophy. No significant compressive sequela. L3/L4: Mild circumferential disc bulge with bilateral facet and ligamentum flavum hypertrophy. Mild central canal stenosis. L4/L5: Circumferential disc bulge and bilateral facet hypertrophy contribute to mild symmetrical bilateral neural foraminal encroachment. Evidence of prior right-sided laminotomy. L5/S1: Significant bilateral facet hypertrophy contributes to symmetrical bilateral neural foraminal encroachment. Reconstructed images demonstrate no additional findings. IMPRESSION: 1. No acute lumbar spine fracture. 2. Multilevel lumbar spondylosis and facet  hypertrophy, with mild central canal stenosis at L3-4 and significant bilateral neural foraminal encroachment at L5-S1. 3.  Aortic Atherosclerosis (ICD10-I70.0). 4. Trace bilateral pleural effusions. Electronically Signed   By: Ozell Daring  M.D.   On: 09/24/2024 21:56   CT ABDOMEN PELVIS W CONTRAST Result Date: 09/24/2024 CLINICAL DATA:  Bilateral flank pain EXAM: CT ABDOMEN AND PELVIS WITH CONTRAST TECHNIQUE: Multidetector CT imaging of the abdomen and pelvis was performed using the standard protocol following bolus administration of intravenous contrast. RADIATION DOSE REDUCTION: This exam was performed according to the departmental dose-optimization program which includes automated exposure control, adjustment of the mA and/or kV according to patient size and/or use of iterative reconstruction technique. CONTRAST:  75mL OMNIPAQUE  IOHEXOL  350 MG/ML SOLN COMPARISON:  08/26/2021 FINDINGS: Lower chest: Trace bilateral pleural effusions. No acute airspace disease. Postsurgical changes from aortic valve replacement and dual lead pacemaker. Dense calcification of the mitral annulus. Hepatobiliary: No focal liver abnormality is seen. No gallstones, gallbladder wall thickening, or biliary dilatation. Pancreas: Unremarkable. No pancreatic ductal dilatation or surrounding inflammatory changes. Spleen: Normal in size without focal abnormality. Adrenals/Urinary Tract: There are 2 right renal hypodensities. Within the upper pole a 2 cm hypodensity measures 45 HU on the nephrographic phase of the exam, and 35 HU on the delayed phase. Within the lower pole there is a 1.4 cm hypodensity measuring 33 HU on the nephrographic phase and 34 HU on the delayed phase. These are indeterminate, but likely reflect hyperdense cyst. Further evaluation with nonemergent renal ultrasound could be performed. No urinary tract calculi or obstructive uropathy. The adrenals and bladder are unremarkable. Stomach/Bowel: No bowel obstruction or ileus. There is significant fecal retention within the descending and sigmoid colon. Normal appendix right mid abdomen. No bowel wall thickening or inflammatory change. Vascular/Lymphatic: Aortic atherosclerosis. No enlarged abdominal or pelvic  lymph nodes. Reproductive: Brachytherapy seeds are seen within the prostate bed. Other: No free fluid or free intraperitoneal gas. No abdominal wall hernia. Musculoskeletal: No acute or destructive bony abnormalities. Bilateral hip osteoarthritis, left greater than right. Reconstructed images demonstrate no additional findings. IMPRESSION: 1. No acute intra-abdominal or intrapelvic process. 2. Moderate stool within the distal colon consistent with constipation. No bowel obstruction or ileus. 3. Trace bilateral pleural effusions, right greater than left. 4. Indeterminate right renal hypodensities, favor hyperdense cysts. Nonemergent outpatient renal ultrasound may be useful for further characterization. 5.  Aortic Atherosclerosis (ICD10-I70.0). Electronically Signed   By: Ozell Daring M.D.   On: 09/24/2024 21:53   DG Chest 1 View Result Date: 09/24/2024 EXAM: 1 VIEW(S) XRAY OF THE CHEST 09/24/2024 08:35:58 PM COMPARISON: 10/31/2010 CLINICAL HISTORY: Weakness, fever. FINDINGS: LINES, TUBES AND DEVICES: Left chest dual-chamber pacemaker with leads projecting over right atrium and ventricle. LUNGS AND PLEURA: No focal pulmonary opacity. No pleural effusion. No pneumothorax. HEART AND MEDIASTINUM: Aortic arch calcifications. BONES AND SOFT TISSUES: Median sternotomy noted. IMPRESSION: 1. No acute cardiopulmonary abnormality. Electronically signed by: Kate Plummer MD 09/24/2024 08:40 PM EST RP Workstation: [id number]     Labs:   Basic Metabolic Panel: Recent Labs  Lab 09/27/24 0428 09/28/24 0439 09/29/24 0516 09/30/24 0442 10/01/24 0528  NA 132* 135 133* 135 137  K 4.4 3.9 3.0* 3.1* 3.3*  CL 98 99 97* 99 100  CO2 22 21* 20* 20* 21*  GLUCOSE 83 86 107* 144* 95  BUN 13 13 12 19 18   CREATININE 0.67 0.55* 0.50* 0.50* 0.51*  CALCIUM  8.0* 8.1* 7.9* 7.8* 7.8*  PHOS  --   --   --  3.4  --    GFR Estimated Creatinine Clearance: 79.7 mL/min (A) (by C-G formula based on SCr of 0.51 mg/dL (L)). Liver  Function Tests: No results for input(s): AST, ALT, ALKPHOS, BILITOT, PROT, ALBUMIN in the last 168 hours. No results for input(s): LIPASE, AMYLASE in the last 168 hours. No results for input(s): AMMONIA in the last 168 hours. Coagulation profile No results for input(s): INR, PROTIME in the last 168 hours.  CBC: Recent Labs  Lab 09/27/24 0428 09/28/24 0439 09/29/24 0516 09/30/24 0442  WBC 13.6* 13.4* 16.3* 18.0*  NEUTROABS 10.7* 10.4* 13.7*  --   HGB 12.6* 13.7 13.9 13.7  HCT 36.6* 40.5 40.1 39.1  MCV 89.7 91.2 89.7 89.7  PLT 203 194 223 252   Cardiac Enzymes: No results for input(s): CKTOTAL, CKMB, CKMBINDEX, TROPONINI in the last 168 hours. BNP: Invalid input(s): POCBNP CBG: Recent Labs  Lab 10/02/24 0624 10/02/24 1113 10/02/24 1641 10/02/24 2144 10/03/24 0530  GLUCAP 108* 129* 105* 128* 95   D-Dimer No results for input(s): DDIMER in the last 72 hours. Hgb A1c No results for input(s): HGBA1C in the last 72 hours. Lipid Profile Recent Labs    10/01/24 0528  CHOL 87  HDL 16*  LDLCALC 47  TRIG 877  CHOLHDL 5.4   Thyroid function studies No results for input(s): TSH, T4TOTAL, T3FREE, THYROIDAB in the last 72 hours.  Invalid input(s): FREET3 Anemia work up No results for input(s): VITAMINB12, FOLATE, FERRITIN, TIBC, IRON, RETICCTPCT in the last 72 hours. Microbiology Recent Results (from the past 240 hours)  Resp panel by RT-PCR (RSV, Flu A&B, Covid) Anterior Nasal Swab     Status: None   Collection Time: 09/24/24 11:10 PM   Specimen: Anterior Nasal Swab  Result Value Ref Range Status   SARS Coronavirus 2 by RT PCR NEGATIVE NEGATIVE Final   Influenza A by PCR NEGATIVE NEGATIVE Final   Influenza B by PCR NEGATIVE NEGATIVE Final    Comment: (NOTE) The Xpert Xpress SARS-CoV-2/FLU/RSV plus assay is intended as an aid in the diagnosis of influenza from Nasopharyngeal swab specimens and should not be  used as a sole basis for treatment. Nasal washings and aspirates are unacceptable for Xpert Xpress SARS-CoV-2/FLU/RSV testing.  Fact Sheet for Patients: bloggercourse.com  Fact Sheet for Healthcare Providers: seriousbroker.it  This test is not yet approved or cleared by the [place]  FDA and has been authorized for detection and/or diagnosis of SARS-CoV-2 by FDA under an Emergency Use Authorization (EUA). This EUA will remain in effect (meaning this test can be used) for the duration of the COVID-19 declaration under Section 564(b)(1) of the Act, 21 U.S.C. section 360bbb-3(b)(1), unless the authorization is terminated or revoked.     Resp Syncytial Virus by PCR NEGATIVE NEGATIVE Final    Comment: (NOTE) Fact Sheet for Patients: bloggercourse.com  Fact Sheet for Healthcare Providers: seriousbroker.it  This test is not yet approved or cleared by the [place] States  FDA and has been authorized for detection and/or diagnosis of SARS-CoV-2 by FDA under an Emergency Use Authorization (EUA). This EUA will remain in effect (meaning this test can be used) for the duration of the COVID-19 declaration under Section 564(b)(1) of the Act, 21 U.S.C. section 360bbb-3(b)(1), unless the authorization is terminated or revoked.  Performed at [facility], [address] [place], [place] [address]   Blood culture (routine x 2)     Status: Abnormal   Collection Time: 09/25/24  2:20 AM   Specimen: BLOOD  Result Value Ref Range Status   Specimen Description BLOOD SITE NOT SPECIFIED  Final   Special Requests   Final    BOTTLES DRAWN AEROBIC AND ANAEROBIC Blood Culture adequate volume   Culture  Setup Time   Final    GRAM POSITIVE COCCI IN BOTH AEROBIC AND ANAEROBIC BOTTLES CRITICAL RESULT CALLED TO, READ BACK BY AND VERIFIED WITH: PHARMD C. AMEND [phone] @ [phone] Performed at [facility], [address] [place], [place] [address]    Culture ENTEROCOCCUS FAECALIS (A)  Final   Report Status 09/27/2024 FINAL  Final   Organism ID, Bacteria ENTEROCOCCUS FAECALIS  Final      Susceptibility   Enterococcus faecalis - MIC*    AMPICILLIN  <=2 SENSITIVE Sensitive     VANCOMYCIN  1 SENSITIVE Sensitive     GENTAMICIN  SYNERGY SENSITIVE Sensitive     * ENTEROCOCCUS FAECALIS  Blood Culture ID Panel (Reflexed)     Status: Abnormal   Collection Time: 09/25/24  2:20 AM  Result Value Ref Range Status   Enterococcus faecalis DETECTED (A) NOT DETECTED Final    Comment: CRITICAL RESULT CALLED TO, READ BACK BY AND VERIFIED WITH: PHARMD C. AMEND [phone] @ 1638 FH    Enterococcus Faecium NOT DETECTED NOT DETECTED Final   Listeria monocytogenes NOT DETECTED NOT DETECTED Final   Staphylococcus species NOT DETECTED NOT DETECTED Final   Staphylococcus aureus (BCID) NOT DETECTED NOT DETECTED Final   Staphylococcus epidermidis NOT DETECTED NOT DETECTED Final   Staphylococcus lugdunensis NOT DETECTED NOT DETECTED Final   Streptococcus species NOT DETECTED NOT DETECTED Final   Streptococcus agalactiae NOT DETECTED NOT DETECTED Final   Streptococcus pneumoniae NOT DETECTED NOT DETECTED Final   Streptococcus pyogenes NOT DETECTED NOT DETECTED Final   A.calcoaceticus-baumannii NOT DETECTED NOT DETECTED Final   Bacteroides fragilis NOT DETECTED NOT DETECTED Final   Enterobacterales NOT DETECTED NOT DETECTED Final   Enterobacter cloacae complex NOT DETECTED NOT DETECTED Final   Escherichia coli NOT DETECTED NOT DETECTED Final   Klebsiella aerogenes NOT DETECTED NOT DETECTED Final   Klebsiella oxytoca NOT DETECTED NOT DETECTED Final   Klebsiella pneumoniae NOT DETECTED NOT DETECTED Final   Proteus species NOT DETECTED NOT DETECTED Final   Salmonella species NOT DETECTED NOT DETECTED Final   Serratia marcescens NOT DETECTED NOT DETECTED Final   Haemophilus influenzae NOT DETECTED NOT DETECTED  Final   Neisseria meningitidis NOT DETECTED NOT DETECTED Final   Pseudomonas aeruginosa NOT DETECTED NOT DETECTED Final   Stenotrophomonas maltophilia NOT DETECTED NOT DETECTED Final   Candida albicans NOT DETECTED NOT DETECTED Final   Candida auris NOT DETECTED NOT DETECTED Final   Candida glabrata NOT DETECTED NOT DETECTED Final   Candida krusei NOT DETECTED NOT DETECTED Final   Candida parapsilosis NOT DETECTED NOT DETECTED Final   Candida tropicalis NOT DETECTED NOT DETECTED Final   Cryptococcus neoformans/gattii NOT DETECTED NOT DETECTED Final   Vancomycin  resistance NOT DETECTED NOT DETECTED Final    Comment: Performed at [facility], [address] [place], [place] [address]  Blood culture (routine x 2)     Status: None   Collection Time: 09/25/24  7:02 PM   Specimen: BLOOD LEFT HAND  Result Value Ref Range Status   Specimen Description BLOOD LEFT HAND  Final   Special Requests   Final    BOTTLES DRAWN AEROBIC AND ANAEROBIC Blood Culture adequate volume   Culture   Final    NO GROWTH 5 DAYS Performed  at [facility], [address] [place], [place] [address]    Report Status 09/30/2024 FINAL  Final  Culture, blood (Routine X 2) w Reflex to ID Panel     Status: None   Collection Time: 09/26/24 10:30 AM   Specimen: BLOOD RIGHT ARM  Result Value Ref Range Status   Specimen Description BLOOD RIGHT ARM  Final   Special Requests   Final    BOTTLES DRAWN AEROBIC AND ANAEROBIC Blood Culture adequate volume   Culture   Final    NO GROWTH 5 DAYS Performed at [facility], [address] [place], [place] [address]    Report Status 10/01/2024 FINAL  Final  Culture, blood (Routine X 2) w Reflex to ID Panel     Status: None   Collection Time: 09/26/24 10:41 AM   Specimen: BLOOD  Result Value Ref Range Status   Specimen Description BLOOD BLOOD RIGHT HAND  Final   Special Requests   Final    BOTTLES DRAWN AEROBIC AND ANAEROBIC Blood Culture adequate volume    Culture   Final    NO GROWTH 5 DAYS Performed at [facility], [address] [place], [place] [address]    Report Status 10/01/2024 FINAL  Final     Discharge Instructions:   Discharge Instructions     [facility] pharmacist to adjust dose for Vancomycin , Aminoglycosides and other anti-infective therapies as requested by physician.   Complete by: As directed    [facility] to provide Cath Flo 2mg    Complete by: As directed    Administer for PICC line occlusion and as ordered by physician for other access device issues.   Anaphylaxis Kit: Provided to treat any anaphylactic reaction to the medication being provided to the patient if First Dose or when requested by physician   Complete by: As directed    Epinephrine  1mg /ml vial / amp: Administer 0.3mg  (0.26ml) subcutaneously once for moderate to severe anaphylaxis, nurse to call physician and pharmacy when reaction occurs and call 911 if needed for immediate care   Diphenhydramine  50mg /ml IV vial: Administer 25-50mg  IV/IM PRN for first dose reaction, rash, itching, mild reaction, nurse to call physician and pharmacy when reaction occurs   Sodium Chloride  0.9% NS 500ml IV: Administer if needed for hypovolemic blood pressure drop or as ordered by physician after call to physician with anaphylactic reaction   Call MD for:  temperature >100.4   Complete by: As directed    Change dressing on IV access line weekly and PRN   Complete by: As directed    Diet Carb Modified   Complete by: As directed    Discharge instructions   Complete by: As directed    Follow-up with your primary care provider at the skilled nursing facility in 3 to 5 days.  Follow-up with infectious disease in the clinic for antibiotic management as scheduled by the clinic.  Seek medical attention for worsening symptoms.  Complete the course of antibiotic.  Follow-up with cardiology as scheduled by the clinic   Flush IV access with Sodium Chloride   0.9% and Heparin 10 units/ml or 100 units/ml   Complete by: As directed    Home infusion instructions - [facility]   Complete by: As directed    Instructions: Flush IV access with Sodium Chloride  0.9% and Heparin 10units/ml or 100units/ml   Change dressing on IV access line: Weekly and PRN   Instructions Cath Flo 2mg : Administer for  PICC Line occlusion and as ordered by physician for other access device   [facility] pharmacist to adjust dose for: Vancomycin , Aminoglycosides and other anti-infective therapies as requested by physician   Increase activity slowly   Complete by: As directed    Method of administration may be changed at the discretion of home infusion pharmacist based upon assessment of the patient and/or caregivers ability to self-administer the medication ordered   Complete by: As directed    No wound care   Complete by: As directed       Allergies as of 10/03/2024   No Known Allergies      Medication List     TAKE these medications    amLODipine  5 MG tablet Commonly known as: NORVASC  Take 1 tablet (5 mg total) by mouth daily.   ampicillin  IVPB Inject 12 g into the vein daily. As a continuous infusion. Indication:  E faecalis endocarditis  First Dose: Yes Last Day of Therapy:  11/07/24 Labs - Once weekly:  CBC/D and BMP, Labs - Once weekly: ESR and CRP Method of administration: Ambulatory Pump (Continuous Infusion) Method of administration may be changed at the discretion of home infusion pharmacist based upon assessment of the patient and/or caregiver's ability to self-administer the medication ordered.   atorvastatin  40 MG tablet Commonly known as: LIPITOR Take 1 tablet by mouth daily.   cefTRIAXone  IVPB Commonly known as: ROCEPHIN  Inject 2 g into the vein every 12 (twelve) hours. Indication:  E faecalis endocarditis  First Dose: Yes Last Day of Therapy:  11/07/24 Labs - Once weekly:  CBC/D and BMP, Labs - Once weekly: ESR and  CRP Method of administration: IV Push Method of administration may be changed at the discretion of home infusion pharmacist based upon assessment of the patient and/or caregiver's ability to self-administer the medication ordered.   Eliquis  5 MG Tabs tablet Generic drug: apixaban  TAKE 1 TABLET TWICE A DAY   empagliflozin  25 MG Tabs tablet Commonly known as: JARDIANCE  Take 12.5 mg by mouth daily.   feeding supplement Liqd Take 237 mLs by mouth 2 (two) times daily between meals.   Gerhardt's butt cream Crea Apply 1 Application topically 2 (two) times daily.   loperamide  2 MG tablet Commonly known as: IMODIUM  A-D Take 1 tablet (2 mg total) by mouth daily as needed for diarrhea or loose stools.   metFORMIN 500 MG tablet Commonly known as: GLUCOPHAGE Take 0.5 tablets by mouth 2 (two) times daily.   metoprolol  tartrate 50 MG tablet Commonly known as: LOPRESSOR  TAKE 1 TABLET TWICE A DAY   PreserVision AREDS 2 Caps Take 1 capsule by mouth in the morning and at bedtime.   Probiotic (Lactobacillus) Caps Take 1 capsule by mouth at bedtime.   telmisartan 80 MG tablet Commonly known as: MICARDIS Take 80 mg by mouth daily. for high blood pressure               Discharge Care Instructions  (From admission, onward)           Start     Ordered   10/03/24 0000  Change dressing on IV access line weekly and PRN  (Home infusion instructions - [facility] )        10/03/24 0913            Follow-up Information     [name] Louann DASEN, MD Follow up.   Specialty: Internal Medicine Contact information: [address] [place] [address] [phone]  [facility] Of [place]. [facility] Follow up on 10/27/2024.   Specialty: Infectious Diseases Why: 10/27/2024 @ 1:30 pm with Dr. Fleeta Rothman (can be virtual if easier)  11/06/2024 @ 11:15 am with Greg Calone for conversion to oral dose Contact  information: [address] [place], [address] [place]  [address] [phone]                 Time coordinating discharge: 39 minutes  Signed:  Carolyn Maniscalco  [facility] 10/03/2024, 9:27 AM          "

## 2024-10-03 NOTE — TOC Progression Note (Signed)
 Transition of Care [facility]) - Progression Note    Patient Details  Name: [name] [name] MRN: [id number] Date of Birth: [birth year]  [facility] CM/SW Contact  Bridget Cordella Simmonds, LCSW Phone Number: 10/03/2024, 12:51 PM  Clinical Narrative:   CSW received message from [facility]: no current beds.  CSW spoke with pt, brother and niece are on their way.  1200: CSW spoke with pt, both brothers, niece regarding SNF bed offers.  [facility] has also offered.  After some discussion, they will accept offer at [facility].  CSW confirmed with [facility]: they can receive pt today, IV abx in place.      Expected Discharge Plan: Skilled Nursing Facility Barriers to Discharge: Continued Medical Work up, SNF Pending bed offer               Expected Discharge Plan and Services In-house Referral: Clinical Social Work   Post Acute Care Choice: Skilled Nursing Facility Living arrangements for the past 2 months: Single Family Home Expected Discharge Date: 10/03/24                                     Social Drivers of Health (SDOH) Interventions SDOH Screenings   Food Insecurity: No Food Insecurity (09/25/2024)  Housing: Low Risk (09/25/2024)  Transportation Needs: No Transportation Needs (09/25/2024)  Utilities: Not At Risk (09/25/2024)  Social Connections: Socially Isolated (09/25/2024)  Tobacco Use: Medium Risk (09/25/2024)    Readmission Risk Interventions     No data to display

## 2024-10-03 NOTE — Plan of Care (Signed)
  Problem: Education: Goal: Knowledge of disease or condition will improve Outcome: Progressing   Problem: Coping: Goal: Will verbalize positive feelings about self Outcome: Progressing   Problem: Nutrition: Goal: Risk of aspiration will decrease Outcome: Progressing

## 2024-10-03 NOTE — Plan of Care (Signed)
  Problem: Clinical Measurements: Goal: Respiratory complications will improve Outcome: Progressing   Problem: Clinical Measurements: Goal: Cardiovascular complication will be avoided Outcome: Progressing   Problem: Nutrition: Goal: Adequate nutrition will be maintained Outcome: Progressing   Problem: Safety: Goal: Ability to remain free from injury will improve Outcome: Progressing   

## 2024-10-03 NOTE — TOC Transition Note (Signed)
 Transition of Care [facility]) - Discharge Note   Patient Details  Name: [name] [name] MRN: [id number] Date of Birth: 10/03/1945  [facility]) CM/SW Contact:  Bridget Cordella Simmonds, LCSW Phone Number: 10/03/2024, 12:55 PM   Clinical Narrative:   Pt discharging to [facility], room 509. RN call report to [phone].     Final next level of care: Skilled Nursing Facility Barriers to Discharge: Barriers Resolved   Patient Goals and CMS Choice Patient states their goals for this hospitalization and ongoing recovery are:: normal life CMS Medicare.gov Compare Post Acute Care list provided to:: Patient Represenative (must comment) (brother Gerlene)        Discharge Placement              Patient chooses bed at: [facility] Patient to be transferred to facility by: [facility]tar Name of family member notified: 2 brothers, niece all in room Patient and family notified of of transfer: 10/03/24  Discharge Plan and Services Additional resources added to the After Visit Summary for   In-house Referral: Clinical Social Work   Post Acute Care Choice: Skilled Nursing Facility                               Social Drivers of Health (SDOH) Interventions SDOH Screenings   Food Insecurity: No Food Insecurity (09/25/2024)  Housing: Low Risk (09/25/2024)  Transportation Needs: No Transportation Needs (09/25/2024)  Utilities: Not At Risk (09/25/2024)  Social Connections: Socially Isolated (09/25/2024)  Tobacco Use: Medium Risk (09/25/2024)     Readmission Risk Interventions     No data to display

## 2024-10-03 NOTE — Progress Notes (Signed)
 Speech Language Pathology Treatment: Cognitive-Linguistic  Patient Details Name: [name] [name] MRN: [id number] DOB: 10/29/1945 Today's Date: 10/03/2024 Time: 1335-1400 SLP Time Calculation (min) (ACUTE ONLY): 25 min  Assessment / Plan / Recommendation Clinical Impression  Recommend f/u at next venue of care [facility]) SNF for continued dysarthria/cognitive/linguistic tx.  ST will s/o in acute setting at this time.  Pt seen for a f/u dysarthria/cognitive-linguistic tx focusing on utilizing compensatory strategies for improving cumulative speech intelligibility from word-conversation level.  Speech judged to be 50-74% accurate at the conversation level and improved to 75-100% accuracy with words/shorter phrases and implementation of slow, paced, increased intensity compensatory strategies in place with min verbal/visual cues provided by SLP.  Pt noted prior memory recall deficits stating I was forgetful, but continued to be able to live independently with family A prn for meals/household tasks (pz:[id number], yard work, catering manager.).  Pt demonstrated adequate intellectual awareness and provided detailed information regarding this hospital stay.  Problem solving simple situations within environment accurate during session, but complex tasks proved difficult without min-mod cueing provided by SLP. Further cognitive assessment may be indicated if symptoms persist.  Recommend ST f/u at SNF for above mentioned needs.  HPI HPI: 79 y.o. male presents 09/24/24 with c/o fever, generalized weakness, and brain fog since 09/07/24. Seen at [facility] ED found to have hypotension and AKI. Pt with c/o back pain, MRIs performed of the thoracic and lumbar spine without evidence of infection. Blood cultures now positive for Enterococcus faecalis. 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  ST f/u for cognitive/linguistic and dysarthria tx per speech/language cognitive assessment identifying deficits in these areas.      SLP Plan  Discharge SLP treatment due to (comment) (imminent d/c)        Swallow Evaluation Recommendations   Supervision: Other (comment) (TBD)     Recommendations   F/u at SNF for ST for dysarthria tx/cognitive/linguistic deficits                      Intermittent Supervision/Assistance Dysarthria and anarthria (R47.1);Attention and concentration deficit Cerebral infarction   Discharge SLP treatment due to (comment) (imminent d/c)     Pat Myia Bergh,M.S.,CCC-SLP  10/03/2024, 2:16 PM

## 2024-10-27 ENCOUNTER — Ambulatory Visit: Payer: Self-pay | Admitting: Infectious Disease

## 2024-10-30 ENCOUNTER — Ambulatory Visit: Admitting: Cardiovascular Disease

## 2024-11-06 ENCOUNTER — Ambulatory Visit: Payer: Self-pay | Admitting: Family

## 2025-02-16 ENCOUNTER — Encounter

## 2025-05-19 ENCOUNTER — Encounter

## 2025-08-18 ENCOUNTER — Encounter

## 2025-11-17 ENCOUNTER — Encounter

## 2026-02-16 ENCOUNTER — Encounter
